# Patient Record
Sex: Male | Born: 1970 | Race: White | Hispanic: No | Smoking: Never smoker
Health system: Southern US, Community
[De-identification: ages and names within clinical notes are randomized; demographics above are authoritative.]

## PROBLEM LIST (undated history)

## (undated) DIAGNOSIS — E119 Type 2 diabetes mellitus without complications: Secondary | ICD-10-CM

## (undated) DIAGNOSIS — I10 Essential (primary) hypertension: Secondary | ICD-10-CM

## (undated) DIAGNOSIS — E785 Hyperlipidemia, unspecified: Secondary | ICD-10-CM

## (undated) DIAGNOSIS — K859 Acute pancreatitis without necrosis or infection, unspecified: Secondary | ICD-10-CM

## (undated) DIAGNOSIS — M459 Ankylosing spondylitis of unspecified sites in spine: Secondary | ICD-10-CM

## (undated) DIAGNOSIS — Z87442 Personal history of urinary calculi: Secondary | ICD-10-CM

## (undated) HISTORY — PX: TONSILLECTOMY: SUR1361

## (undated) HISTORY — PX: VASECTOMY: SHX75

## (undated) HISTORY — PX: VASECTOMY REVERSAL: SHX243

---

## 1994-02-19 DIAGNOSIS — N411 Chronic prostatitis: Secondary | ICD-10-CM | POA: Insufficient documentation

## 2015-11-24 ENCOUNTER — Ambulatory Visit (INDEPENDENT_AMBULATORY_CARE_PROVIDER_SITE_OTHER): Payer: 59 | Admitting: Diagnostic Neuroimaging

## 2015-11-24 ENCOUNTER — Encounter (INDEPENDENT_AMBULATORY_CARE_PROVIDER_SITE_OTHER): Payer: Self-pay | Admitting: Diagnostic Neuroimaging

## 2015-11-24 DIAGNOSIS — Z0289 Encounter for other administrative examinations: Secondary | ICD-10-CM

## 2015-11-24 DIAGNOSIS — M79601 Pain in right arm: Secondary | ICD-10-CM

## 2015-11-24 NOTE — Procedures (Signed)
   [facility]  NCS (NERVE CONDUCTION STUDY) WITH EMG (ELECTROMYOGRAPHY) REPORT   STUDY DATE: 11/24/15 PATIENT NAME: Jonathan Melendez DOB: 07/15/1970 MRN: [id number]  ORDERING CLINICIAN: Andria Frames, MD  TECHNOLOGIST: Laretta Alstrom  ELECTROMYOGRAPHER: Earlean Polka. Penumalli, MD  CLINICAL INFORMATION: 45 year old male with right arm numbness and pain since August 2017. Patient reports numbness and tingling in the right hand digits 1-3. Symptoms are gradually improving. Patient has history of "pinched nerve" in the neck several years ago which improved.   FINDINGS: NERVE CONDUCTION STUDY: Bilateral median and ulnar motor responses and F wave latencies are normal. Bilateral median and ulnar sensory responses are normal.    NEEDLE ELECTROMYOGRAPHY: Needle examination of right upper extremity deltoid, biceps, flexor carpi radialis and first dorsal interosseous muscles is normal. Right triceps examination shows 1+ positive sharp waves and fibrillation potentials with increased insertional activity at rest and normal motor unit recruitment on exertion.  Right C5-6 paraspinal muscles are normal.  Right C7-T1 paraspinal muscles shows increased Associates activity and rare fibrillation potentials and fasciculations.   IMPRESSION:  Mildly abnormal study damaging: 1. Mild evidence of right cervical radiculopathy (C7 root). 2. No evidence of underlying large fiber neuropathy or carpal tunnel syndrome.    INTERPRETING PHYSICIAN:  Penni Bombard, MD Certified in Neurology, Neurophysiology and Neuroimaging  [facility] [address], [place], [place] [address] [phone]

## 2015-12-01 ENCOUNTER — Encounter: Payer: 59 | Admitting: Diagnostic Neuroimaging

## 2016-03-13 DIAGNOSIS — R05 Cough: Secondary | ICD-10-CM | POA: Diagnosis not present

## 2016-05-17 DIAGNOSIS — E119 Type 2 diabetes mellitus without complications: Secondary | ICD-10-CM | POA: Diagnosis not present

## 2016-05-17 DIAGNOSIS — Z7984 Long term (current) use of oral hypoglycemic drugs: Secondary | ICD-10-CM | POA: Diagnosis not present

## 2016-05-17 DIAGNOSIS — E785 Hyperlipidemia, unspecified: Secondary | ICD-10-CM | POA: Diagnosis not present

## 2016-05-17 DIAGNOSIS — I10 Essential (primary) hypertension: Secondary | ICD-10-CM | POA: Diagnosis not present

## 2016-07-30 DIAGNOSIS — E119 Type 2 diabetes mellitus without complications: Secondary | ICD-10-CM | POA: Diagnosis not present

## 2016-07-30 DIAGNOSIS — I10 Essential (primary) hypertension: Secondary | ICD-10-CM | POA: Diagnosis not present

## 2016-07-31 DIAGNOSIS — E119 Type 2 diabetes mellitus without complications: Secondary | ICD-10-CM | POA: Diagnosis not present

## 2016-08-30 DIAGNOSIS — E119 Type 2 diabetes mellitus without complications: Secondary | ICD-10-CM | POA: Diagnosis not present

## 2016-08-30 DIAGNOSIS — I10 Essential (primary) hypertension: Secondary | ICD-10-CM | POA: Diagnosis not present

## 2016-08-30 DIAGNOSIS — R109 Unspecified abdominal pain: Secondary | ICD-10-CM | POA: Diagnosis not present

## 2016-08-30 DIAGNOSIS — E785 Hyperlipidemia, unspecified: Secondary | ICD-10-CM | POA: Diagnosis not present

## 2016-09-04 ENCOUNTER — Other Ambulatory Visit: Payer: Self-pay | Admitting: Family Medicine

## 2016-09-04 ENCOUNTER — Other Ambulatory Visit: Payer: Self-pay

## 2016-09-04 DIAGNOSIS — K859 Acute pancreatitis without necrosis or infection, unspecified: Secondary | ICD-10-CM

## 2016-09-05 ENCOUNTER — Ambulatory Visit
Admission: RE | Admit: 2016-09-05 | Discharge: 2016-09-05 | Disposition: A | Discharge status: Home / Self Care | Payer: 59 | Source: Ambulatory Visit | Attending: Family Medicine | Admitting: Family Medicine

## 2016-09-05 ENCOUNTER — Other Ambulatory Visit: Payer: Self-pay

## 2016-09-05 DIAGNOSIS — K760 Fatty (change of) liver, not elsewhere classified: Secondary | ICD-10-CM | POA: Diagnosis not present

## 2016-09-05 DIAGNOSIS — K449 Diaphragmatic hernia without obstruction or gangrene: Secondary | ICD-10-CM | POA: Diagnosis not present

## 2016-09-05 DIAGNOSIS — K859 Acute pancreatitis without necrosis or infection, unspecified: Secondary | ICD-10-CM

## 2016-09-05 MED ORDER — IOPAMIDOL (ISOVUE-300) INJECTION 61%
100.0000 mL | Freq: Once | INTRAVENOUS | Status: AC | PRN
  Administered 2016-09-05: 100 mL via INTRAVENOUS

## 2016-09-13 DIAGNOSIS — R935 Abnormal findings on diagnostic imaging of other abdominal regions, including retroperitoneum: Secondary | ICD-10-CM | POA: Diagnosis not present

## 2016-09-24 DIAGNOSIS — K5730 Diverticulosis of large intestine without perforation or abscess without bleeding: Secondary | ICD-10-CM | POA: Diagnosis not present

## 2016-09-24 DIAGNOSIS — R933 Abnormal findings on diagnostic imaging of other parts of digestive tract: Secondary | ICD-10-CM | POA: Diagnosis not present

## 2016-11-30 DIAGNOSIS — E785 Hyperlipidemia, unspecified: Secondary | ICD-10-CM | POA: Diagnosis not present

## 2016-11-30 DIAGNOSIS — E119 Type 2 diabetes mellitus without complications: Secondary | ICD-10-CM | POA: Diagnosis not present

## 2016-11-30 DIAGNOSIS — Z125 Encounter for screening for malignant neoplasm of prostate: Secondary | ICD-10-CM | POA: Diagnosis not present

## 2016-11-30 DIAGNOSIS — Z0000 Encounter for general adult medical examination without abnormal findings: Secondary | ICD-10-CM | POA: Diagnosis not present

## 2017-01-01 DIAGNOSIS — I10 Essential (primary) hypertension: Secondary | ICD-10-CM | POA: Diagnosis not present

## 2017-01-01 DIAGNOSIS — E7800 Pure hypercholesterolemia, unspecified: Secondary | ICD-10-CM | POA: Diagnosis not present

## 2017-01-01 DIAGNOSIS — E1165 Type 2 diabetes mellitus with hyperglycemia: Secondary | ICD-10-CM | POA: Diagnosis not present

## 2017-01-21 DIAGNOSIS — D3141 Benign neoplasm of right ciliary body: Secondary | ICD-10-CM | POA: Diagnosis not present

## 2017-01-21 DIAGNOSIS — D3132 Benign neoplasm of left choroid: Secondary | ICD-10-CM | POA: Diagnosis not present

## 2017-01-21 DIAGNOSIS — E119 Type 2 diabetes mellitus without complications: Secondary | ICD-10-CM | POA: Diagnosis not present

## 2017-04-11 DIAGNOSIS — I10 Essential (primary) hypertension: Secondary | ICD-10-CM | POA: Diagnosis not present

## 2017-04-11 DIAGNOSIS — E7800 Pure hypercholesterolemia, unspecified: Secondary | ICD-10-CM | POA: Diagnosis not present

## 2017-04-11 DIAGNOSIS — E1165 Type 2 diabetes mellitus with hyperglycemia: Secondary | ICD-10-CM | POA: Diagnosis not present

## 2017-05-22 DIAGNOSIS — M25511 Pain in right shoulder: Secondary | ICD-10-CM | POA: Diagnosis not present

## 2017-05-23 DIAGNOSIS — M25512 Pain in left shoulder: Secondary | ICD-10-CM | POA: Diagnosis not present

## 2017-05-23 DIAGNOSIS — M25511 Pain in right shoulder: Secondary | ICD-10-CM | POA: Diagnosis not present

## 2017-05-23 DIAGNOSIS — M7542 Impingement syndrome of left shoulder: Secondary | ICD-10-CM | POA: Diagnosis not present

## 2017-06-24 DIAGNOSIS — M7541 Impingement syndrome of right shoulder: Secondary | ICD-10-CM | POA: Diagnosis not present

## 2017-06-24 DIAGNOSIS — M7542 Impingement syndrome of left shoulder: Secondary | ICD-10-CM | POA: Diagnosis not present

## 2017-06-24 DIAGNOSIS — S46912D Strain of unspecified muscle, fascia and tendon at shoulder and upper arm level, left arm, subsequent encounter: Secondary | ICD-10-CM | POA: Diagnosis not present

## 2017-06-24 DIAGNOSIS — S46912A Strain of unspecified muscle, fascia and tendon at shoulder and upper arm level, left arm, initial encounter: Secondary | ICD-10-CM | POA: Insufficient documentation

## 2017-06-28 DIAGNOSIS — M25512 Pain in left shoulder: Secondary | ICD-10-CM | POA: Diagnosis not present

## 2017-07-05 DIAGNOSIS — S46912D Strain of unspecified muscle, fascia and tendon at shoulder and upper arm level, left arm, subsequent encounter: Secondary | ICD-10-CM | POA: Diagnosis not present

## 2017-07-05 DIAGNOSIS — M7502 Adhesive capsulitis of left shoulder: Secondary | ICD-10-CM | POA: Diagnosis not present

## 2017-07-23 DIAGNOSIS — S46912D Strain of unspecified muscle, fascia and tendon at shoulder and upper arm level, left arm, subsequent encounter: Secondary | ICD-10-CM | POA: Diagnosis not present

## 2017-07-26 DIAGNOSIS — S46912D Strain of unspecified muscle, fascia and tendon at shoulder and upper arm level, left arm, subsequent encounter: Secondary | ICD-10-CM | POA: Diagnosis not present

## 2017-07-30 DIAGNOSIS — S46912D Strain of unspecified muscle, fascia and tendon at shoulder and upper arm level, left arm, subsequent encounter: Secondary | ICD-10-CM | POA: Diagnosis not present

## 2017-08-02 DIAGNOSIS — S46912D Strain of unspecified muscle, fascia and tendon at shoulder and upper arm level, left arm, subsequent encounter: Secondary | ICD-10-CM | POA: Diagnosis not present

## 2017-08-07 DIAGNOSIS — S46912D Strain of unspecified muscle, fascia and tendon at shoulder and upper arm level, left arm, subsequent encounter: Secondary | ICD-10-CM | POA: Diagnosis not present

## 2017-08-14 DIAGNOSIS — S46912D Strain of unspecified muscle, fascia and tendon at shoulder and upper arm level, left arm, subsequent encounter: Secondary | ICD-10-CM | POA: Diagnosis not present

## 2017-08-14 DIAGNOSIS — M7502 Adhesive capsulitis of left shoulder: Secondary | ICD-10-CM | POA: Diagnosis not present

## 2017-08-16 DIAGNOSIS — S46912D Strain of unspecified muscle, fascia and tendon at shoulder and upper arm level, left arm, subsequent encounter: Secondary | ICD-10-CM | POA: Diagnosis not present

## 2017-08-21 DIAGNOSIS — S46912D Strain of unspecified muscle, fascia and tendon at shoulder and upper arm level, left arm, subsequent encounter: Secondary | ICD-10-CM | POA: Diagnosis not present

## 2017-11-01 DIAGNOSIS — Z0000 Encounter for general adult medical examination without abnormal findings: Secondary | ICD-10-CM | POA: Diagnosis not present

## 2017-11-01 DIAGNOSIS — Z23 Encounter for immunization: Secondary | ICD-10-CM | POA: Diagnosis not present

## 2017-11-01 DIAGNOSIS — E785 Hyperlipidemia, unspecified: Secondary | ICD-10-CM | POA: Diagnosis not present

## 2017-11-04 DIAGNOSIS — I10 Essential (primary) hypertension: Secondary | ICD-10-CM | POA: Diagnosis not present

## 2017-11-04 DIAGNOSIS — E1165 Type 2 diabetes mellitus with hyperglycemia: Secondary | ICD-10-CM | POA: Diagnosis not present

## 2017-11-04 DIAGNOSIS — E7800 Pure hypercholesterolemia, unspecified: Secondary | ICD-10-CM | POA: Diagnosis not present

## 2017-11-18 DIAGNOSIS — S46912D Strain of unspecified muscle, fascia and tendon at shoulder and upper arm level, left arm, subsequent encounter: Secondary | ICD-10-CM | POA: Diagnosis not present

## 2017-11-18 DIAGNOSIS — M7502 Adhesive capsulitis of left shoulder: Secondary | ICD-10-CM | POA: Diagnosis not present

## 2017-11-18 DIAGNOSIS — M7500 Adhesive capsulitis of unspecified shoulder: Secondary | ICD-10-CM | POA: Insufficient documentation

## 2017-12-24 DIAGNOSIS — G43009 Migraine without aura, not intractable, without status migrainosus: Secondary | ICD-10-CM | POA: Diagnosis not present

## 2017-12-24 DIAGNOSIS — E162 Hypoglycemia, unspecified: Secondary | ICD-10-CM | POA: Diagnosis not present

## 2018-02-25 DIAGNOSIS — R5382 Chronic fatigue, unspecified: Secondary | ICD-10-CM | POA: Diagnosis not present

## 2018-02-25 DIAGNOSIS — M459 Ankylosing spondylitis of unspecified sites in spine: Secondary | ICD-10-CM | POA: Diagnosis not present

## 2018-03-14 DIAGNOSIS — E7800 Pure hypercholesterolemia, unspecified: Secondary | ICD-10-CM | POA: Diagnosis not present

## 2018-03-14 DIAGNOSIS — E1165 Type 2 diabetes mellitus with hyperglycemia: Secondary | ICD-10-CM | POA: Diagnosis not present

## 2018-03-14 DIAGNOSIS — I10 Essential (primary) hypertension: Secondary | ICD-10-CM | POA: Diagnosis not present

## 2018-03-19 DIAGNOSIS — M461 Sacroiliitis, not elsewhere classified: Secondary | ICD-10-CM | POA: Diagnosis not present

## 2018-03-19 DIAGNOSIS — M2550 Pain in unspecified joint: Secondary | ICD-10-CM | POA: Diagnosis not present

## 2018-03-19 DIAGNOSIS — M450 Ankylosing spondylitis of multiple sites in spine: Secondary | ICD-10-CM | POA: Diagnosis not present

## 2018-05-02 ENCOUNTER — Encounter: Payer: Self-pay | Admitting: Cardiovascular Disease

## 2018-05-02 DIAGNOSIS — I10 Essential (primary) hypertension: Secondary | ICD-10-CM | POA: Diagnosis not present

## 2018-05-02 DIAGNOSIS — E785 Hyperlipidemia, unspecified: Secondary | ICD-10-CM | POA: Diagnosis not present

## 2018-05-02 DIAGNOSIS — E119 Type 2 diabetes mellitus without complications: Secondary | ICD-10-CM | POA: Diagnosis not present

## 2018-06-10 DIAGNOSIS — M2550 Pain in unspecified joint: Secondary | ICD-10-CM | POA: Diagnosis not present

## 2018-06-10 DIAGNOSIS — M461 Sacroiliitis, not elsewhere classified: Secondary | ICD-10-CM | POA: Diagnosis not present

## 2018-06-10 DIAGNOSIS — M450 Ankylosing spondylitis of multiple sites in spine: Secondary | ICD-10-CM | POA: Diagnosis not present

## 2018-07-22 ENCOUNTER — Other Ambulatory Visit: Payer: Self-pay | Admitting: Internal Medicine

## 2018-07-22 DIAGNOSIS — M542 Cervicalgia: Secondary | ICD-10-CM

## 2018-08-08 ENCOUNTER — Other Ambulatory Visit: Payer: 59

## 2019-05-09 ENCOUNTER — Ambulatory Visit: Payer: Self-pay | Attending: Internal Medicine

## 2019-05-09 DIAGNOSIS — Z23 Encounter for immunization: Secondary | ICD-10-CM

## 2019-05-09 NOTE — Progress Notes (Signed)
   Covid-19 Vaccination Clinic  Name:  Jonathan Melendez    MRN: [id number] DOB: Jun 30, 1970  05/09/2019  Mr. Jonathan Melendez was observed post Covid-19 immunization for 15 minutes without incident. He was provided with Vaccine Information Sheet and instruction to access the V-Safe system.   Mr. Jonathan Melendez was instructed to call 911 with any severe reactions post vaccine: Marland Kitchen Difficulty breathing  . Swelling of face and throat  . A fast heartbeat  . A bad rash all over body  . Dizziness and weakness   Immunizations Administered    Name Date Dose VIS Date Route   Moderna COVID-19 Vaccine 05/09/2019  9:25 AM 0.5 mL 01/20/2019 Intramuscular   Manufacturer: Moderna   Lot: [id number]   [id number]

## 2019-06-10 ENCOUNTER — Ambulatory Visit: Payer: Self-pay | Attending: Internal Medicine

## 2019-06-10 DIAGNOSIS — Z23 Encounter for immunization: Secondary | ICD-10-CM

## 2019-06-10 NOTE — Progress Notes (Signed)
   Covid-19 Vaccination Clinic  Name:  Jonathan Melendez    MRN: [id number] DOB: 01/30/1971  06/10/2019  Mr. [name] was observed post Covid-19 immunization for 15 minutes without incident. He was provided with Vaccine Information Sheet and instruction to access the V-Safe system.   Mr. [name] was instructed to call 911 with any severe reactions post vaccine: Marland Kitchen Difficulty breathing  . Swelling of face and throat  . A fast heartbeat  . A bad rash all over body  . Dizziness and weakness   Immunizations Administered    Name Date Dose VIS Date Route   Moderna COVID-19 Vaccine 06/10/2019  8:38 AM 0.5 mL 01/2019 Intramuscular   Manufacturer: Moderna   Lot: [id number]   [id number]

## 2019-07-06 ENCOUNTER — Encounter (HOSPITAL_COMMUNITY): Payer: Self-pay

## 2019-07-06 ENCOUNTER — Other Ambulatory Visit: Payer: Self-pay

## 2019-07-06 ENCOUNTER — Emergency Department (HOSPITAL_COMMUNITY): Payer: 59

## 2019-07-06 ENCOUNTER — Emergency Department (HOSPITAL_COMMUNITY)
Admission: EM | Admit: 2019-07-06 | Discharge: 2019-07-06 | Disposition: A | Discharge status: Home / Self Care | Payer: 59 | Attending: Emergency Medicine | Admitting: Emergency Medicine

## 2019-07-06 DIAGNOSIS — I10 Essential (primary) hypertension: Secondary | ICD-10-CM | POA: Insufficient documentation

## 2019-07-06 DIAGNOSIS — R739 Hyperglycemia, unspecified: Secondary | ICD-10-CM

## 2019-07-06 DIAGNOSIS — R519 Headache, unspecified: Secondary | ICD-10-CM

## 2019-07-06 DIAGNOSIS — R0789 Other chest pain: Secondary | ICD-10-CM | POA: Insufficient documentation

## 2019-07-06 DIAGNOSIS — Z7984 Long term (current) use of oral hypoglycemic drugs: Secondary | ICD-10-CM | POA: Diagnosis not present

## 2019-07-06 DIAGNOSIS — E1165 Type 2 diabetes mellitus with hyperglycemia: Secondary | ICD-10-CM | POA: Insufficient documentation

## 2019-07-06 DIAGNOSIS — Z79899 Other long term (current) drug therapy: Secondary | ICD-10-CM | POA: Insufficient documentation

## 2019-07-06 DIAGNOSIS — R079 Chest pain, unspecified: Secondary | ICD-10-CM

## 2019-07-06 HISTORY — DX: Type 2 diabetes mellitus without complications: E11.9

## 2019-07-06 HISTORY — DX: Essential (primary) hypertension: I10

## 2019-07-06 LAB — CBC
HCT: 51.5 % (ref 39.0–52.0)
Hemoglobin: 17.5 g/dL — ABNORMAL HIGH (ref 13.0–17.0)
MCH: 29.7 pg (ref 26.0–34.0)
MCHC: 34 g/dL (ref 30.0–36.0)
MCV: 87.3 fL (ref 80.0–100.0)
Platelets: 214 K/uL (ref 150–400)
RBC: 5.9 MIL/uL — ABNORMAL HIGH (ref 4.22–5.81)
RDW: 12 % (ref 11.5–15.5)
WBC: 7.5 K/uL (ref 4.0–10.5)
nRBC: 0 % (ref 0.0–0.2)

## 2019-07-06 LAB — BASIC METABOLIC PANEL WITH GFR
Anion gap: 16 — ABNORMAL HIGH (ref 5–15)
BUN: 8 mg/dL (ref 6–20)
CO2: 22 mmol/L (ref 22–32)
Calcium: 9.4 mg/dL (ref 8.9–10.3)
Chloride: 101 mmol/L (ref 98–111)
Creatinine, Ser: 1.06 mg/dL (ref 0.61–1.24)
GFR calc Af Amer: >60 mL/min (ref >60)
GFR calc non Af Amer: >60 mL/min (ref >60)
Glucose, Bld: 278 mg/dL — ABNORMAL HIGH (ref 70–99)
Potassium: 4.1 mmol/L (ref 3.5–5.1)
Sodium: 139 mmol/L (ref 135–145)

## 2019-07-06 LAB — TROPONIN I (HIGH SENSITIVITY)
Troponin I (High Sensitivity): 6 ng/L (ref <18)
Troponin I (High Sensitivity): 5 ng/L (ref <18)

## 2019-07-06 MED ORDER — PROCHLORPERAZINE EDISYLATE 10 MG/2ML IJ SOLN
10.0000 mg | Freq: Once | INTRAMUSCULAR | Status: AC
  Administered 2019-07-06: 10 mg via INTRAVENOUS
  Filled 2019-07-06: qty 2

## 2019-07-06 MED ORDER — SODIUM CHLORIDE 0.9 % IV BOLUS
1000.0000 mL | Freq: Once | INTRAVENOUS | Status: AC
  Administered 2019-07-06: 1000 mL via INTRAVENOUS

## 2019-07-06 MED ORDER — DIPHENHYDRAMINE HCL 50 MG/ML IJ SOLN
12.5000 mg | Freq: Once | INTRAMUSCULAR | Status: AC
  Administered 2019-07-06: 12.5 mg via INTRAVENOUS
  Filled 2019-07-06: qty 1

## 2019-07-06 NOTE — Discharge Instructions (Addendum)
You were seen in the emergency department today for a headache & chest pain. Your work-up in the emergency department has been overall reassuring. Your labs have been fairly normal and or similar to previous blood work you have had done.-Your blood sugar was noted to be elevated greater than 200, please be sure to monitor this at home and have it rechecked by your primary care provider.  Your EKG and the enzyme we use to check your heart did not show an acute heart attack at this time. Your chest x-ray was normal.  CT of your head was normal.  Please take Motrin or Tylenol per over-the-counter dosing to help should pain return.  We would like you to follow up closely with your primary care provider and/or the cardiologist provided in your discharge instructions within 1-3 days. Return to the ER immediately should you experience any new or worsening symptoms including but not limited to return of pain, worsened pain, vomiting, shortness of breath, dizziness, lightheadedness, passing out, or any other concerns that you may have.

## 2019-07-06 NOTE — ED Triage Notes (Signed)
Pt c.o chest pressure for the past few months but worsening over the past few days along with headache. Pt a.o, nad noted

## 2019-07-06 NOTE — ED Provider Notes (Signed)
[facility] EMERGENCY DEPARTMENT Provider Note   CSN: [id number] Arrival date & time: 07/06/19  [id number]     History Chief Complaint  Patient presents with  . Chest Pain  . Headache    Jonathan Melendez is a 49 y.o. male with a history of hypertension & DM who presents to the ED with complaints of intermittent chest pain and headache for the past 2 to 3 weeks.  Patient states chest discomfort occurs almost daily, it is described as a pressure to the central chest that occurs intermittently, typically lasting about 1 hour at a time, no specific alleviating or aggravating factors.  No change with food, exertion, or deep breathing.  Headache has been occurring almost daily, states he has gradual onset steady progression of discomfort to the generalized frontal/back of the head.  When the pain gets really bad he does have some mild blurry vision in both of his eyes, is not persistent.  Discomfort is mildly alleviated with aspirin.  No other alleviating or rating factors.  Patient denies fever, nausea, vomiting, diaphoresis, dyspnea, syncope, leg pain/swelling, hemoptysis, recent surgery/trauma, recent long travel, hormone use, personal hx of cancer, or hx of DVT/PE.  Family history of CAD, his father had a heart attack at age 40.     HPI     Past Medical History:  Diagnosis Date  . Diabetes mellitus without complication ([place])   . Hypertension     There are no problems to display for this patient.   History reviewed. No pertinent surgical history.     No family history on file.  Social History   Tobacco Use  . Smoking status: Not on file  Substance Use Topics  . Alcohol use: Not on file  . Drug use: Not on file    Home Medications Prior to Admission medications   Not on File    Allergies    Patient has no allergy information on record.  Review of Systems   Review of Systems  Constitutional: Negative for chills and fever.  Eyes: Positive for visual  disturbance (mild intermittent blurry vision).  Respiratory: Negative for cough and shortness of breath.   Cardiovascular: Positive for chest pain.  Gastrointestinal: Negative for abdominal pain, nausea and vomiting.  Neurological: Positive for headaches. Negative for dizziness, syncope, facial asymmetry, speech difficulty, weakness and numbness.  All other systems reviewed and are negative.   Physical Exam Updated Vital Signs BP (!) 173/103   Pulse 99   Temp 98 F (36.7 C) (Oral)   Resp 16   Ht 5\' 8"  (1.727 m)   Wt 86.2 kg   SpO2 99%   BMI 28.89 kg/m   Physical Exam Vitals and nursing note reviewed.  Constitutional:      General: He is not in acute distress.    Appearance: Normal appearance. He is not toxic-appearing.  HENT:     Head: Normocephalic and atraumatic.     Mouth/Throat:     Pharynx: Oropharynx is clear. Uvula midline.  Eyes:     General: Vision grossly intact. Gaze aligned appropriately.     Extraocular Movements: Extraocular movements intact.     Conjunctiva/sclera: Conjunctivae normal.     Pupils: Pupils are equal, round, and reactive to light.     Comments: No proptosis.   Cardiovascular:     Rate and Rhythm: Normal rate and regular rhythm.     Pulses:          Radial pulses are 2+ on the right  side and 2+ on the left side.  Pulmonary:     Effort: Pulmonary effort is normal.     Breath sounds: Normal breath sounds.  Abdominal:     General: There is no distension.     Palpations: Abdomen is soft.     Tenderness: There is no abdominal tenderness. There is no guarding or rebound.  Musculoskeletal:     Cervical back: Normal range of motion and neck supple. No rigidity.     Right lower leg: No tenderness. No edema.     Left lower leg: No tenderness. No edema.  Skin:    General: Skin is warm and dry.  Neurological:     Mental Status: He is alert.     Comments: Alert. Clear speech. No facial droop. CNIII-XII grossly intact. Bilateral upper and lower  extremities' sensation grossly intact. 5/5 symmetric strength with grip strength and with plantar and dorsi flexion bilaterally . Normal finger to nose bilaterally. Negative pronator drift. Gait intact.    Psychiatric:        Mood and Affect: Mood normal.        Behavior: Behavior normal.     ED Results / Procedures / Treatments   Labs (all labs ordered are listed, but only abnormal results are displayed) Labs Reviewed  BASIC METABOLIC PANEL - Abnormal; Notable for the following components:      Result Value   Glucose, Bld 278 (*)    Anion gap 16 (*)    All other components within normal limits  CBC - Abnormal; Notable for the following components:   RBC 5.90 (*)    Hemoglobin 17.5 (*)    All other components within normal limits  TROPONIN I (HIGH SENSITIVITY)  TROPONIN I (HIGH SENSITIVITY)    EKG EKG Interpretation  Date/Time:  Monday Jul 06 2019 08:36:58 EDT Ventricular Rate:  95 PR Interval:  136 QRS Duration: 92 QT Interval:  368 QTC Calculation: 462 R Axis:   106 Text Interpretation: Normal sinus rhythm Rightward axis Borderline ECG Confirmed by Lennice Sites [phone]) on 07/06/2019 10:05:11 AM   Radiology DG Chest 2 View  Result Date: 07/06/2019 CLINICAL DATA:  Chest pain EXAM: CHEST - 2 VIEW COMPARISON:  None. FINDINGS: The heart size and mediastinal contours are within normal limits. Both lungs are clear. No pleural effusion or pneumothorax. The visualized skeletal structures are unremarkable. IMPRESSION: No acute process in the chest. Electronically Signed   By: Macy Mis M.D.   On: 07/06/2019 09:22   CT Head Wo Contrast  Result Date: 07/06/2019 CLINICAL DATA:  Headaches and head pressure for several weeks, no injuries, diabetes mellitus, hypertension EXAM: CT HEAD WITHOUT CONTRAST TECHNIQUE: Contiguous axial images were obtained from the base of the skull through the vertex without intravenous contrast. Sagittal and coronal MPR images reconstructed from axial  data set. COMPARISON:  None. FINDINGS: Brain: Normal ventricular morphology. No midline shift or mass effect. Normal appearance of brain parenchyma. No intracranial hemorrhage, mass lesion, evidence of acute infarction, or extra-axial fluid collection. Vascular: No hyperdense vessels Skull: Intact Sinuses/Orbits: Clear Other: N/A IMPRESSION: Normal exam. Electronically Signed   By: Lavonia Dana M.D.   On: 07/06/2019 11:47    Procedures Procedures (including critical care time)  Medications Ordered in ED Medications  prochlorperazine (COMPAZINE) injection 10 mg (10 mg Intravenous Given 07/06/19 1047)  diphenhydrAMINE (BENADRYL) injection 12.5 mg (12.5 mg Intravenous Given 07/06/19 1048)  sodium chloride 0.9 % bolus 1,000 mL (0 mLs Intravenous Stopped 07/06/19 1224)  ED Course  I have reviewed the triage vital signs and the nursing notes.  Pertinent labs & imaging results that were available during my care of the patient were reviewed by me and considered in my medical decision making (see chart for details).    MDM Rules/Calculators/A&P                     Patient presents to the ED with complaints of chest discomfort & headache intermittently for the past few weesk. Nontoxic, vitals WNL with the exception of elevated BP which is improved on my initial assessment to 140s/90s. Benign physical exam.    Additional history obtained:  Additional history obtained from review of chart & nursing noted.  EKG: Normal sinus rhythm, right axis deviation, no STEMI. Lab Tests:  I Ordered, reviewed, and interpreted labs, which included:  CBC: No anemia.  Elevated hemoglobin-possibly hemoconcentrated.  No leukocytosis. BMP: Hyperglycemia with mildly elevated anion gap, no acidosis. Troponin:6, 5 Imaging Studies ordered:  I ordered imaging studies which included CXR & head CT, I independently visualized and interpreted imaging which showed: CXR:  No acute process in the chest.  CT head : normal exam.    ED Course:  10:15: Initial evaluation of the patient, he is resting comfortably, benign physical exam, states the headache is bothering him more than the chest at this time, will trial migraine cocktail.  13:10: RE-EVAL: Patient is feeling much better.   HEAR score 4-EKG without significant ischemic changes, troponins are flat, symptoms for a couple of weeks now, low suspicion for ACS at this time.  Patient is low risk Wells, doubt pulmonary embolism.  Symmetric pulses, no widened mediastinum on chest x-ray, doubt dissection.  No critical anemia or electrolyte derangement.  Normal chest x-ray.  Overall reassuring chest pain work-up.  Given his family history and risk factors do feel he would benefit from close cardiology follow-up.  Regarding his headache: Given no history of similar headaches in age of 49 years old a CT of the head was obtained and negative for acute process.  Headaches have had gradual onset with steady progression, he has had no focal neurologic deficits on exam, vision is grossly intact, he is afebrile, no nuchal rigidity-overall low suspicion for [facility], ICH, ischemic CVA, dural venous sinus thrombosis, acute glaucoma, giant cell arteritis, mass, or meningitis.  He is feeling improved status post migraine cocktail.    His blood sugar was also noted to be elevated with a mildly elevated anion gap, no acidosis noted, do not suspect DKA.  He was given fluids for this in the ER. We will have him follow-up closely with his primary care provider.  I discussed results, treatment plan, need for follow-up, and return precautions with the patient. Provided opportunity for questions, patient confirmed understanding and is in agreement with plan.   Portions of this note were generated with Lobbyist. Dictation errors may occur despite best attempts at proofreading.  Final Clinical Impression(s) / ED Diagnoses Final diagnoses:  Hyperglycemia  Chest pain, unspecified type   Nonintractable headache, unspecified chronicity pattern, unspecified headache type    Rx / DC Orders ED Discharge Orders    None       Amaryllis Dyke, PA-C 07/06/19 1321    Lennice Sites, DO 07/06/19 1456

## 2019-07-24 ENCOUNTER — Encounter: Payer: Self-pay | Admitting: Cardiovascular Disease

## 2019-07-24 ENCOUNTER — Ambulatory Visit (INDEPENDENT_AMBULATORY_CARE_PROVIDER_SITE_OTHER): Payer: 59 | Admitting: Cardiovascular Disease

## 2019-07-24 ENCOUNTER — Other Ambulatory Visit: Payer: Self-pay

## 2019-07-24 VITALS — BP 130/84 | HR 51 | Ht 68.0 in | Wt 188.6 lb

## 2019-07-24 DIAGNOSIS — Z8249 Family history of ischemic heart disease and other diseases of the circulatory system: Secondary | ICD-10-CM

## 2019-07-24 DIAGNOSIS — E782 Mixed hyperlipidemia: Secondary | ICD-10-CM

## 2019-07-24 DIAGNOSIS — R072 Precordial pain: Secondary | ICD-10-CM | POA: Diagnosis not present

## 2019-07-24 DIAGNOSIS — R079 Chest pain, unspecified: Secondary | ICD-10-CM | POA: Diagnosis not present

## 2019-07-24 DIAGNOSIS — Z8639 Personal history of other endocrine, nutritional and metabolic disease: Secondary | ICD-10-CM

## 2019-07-24 DIAGNOSIS — I10 Essential (primary) hypertension: Secondary | ICD-10-CM | POA: Diagnosis not present

## 2019-07-24 DIAGNOSIS — E785 Hyperlipidemia, unspecified: Secondary | ICD-10-CM | POA: Insufficient documentation

## 2019-07-24 MED ORDER — NITROGLYCERIN 0.4 MG SL SUBL: 0.4000 mg | SUBLINGUAL_TABLET | SUBLINGUAL | 3 refills | Status: DC | PRN

## 2019-07-24 NOTE — Progress Notes (Signed)
07/24/2019 Jonathan Melendez   Nov 01, 1970  [id number]  Primary Physician London Pepper, MD Primary Cardiologist: Lorretta Harp MD Lupe Carney, [place]  HPI:  Jonathan Melendez is a 49 y.o. thin appearing divorced Caucasian male father of one deceased daughter referred by Dr. Orland Mustard for cardiovascular valuation because of chest pain.  He works at [employer].  His cardiac risk factors are notable for treated hypertension, diabetes and hyperlipidemia.  His father died of a myocardial infarction at age 24.  He is never had a heart attack or stroke.  Does have a history of ankylosing spondylitis.  He was seen in the emergency room 07/06/2019 with chest pain and headache.  His work-up was unrevealing.  He had had symptoms for 2 weeks prior to that which were less severe and several episodes since that time.  The pain occurs randomly, last for minutes at a time and radiates to his left shoulder with some shortness of breath as well.   Current Meds  Medication Sig  . atorvastatin (LIPITOR) 20 MG tablet Take 20 mg by mouth daily.  Marland Kitchen glimepiride (AMARYL) 2 MG tablet Take 2 mg by mouth 2 (two) times daily.  . insulin degludec (TRESIBA FLEXTOUCH) 100 UNIT/ML FlexTouch Pen Inject 8-10 mLs into the skin daily.   . metFORMIN (GLUCOPHAGE-XR) 500 MG 24 hr tablet Take 500 mg by mouth in the morning and at bedtime.   . metoprolol succinate (TOPROL-XL) 50 MG 24 hr tablet Take 50 mg by mouth daily.     No Known Allergies  Social History   Socioeconomic History  . Marital status: Married    Spouse name: Not on file  . Number of children: Not on file  . Years of education: Not on file  . Highest education level: Not on file  Occupational History  . Not on file  Tobacco Use  . Smoking status: Never Smoker  . Smokeless tobacco: Never Used  Substance and Sexual Activity  . Alcohol use: Not on file  . Drug use: Not on file  . Sexual activity: Not on file  Other Topics  Concern  . Not on file  Social History Narrative  . Not on file   Social Determinants of Health   Financial Resource Strain:   . Difficulty of Paying Living Expenses:   Food Insecurity:   . Worried About Charity fundraiser in the Last Year:   . Arboriculturist in the Last Year:   Transportation Needs:   . Film/video editor (Medical):   Marland Kitchen Lack of Transportation (Non-Medical):   Physical Activity:   . Days of Exercise per Week:   . Minutes of Exercise per Session:   Stress:   . Feeling of Stress :   Social Connections:   . Frequency of Communication with Friends and Family:   . Frequency of Social Gatherings with Friends and Family:   . Attends Religious Services:   . Active Member of Clubs or Organizations:   . Attends Archivist Meetings:   Marland Kitchen Marital Status:   Intimate Partner Violence:   . Fear of Current or Ex-Partner:   . Emotionally Abused:   Marland Kitchen Physically Abused:   . Sexually Abused:      Review of Systems: General: negative for chills, fever, night sweats or weight changes.  Cardiovascular: negative for chest pain, dyspnea on exertion, edema, orthopnea, palpitations, paroxysmal nocturnal dyspnea or shortness of breath Dermatological: negative for rash Respiratory: negative  for cough or wheezing Urologic: negative for hematuria Abdominal: negative for nausea, vomiting, diarrhea, bright red blood per rectum, melena, or hematemesis Neurologic: negative for visual changes, syncope, or dizziness All other systems reviewed and are otherwise negative except as noted above.    Blood pressure 130/84, pulse (!) 51, height 5\' 8"  (1.727 m), weight 188 lb 9.6 oz (85.5 kg), SpO2 97 %.  General appearance: alert and no distress Neck: no adenopathy, no carotid bruit, no JVD, supple, symmetrical, trachea midline and thyroid not enlarged, symmetric, no tenderness/mass/nodules Lungs: clear to auscultation bilaterally Heart: regular rate and rhythm, S1, S2 normal,  no murmur, click, rub or gallop Extremities: extremities normal, atraumatic, no cyanosis or edema Pulses: 2+ and symmetric Skin: Skin color, texture, turgor normal. No rashes or lesions Neurologic: Alert and oriented X 3, normal strength and tone. Normal symmetric reflexes. Normal coordination and gait  EKG not performed today  ASSESSMENT AND PLAN:   Essential hypertension History of essential potential blood pressure measured today 130/84.  He is on metoprolol.  Hyperlipidemia History of hyperlipidemia on statin therapy followed by his PCP  Chest pain of uncertain etiology Mr. Tousley was recently seen in the ER 07/06/2019 with chest pain and headaches.  His work-up was unrevealing.  He had had symptoms that began 2 weeks prior to that and has had several episodes since that which were less severe.  The episodes last several minutes at a time and occasionally radiate to the left shoulder.  They are associated with shortness of breath.  He does have a family history for heart disease with a father who had a myocardial infarction at age 105.  He has treated hypertension, diabetes and hyperlipidemia.  I am going to get a 2D echo and a coronary CTA on him.  See him back after that for further evaluation.      Lorretta Harp MD FACP,FACC,FAHA, [facility] 07/24/2019 3:53 PM

## 2019-07-24 NOTE — Assessment & Plan Note (Signed)
History of essential potential blood pressure measured today 130/84.  He is on metoprolol.

## 2019-07-24 NOTE — Assessment & Plan Note (Signed)
History of hyperlipidemia on statin therapy followed by his PCP 

## 2019-07-24 NOTE — Assessment & Plan Note (Signed)
Jonathan Melendez was recently seen in the ER 07/06/2019 with chest pain and headaches.  His work-up was unrevealing.  He had had symptoms that began 2 weeks prior to that and has had several episodes since that which were less severe.  The episodes last several minutes at a time and occasionally radiate to the left shoulder.  They are associated with shortness of breath.  He does have a family history for heart disease with a father who had a myocardial infarction at age 68.  He has treated hypertension, diabetes and hyperlipidemia.  I am going to get a 2D echo and a coronary CTA on him.  See him back after that for further evaluation.

## 2019-07-24 NOTE — Patient Instructions (Addendum)
Medication Instructions:  Your physician recommends that you continue on your current medications as directed. Please refer to the Current Medication list given to you today.  Dr. Gwenlyn Found has prescribed nitroglycerin to use as needed for chest pain   *If you need a refill on your cardiac medications before your next appointment, please call your pharmacy*   Lab Work: BMET - 1 week prior to CT test  If you have labs (blood work) drawn today and your tests are completely normal, you will receive your results only by: Marland Kitchen MyChart Message (if you have MyChart) OR . A paper copy in the mail If you have any lab test that is abnormal or we need to change your treatment, we will call you to review the results.   Testing/Procedures: Echocardiogram @ [facility] - schedule ASAP Coronary CT @ [facility]   Follow-Up: At [facility], you and your health needs are our priority.  As part of our continuing mission to provide you with exceptional heart care, we have created designated Provider Care Teams.  These Care Teams include your primary Cardiologist (physician) and Advanced Practice Providers (APPs -  Physician Assistants and Nurse Practitioners) who all work together to provide you with the care you need, when you need it.  We recommend signing up for the patient portal called "MyChart".  Sign up information is provided on this After Visit Summary.  MyChart is used to connect with patients for Virtual Visits (Telemedicine).  Patients are able to view lab/test results, encounter notes, upcoming appointments, etc.  Non-urgent messages can be sent to your provider as well.   To learn more about what you can do with MyChart, go to [email or web address].    Your next appointment:   ASAP after testing with Dr. Gwenlyn Found    Other Instructions  Your cardiac CT will be scheduled at one of the below locations:   [facility] [address] [place], [place] [address] [phone]  [place] [address] [place], [place] [address] [phone]  If scheduled at [facility], please arrive at the [facility] main entrance of [facility] 30 minutes prior to test start time. Proceed to the [facility] Radiology Department (first floor) to check-in and test prep.  If scheduled at [facility], please arrive 15 mins early for check-in and test prep.  Please follow these instructions carefully (unless otherwise directed):  Hold all erectile dysfunction medications at least 3 days (72 hrs) prior to test.  On the Night Before the Test: . Be sure to Drink plenty of water. . Do not consume any caffeinated/decaffeinated beverages or chocolate 12 hours prior to your test. . Do not take any antihistamines 12 hours prior to your test. . If you take Metformin do not take 24 hours prior to test.   On the Day of the Test: . Drink plenty of water. Do not drink any water within one hour of the test. . Do not eat any food 4 hours prior to the test. . You may take your regular medications prior to the test.       After the Test: . Drink plenty of water. . After receiving IV contrast, you may experience a mild flushed feeling. This is normal. . On occasion, you may experience a mild rash up to 24 hours after the test. This is not dangerous. If this occurs, you can take Benadryl 25 mg and increase  your fluid intake. . If you experience trouble breathing, this can be serious. If it is severe call 911 IMMEDIATELY. If it is mild, please call our office. . If you take any of these medications: Glipizide/Metformin, Avandament, Glucavance, please do not take 48 hours after completing test unless otherwise instructed.   Once we have confirmed authorization from your insurance company, we will call you to set up a date and time for your test.   For non-scheduling related questions,  please contact the cardiac imaging nurse navigator should you have any questions/concerns: Marchia Bond, Cardiac Imaging Nurse Navigator Burley Saver, Interim Cardiac Imaging Nurse [facility] Direct Office Dial: [phone]   For scheduling needs, including cancellations and rescheduling, please call [phone].

## 2019-07-27 ENCOUNTER — Ambulatory Visit (HOSPITAL_COMMUNITY)
Admission: RE | Admit: 2019-07-27 | Discharge: 2019-07-27 | Disposition: A | Discharge status: Home / Self Care | Payer: 59 | Source: Ambulatory Visit | Attending: Cardiovascular Disease | Admitting: Cardiovascular Disease

## 2019-07-27 ENCOUNTER — Other Ambulatory Visit: Payer: Self-pay

## 2019-07-27 DIAGNOSIS — I10 Essential (primary) hypertension: Secondary | ICD-10-CM | POA: Diagnosis present

## 2019-07-27 DIAGNOSIS — Z8249 Family history of ischemic heart disease and other diseases of the circulatory system: Secondary | ICD-10-CM

## 2019-07-27 DIAGNOSIS — Z8639 Personal history of other endocrine, nutritional and metabolic disease: Secondary | ICD-10-CM | POA: Insufficient documentation

## 2019-07-27 DIAGNOSIS — R079 Chest pain, unspecified: Secondary | ICD-10-CM | POA: Diagnosis not present

## 2019-07-27 NOTE — Progress Notes (Signed)
  Echocardiogram 2D Echocardiogram has been performed.  Jonathan Melendez 07/27/2019, 1:51 PM

## 2019-07-31 ENCOUNTER — Ambulatory Visit: Payer: 59 | Admitting: Cardiovascular Disease

## 2019-08-04 ENCOUNTER — Ambulatory Visit: Payer: 59 | Admitting: Cardiovascular Disease

## 2019-08-04 ENCOUNTER — Other Ambulatory Visit: Payer: Self-pay

## 2019-08-04 ENCOUNTER — Encounter: Payer: Self-pay | Admitting: Cardiovascular Disease

## 2019-08-04 VITALS — BP 132/84 | HR 70 | Ht 68.0 in | Wt 188.8 lb

## 2019-08-05 ENCOUNTER — Telehealth: Payer: Self-pay | Admitting: Cardiovascular Disease

## 2019-08-05 ENCOUNTER — Telehealth (HOSPITAL_COMMUNITY): Payer: Self-pay | Admitting: *Deleted

## 2019-08-05 LAB — BASIC METABOLIC PANEL WITH GFR
BUN/Creatinine Ratio: 8 — ABNORMAL LOW (ref 9–20)
BUN: 8 mg/dL (ref 6–24)
CO2: 23 mmol/L (ref 20–29)
Calcium: 10 mg/dL (ref 8.7–10.2)
Chloride: 99 mmol/L (ref 96–106)
Creatinine, Ser: 1.05 mg/dL (ref 0.76–1.27)
GFR calc Af Amer: 95 mL/min/1.73 (ref >59)
GFR calc non Af Amer: 82 mL/min/1.73 (ref >59)
Glucose: 270 mg/dL — ABNORMAL HIGH (ref 65–99)
Potassium: 4.9 mmol/L (ref 3.5–5.2)
Sodium: 139 mmol/L (ref 134–144)

## 2019-08-05 NOTE — Telephone Encounter (Signed)
Attempted to call patient regarding upcoming cardiac CT appointment. Left message on voicemail with name and callback number  Creedence Heiss Tai RN Navigator Cardiac Imaging [facility] [phone] Office [phone] Cell  

## 2019-08-05 NOTE — Telephone Encounter (Signed)
° °  Went to chart to check who called pt. Transferred call to [facility]

## 2019-08-06 ENCOUNTER — Telehealth: Payer: Self-pay | Admitting: Cardiovascular Disease

## 2019-08-06 NOTE — Telephone Encounter (Signed)
Left message for patient to call and schedule follow up with Dr. Gwenlyn Found for Echo and Cardiac CTA results

## 2019-08-07 ENCOUNTER — Other Ambulatory Visit: Payer: Self-pay

## 2019-08-07 ENCOUNTER — Ambulatory Visit (HOSPITAL_COMMUNITY)
Admission: RE | Admit: 2019-08-07 | Discharge: 2019-08-07 | Disposition: A | Discharge status: Home / Self Care | Payer: 59 | Source: Ambulatory Visit | Attending: Cardiovascular Disease | Admitting: Cardiovascular Disease

## 2019-08-07 DIAGNOSIS — Z8249 Family history of ischemic heart disease and other diseases of the circulatory system: Secondary | ICD-10-CM | POA: Diagnosis present

## 2019-08-07 DIAGNOSIS — R072 Precordial pain: Secondary | ICD-10-CM | POA: Insufficient documentation

## 2019-08-07 DIAGNOSIS — Z8639 Personal history of other endocrine, nutritional and metabolic disease: Secondary | ICD-10-CM | POA: Insufficient documentation

## 2019-08-07 MED ORDER — NITROGLYCERIN 0.4 MG SL SUBL
SUBLINGUAL_TABLET | SUBLINGUAL | Status: AC
  Filled 2019-08-07: qty 2

## 2019-08-07 MED ORDER — IOHEXOL 350 MG/ML SOLN
80.0000 mL | Freq: Once | INTRAVENOUS | Status: AC | PRN
  Administered 2019-08-07: 80 mL via INTRAVENOUS

## 2019-08-07 MED ORDER — NITROGLYCERIN 0.4 MG SL SUBL
0.8000 mg | SUBLINGUAL_TABLET | Freq: Once | SUBLINGUAL | Status: AC
  Administered 2019-08-07: 0.8 mg via SUBLINGUAL

## 2019-08-10 NOTE — Telephone Encounter (Signed)
Left message for patient to call and schedule follow up appointment with Dr. Gwenlyn Found for tests results

## 2019-08-17 NOTE — Telephone Encounter (Signed)
Left message for patient to call and schedule follow up with Dr. Gwenlyn Found to Cardiac CT results

## 2019-08-21 NOTE — Telephone Encounter (Signed)
Left message for patient to call and schedule follow up appt with Dr. Gwenlyn Found

## 2020-05-31 ENCOUNTER — Ambulatory Visit (HOSPITAL_COMMUNITY): Admit: 2020-05-31 | Disposition: A | Discharge status: Still In Hospital | Payer: Self-pay

## 2020-05-31 ENCOUNTER — Ambulatory Visit (INDEPENDENT_AMBULATORY_CARE_PROVIDER_SITE_OTHER)
Admission: EM | Admit: 2020-05-31 | Discharge: 2020-05-31 | Disposition: A | Discharge status: Home / Self Care | Payer: BC Managed Care – PPO | Source: Home / Self Care

## 2020-05-31 ENCOUNTER — Other Ambulatory Visit: Payer: Self-pay

## 2020-05-31 ENCOUNTER — Encounter (HOSPITAL_COMMUNITY): Payer: Self-pay | Admitting: *Deleted

## 2020-05-31 ENCOUNTER — Inpatient Hospital Stay (HOSPITAL_COMMUNITY)
Admission: EM | Admit: 2020-05-31 | Discharge: 2020-06-03 | DRG: 440 | Disposition: A | Discharge status: Home / Self Care | Payer: BC Managed Care – PPO | Attending: Family Medicine | Admitting: Family Medicine

## 2020-05-31 ENCOUNTER — Encounter (HOSPITAL_COMMUNITY): Payer: Self-pay

## 2020-05-31 DIAGNOSIS — R911 Solitary pulmonary nodule: Secondary | ICD-10-CM | POA: Diagnosis present

## 2020-05-31 DIAGNOSIS — G8929 Other chronic pain: Secondary | ICD-10-CM

## 2020-05-31 DIAGNOSIS — K859 Acute pancreatitis without necrosis or infection, unspecified: Secondary | ICD-10-CM | POA: Diagnosis not present

## 2020-05-31 DIAGNOSIS — R109 Unspecified abdominal pain: Secondary | ICD-10-CM | POA: Diagnosis present

## 2020-05-31 DIAGNOSIS — Z7984 Long term (current) use of oral hypoglycemic drugs: Secondary | ICD-10-CM

## 2020-05-31 DIAGNOSIS — Z794 Long term (current) use of insulin: Secondary | ICD-10-CM | POA: Diagnosis not present

## 2020-05-31 DIAGNOSIS — Z79899 Other long term (current) drug therapy: Secondary | ICD-10-CM

## 2020-05-31 DIAGNOSIS — R1013 Epigastric pain: Secondary | ICD-10-CM

## 2020-05-31 DIAGNOSIS — M459 Ankylosing spondylitis of unspecified sites in spine: Secondary | ICD-10-CM | POA: Diagnosis present

## 2020-05-31 DIAGNOSIS — Z801 Family history of malignant neoplasm of trachea, bronchus and lung: Secondary | ICD-10-CM

## 2020-05-31 DIAGNOSIS — E860 Dehydration: Secondary | ICD-10-CM | POA: Diagnosis present

## 2020-05-31 DIAGNOSIS — I10 Essential (primary) hypertension: Secondary | ICD-10-CM | POA: Diagnosis present

## 2020-05-31 DIAGNOSIS — E1165 Type 2 diabetes mellitus with hyperglycemia: Secondary | ICD-10-CM | POA: Insufficient documentation

## 2020-05-31 DIAGNOSIS — E119 Type 2 diabetes mellitus without complications: Secondary | ICD-10-CM

## 2020-05-31 DIAGNOSIS — E785 Hyperlipidemia, unspecified: Secondary | ICD-10-CM | POA: Diagnosis present

## 2020-05-31 DIAGNOSIS — Z20822 Contact with and (suspected) exposure to covid-19: Secondary | ICD-10-CM | POA: Diagnosis present

## 2020-05-31 HISTORY — DX: Ankylosing spondylitis of unspecified sites in spine: M45.9

## 2020-05-31 HISTORY — DX: Hyperlipidemia, unspecified: E78.5

## 2020-05-31 LAB — CBC
HCT: 50.8 % (ref 39.0–52.0)
Hemoglobin: 17.3 g/dL — ABNORMAL HIGH (ref 13.0–17.0)
MCH: 30.1 pg (ref 26.0–34.0)
MCHC: 34.1 g/dL (ref 30.0–36.0)
MCV: 88.5 fL (ref 80.0–100.0)
Platelets: 251 K/uL (ref 150–400)
RBC: 5.74 MIL/uL (ref 4.22–5.81)
RDW: 12.4 % (ref 11.5–15.5)
WBC: 12.3 K/uL — ABNORMAL HIGH (ref 4.0–10.5)
nRBC: 0 % (ref 0.0–0.2)

## 2020-05-31 LAB — CBG MONITORING, ED: Glucose-Capillary: 136 mg/dL — ABNORMAL HIGH (ref 70–99)

## 2020-05-31 LAB — LIPASE, BLOOD: Lipase: 301 U/L — ABNORMAL HIGH (ref 11–51)

## 2020-05-31 MED ORDER — HYDROCODONE-ACETAMINOPHEN 5-325 MG PO TABS: 1.0000 | ORAL_TABLET | Freq: Once | ORAL | Status: DC

## 2020-05-31 MED ORDER — HYDROCODONE-ACETAMINOPHEN 5-325 MG PO TABS
ORAL_TABLET | ORAL | Status: AC
  Filled 2020-05-31: qty 1

## 2020-05-31 NOTE — ED Triage Notes (Signed)
Pt reports he has had ABD pain and back Pain since DEC.2021. Pt  Wants pain relife. He also thinks he is dehydrate.

## 2020-05-31 NOTE — ED Provider Notes (Signed)
[place]   MRN: [id number] DOB: 1970-05-03  Subjective:   Jonathan Melendez is a 50 y.o. male presenting for 58-month history of persistent mid abdominal pain that radiates into his mid back.  Patient has both a PCP and gastroenterologist.  Recently had a CT scan March 23 of this year.  He was found to have scattered diverticulosis, hepatic steatosis.  He also had an elevated lipase level from the same day.  However, he was not found to have pancreatitis.  Patient does have uncontrolled diabetes type 2 and is on insulin.  States that he has been trying to practice a diabetic friendly diet and is limiting his sweets.  Has been advised by his regular care doctor and gastroenterologist to use Tylenol for his pain control.  They are discussing the possibility of ulcerative colitis but work-up is pending.  No current facility-administered medications for this encounter.  Current Outpatient Medications:  .  atorvastatin (LIPITOR) 20 MG tablet, Take 20 mg by mouth daily., Disp: , Rfl:  .  glimepiride (AMARYL) 2 MG tablet, Take 2 mg by mouth 2 (two) times daily., Disp: , Rfl:  .  insulin degludec (TRESIBA FLEXTOUCH) 100 UNIT/ML FlexTouch Pen, Inject 8-10 mLs into the skin daily. , Disp: , Rfl:  .  metFORMIN (GLUCOPHAGE-XR) 500 MG 24 hr tablet, Take 500 mg by mouth in the morning and at bedtime. , Disp: , Rfl:  .  metoprolol succinate (TOPROL-XL) 50 MG 24 hr tablet, Take 50 mg by mouth daily., Disp: , Rfl:  .  nitroGLYCERIN (NITROSTAT) 0.4 MG SL tablet, Place 1 tablet (0.4 mg total) under the tongue every 5 (five) minutes as needed for chest pain. Max 3 doses in 15 minutes, Disp: 25 tablet, Rfl: 3   No Known Allergies  Past Medical History:  Diagnosis Date  . Diabetes mellitus without complication ([place])   . Hypertension      No past surgical history on file.  No family history on file.  Social History   Tobacco Use  . Smoking status: Never Smoker  . Smokeless tobacco:  Never Used    ROS   Objective:   Vitals: BP 140/87 (BP Location: Right Arm)   Pulse 61   Temp 98.1 F (36.7 C) (Oral)   Resp 18   SpO2 97%   Physical Exam Constitutional:      General: He is not in acute distress.    Appearance: Normal appearance. He is well-developed and normal weight. He is not ill-appearing, toxic-appearing or diaphoretic.  HENT:     Head: Normocephalic and atraumatic.     Right Ear: External ear normal.     Left Ear: External ear normal.     Nose: Nose normal.     Mouth/Throat:     Mouth: Mucous membranes are moist.     Pharynx: Oropharynx is clear.  Eyes:     General: No scleral icterus.       Right eye: No discharge.        Left eye: No discharge.     Extraocular Movements: Extraocular movements intact.     Pupils: Pupils are equal, round, and reactive to light.  Cardiovascular:     Rate and Rhythm: Normal rate and regular rhythm.     Heart sounds: Normal heart sounds. No murmur heard. No friction rub. No gallop.   Pulmonary:     Effort: Pulmonary effort is normal. No respiratory distress.     Breath sounds: Normal breath sounds.  No stridor. No wheezing, rhonchi or rales.  Abdominal:     General: Bowel sounds are normal. There is no distension.     Palpations: Abdomen is soft. There is no mass.     Tenderness: There is abdominal tenderness in the epigastric area. There is no guarding or rebound.  Musculoskeletal:     Cervical back: Normal range of motion.  Skin:    General: Skin is warm and dry.  Neurological:     Mental Status: He is alert and oriented to person, place, and time.  Psychiatric:        Mood and Affect: Mood normal.        Behavior: Behavior normal.        Thought Content: Thought content normal.        Judgment: Judgment normal.     Results for orders placed or performed during the hospital encounter of 05/31/20 (from the past 24 hour(s))  POC CBG monitoring     Status: Abnormal   Collection Time: 05/31/20  2:13 PM   Result Value Ref Range   Glucose-Capillary 136 (H) 70 - 99 mg/dL    Hydrocodone given to patient in clinic.   Assessment and Plan :   PDMP not reviewed this encounter.  1. Abdominal pain, chronic, epigastric   2. Uncontrolled type 2 diabetes mellitus with hyperglycemia ([place])     Patient has acute on chronic epigastric abdominal pain with possibility of pancreatitis.  We discussed this at length and will pursue a lipase level in clinic, redirect him if it is higher than the last level.  Otherwise recommended he discuss chronic pain management with his regular doctor or gastroenterologist. Counseled patient on potential for adverse effects with medications prescribed/recommended today, ER and return-to-clinic precautions discussed, patient verbalized understanding.    Jaynee Eagles, PA-C 05/31/20 1420

## 2020-05-31 NOTE — ED Triage Notes (Signed)
Pt c/o abdominal pain that started in Dec. Pt went to UC today and was told to come to ED because his lipase was over 300. Pt has had nausea and diarrhea.

## 2020-05-31 NOTE — Discharge Instructions (Signed)
I will call you with your results for your lipase level.  As a reminder if it is severely elevated or dramatically higher than the last level I will redirected to the emergency room for an urgent CT scan or MRI for recheck of possible pancreatitis.  In the meantime, please make sure that you check back with your regular doctor about managing your chronic pain outside of using the medication like Tylenol.  Please continue to stay away from the class of NSAIDs (nonsteroidal anti-inflammatories) which includes over-the-counter medications like ibuprofen, Motrin, Advil, Aleve, naproxen.

## 2020-05-31 NOTE — ED Notes (Signed)
CBG 136 

## 2020-06-01 ENCOUNTER — Inpatient Hospital Stay (HOSPITAL_COMMUNITY): Payer: BC Managed Care – PPO

## 2020-06-01 ENCOUNTER — Encounter (HOSPITAL_COMMUNITY): Payer: Self-pay | Admitting: Internal Medicine

## 2020-06-01 ENCOUNTER — Emergency Department (HOSPITAL_COMMUNITY): Payer: BC Managed Care – PPO

## 2020-06-01 DIAGNOSIS — Z801 Family history of malignant neoplasm of trachea, bronchus and lung: Secondary | ICD-10-CM | POA: Diagnosis not present

## 2020-06-01 DIAGNOSIS — M455 Ankylosing spondylitis of thoracolumbar region: Secondary | ICD-10-CM | POA: Diagnosis not present

## 2020-06-01 DIAGNOSIS — E119 Type 2 diabetes mellitus without complications: Secondary | ICD-10-CM

## 2020-06-01 DIAGNOSIS — E1165 Type 2 diabetes mellitus with hyperglycemia: Secondary | ICD-10-CM | POA: Diagnosis present

## 2020-06-01 DIAGNOSIS — M459 Ankylosing spondylitis of unspecified sites in spine: Secondary | ICD-10-CM | POA: Diagnosis present

## 2020-06-01 DIAGNOSIS — Z7984 Long term (current) use of oral hypoglycemic drugs: Secondary | ICD-10-CM | POA: Diagnosis not present

## 2020-06-01 DIAGNOSIS — R1013 Epigastric pain: Secondary | ICD-10-CM | POA: Diagnosis present

## 2020-06-01 DIAGNOSIS — R1033 Periumbilical pain: Secondary | ICD-10-CM | POA: Diagnosis not present

## 2020-06-01 DIAGNOSIS — Z79899 Other long term (current) drug therapy: Secondary | ICD-10-CM | POA: Diagnosis not present

## 2020-06-01 DIAGNOSIS — R911 Solitary pulmonary nodule: Secondary | ICD-10-CM | POA: Diagnosis present

## 2020-06-01 DIAGNOSIS — Z20822 Contact with and (suspected) exposure to covid-19: Secondary | ICD-10-CM | POA: Diagnosis present

## 2020-06-01 DIAGNOSIS — E785 Hyperlipidemia, unspecified: Secondary | ICD-10-CM | POA: Diagnosis present

## 2020-06-01 DIAGNOSIS — R1011 Right upper quadrant pain: Secondary | ICD-10-CM | POA: Diagnosis not present

## 2020-06-01 DIAGNOSIS — E860 Dehydration: Secondary | ICD-10-CM | POA: Diagnosis present

## 2020-06-01 DIAGNOSIS — R109 Unspecified abdominal pain: Secondary | ICD-10-CM | POA: Diagnosis present

## 2020-06-01 DIAGNOSIS — I10 Essential (primary) hypertension: Secondary | ICD-10-CM | POA: Diagnosis present

## 2020-06-01 DIAGNOSIS — K8500 Idiopathic acute pancreatitis without necrosis or infection: Secondary | ICD-10-CM | POA: Diagnosis not present

## 2020-06-01 DIAGNOSIS — K859 Acute pancreatitis without necrosis or infection, unspecified: Secondary | ICD-10-CM | POA: Diagnosis present

## 2020-06-01 LAB — URINALYSIS, ROUTINE W REFLEX MICROSCOPIC
Bilirubin Urine: NEGATIVE
Glucose, UA: NEGATIVE mg/dL
Hgb urine dipstick: NEGATIVE
Ketones, ur: 20 mg/dL — AB
Leukocytes,Ua: NEGATIVE
Nitrite: NEGATIVE
Protein, ur: NEGATIVE mg/dL
Specific Gravity, Urine: 1.019 (ref 1.005–1.030)
pH: 5 (ref 5.0–8.0)

## 2020-06-01 LAB — HIV ANTIBODY (ROUTINE TESTING W REFLEX): HIV Screen 4th Generation wRfx: NONREACTIVE

## 2020-06-01 LAB — COMPREHENSIVE METABOLIC PANEL WITH GFR
ALT: 43 U/L (ref 0–44)
AST: 20 U/L (ref 15–41)
Albumin: 4.2 g/dL (ref 3.5–5.0)
Alkaline Phosphatase: 65 U/L (ref 38–126)
Anion gap: 5 (ref 5–15)
BUN: 7 mg/dL (ref 6–20)
CO2: 30 mmol/L (ref 22–32)
Calcium: 9.9 mg/dL (ref 8.9–10.3)
Chloride: 100 mmol/L (ref 98–111)
Creatinine, Ser: 0.95 mg/dL (ref 0.61–1.24)
GFR, Estimated: >60 mL/min (ref >60)
Glucose, Bld: 162 mg/dL — ABNORMAL HIGH (ref 70–99)
Potassium: 4 mmol/L (ref 3.5–5.1)
Sodium: 135 mmol/L (ref 135–145)
Total Bilirubin: 1.2 mg/dL (ref 0.3–1.2)
Total Protein: 6.9 g/dL (ref 6.5–8.1)

## 2020-06-01 LAB — CBG MONITORING, ED: Glucose-Capillary: 142 mg/dL — ABNORMAL HIGH (ref 70–99)

## 2020-06-01 LAB — GLUCOSE, CAPILLARY
Glucose-Capillary: 151 mg/dL — ABNORMAL HIGH (ref 70–99)
Glucose-Capillary: 212 mg/dL — ABNORMAL HIGH (ref 70–99)

## 2020-06-01 LAB — LIPASE, BLOOD: Lipase: 82 U/L — ABNORMAL HIGH (ref 11–51)

## 2020-06-01 LAB — SARS CORONAVIRUS 2 (TAT 6-24 HRS): SARS Coronavirus 2: NEGATIVE

## 2020-06-01 LAB — HEMOGLOBIN A1C
Hgb A1c MFr Bld: 9.1 % — ABNORMAL HIGH (ref 4.8–5.6)
Mean Plasma Glucose: 214.47 mg/dL

## 2020-06-01 MED ORDER — INSULIN GLARGINE 100 UNIT/ML ~~LOC~~ SOLN: 8.0000 [IU] | Freq: Every day | SUBCUTANEOUS | Status: DC

## 2020-06-01 MED ORDER — MORPHINE SULFATE (PF) 2 MG/ML IV SOLN: 2.0000 mg | INTRAVENOUS | Status: DC | PRN

## 2020-06-01 MED ORDER — MORPHINE SULFATE (PF) 4 MG/ML IV SOLN
4.0000 mg | Freq: Once | INTRAVENOUS | Status: AC
  Administered 2020-06-01: 4 mg via INTRAVENOUS
  Filled 2020-06-01: qty 1

## 2020-06-01 MED ORDER — ACETAMINOPHEN 325 MG PO TABS
650.0000 mg | ORAL_TABLET | Freq: Four times a day (QID) | ORAL | Status: DC | PRN
  Administered 2020-06-01 – 2020-06-02 (×3): 650 mg via ORAL
  Filled 2020-06-01 (×3): qty 2

## 2020-06-01 MED ORDER — HYDRALAZINE HCL 20 MG/ML IJ SOLN: 5.0000 mg | INTRAMUSCULAR | Status: DC | PRN

## 2020-06-01 MED ORDER — LACTATED RINGERS IV SOLN: INTRAVENOUS | Status: DC

## 2020-06-01 MED ORDER — HYDROCODONE-ACETAMINOPHEN 5-325 MG PO TABS
1.0000 | ORAL_TABLET | Freq: Three times a day (TID) | ORAL | Status: DC | PRN
  Administered 2020-06-01 – 2020-06-02 (×2): 1 via ORAL
  Filled 2020-06-01 (×2): qty 1

## 2020-06-01 MED ORDER — ATORVASTATIN CALCIUM 10 MG PO TABS
20.0000 mg | ORAL_TABLET | Freq: Every day | ORAL | Status: DC
  Administered 2020-06-01 – 2020-06-03 (×3): 20 mg via ORAL
  Filled 2020-06-01 (×3): qty 2

## 2020-06-01 MED ORDER — ACETAMINOPHEN 650 MG RE SUPP: 650.0000 mg | Freq: Four times a day (QID) | RECTAL | Status: DC | PRN

## 2020-06-01 MED ORDER — SODIUM CHLORIDE 0.9 % IV BOLUS
1000.0000 mL | Freq: Once | INTRAVENOUS | Status: AC
  Administered 2020-06-01: 1000 mL via INTRAVENOUS

## 2020-06-01 MED ORDER — METOPROLOL SUCCINATE ER 50 MG PO TB24
50.0000 mg | ORAL_TABLET | Freq: Every day | ORAL | Status: DC
  Administered 2020-06-01 – 2020-06-03 (×3): 50 mg via ORAL
  Filled 2020-06-01 (×2): qty 1
  Filled 2020-06-01: qty 2

## 2020-06-01 MED ORDER — ONDANSETRON HCL 4 MG/2ML IJ SOLN: 4.0000 mg | Freq: Four times a day (QID) | INTRAMUSCULAR | Status: DC | PRN

## 2020-06-01 MED ORDER — INSULIN ASPART 100 UNIT/ML ~~LOC~~ SOLN
0.0000 [IU] | Freq: Every day | SUBCUTANEOUS | Status: DC
  Administered 2020-06-01: 2 [IU] via SUBCUTANEOUS

## 2020-06-01 MED ORDER — IOHEXOL 300 MG/ML  SOLN
100.0000 mL | Freq: Once | INTRAMUSCULAR | Status: AC | PRN
  Administered 2020-06-01: 100 mL via INTRAVENOUS

## 2020-06-01 MED ORDER — ONDANSETRON HCL 4 MG PO TABS
4.0000 mg | ORAL_TABLET | Freq: Four times a day (QID) | ORAL | Status: DC | PRN
  Administered 2020-06-01: 4 mg via ORAL
  Filled 2020-06-01: qty 1

## 2020-06-01 MED ORDER — INSULIN ASPART 100 UNIT/ML ~~LOC~~ SOLN
0.0000 [IU] | Freq: Three times a day (TID) | SUBCUTANEOUS | Status: DC
  Administered 2020-06-01: 2 [IU] via SUBCUTANEOUS
  Administered 2020-06-02: 3 [IU] via SUBCUTANEOUS
  Administered 2020-06-02: 5 [IU] via SUBCUTANEOUS
  Administered 2020-06-03: 3 [IU] via SUBCUTANEOUS

## 2020-06-01 NOTE — H&P (Signed)
History and Physical    Jonathan Melendez XBL:[id number] DOB: 14-Aug-1970 DOA: 05/31/2020  PCP: Irene Pap Consultants:  Benson Norway - GI; Gwenlyn Found - cardiology Patient coming from:  Home - lives with girlfriend and her daughter and brother and his mother; NOK: Mother, Jonathan Melendez, [phone]  Chief Complaint: Abdominal pain  HPI: Jonathan Melendez is a 50 y.o. male with medical history significant of HTN; ankylosing spondylitis; and DM presenting with abdominal pain.  He has been having RUQ pain with radiation to the back. Some diarrhea.  Symptoms since before Christmas but worse in the last 24 hours.  He has seen Dr. Benson Norway - initially ?pancreatitis but more recently thinking it could be UC.  Diarrhea has been bloody x 3 total - once yesterday.  Diarrhea happens almost daily, usually about 1.5 hours after eating and all 3 meals.  No emesis, but he does have periodic nausea.  No fevers.  He is scheduled for above and below scopes on 4/28.  +weight loss, but he also changed his diet.    ED Course:  Prolonged abdominal pain, sees Dr. Benson Norway.  ?liver lesions, lipase slightly increased.  Dr. Benson Norway to see.  Review of Systems: As per HPI; otherwise review of systems reviewed and negative.   Ambulatory Status:  Ambulates without assistance  COVID Vaccine Status:  Complete plus booster  Past Medical History:  Diagnosis Date  . Ankylosing spondylitis ([place])   . Diabetes mellitus without complication ([place])   . Dyslipidemia   . Hypertension     Past Surgical History:  Procedure Laterality Date  . TONSILLECTOMY    . VASECTOMY    . VASECTOMY REVERSAL      Social History   Socioeconomic History  . Marital status: Married    Spouse name: Not on file  . Number of children: Not on file  . Years of education: Not on file  . Highest education level: Not on file  Occupational History  . Occupation: Financial trader  Tobacco Use  . Smoking status: Never Smoker  . Smokeless  tobacco: Never Used  Substance and Sexual Activity  . Alcohol use: Yes    Comment: occasional  . Drug use: Never  . Sexual activity: Not on file  Other Topics Concern  . Not on file  Social History Narrative  . Not on file   Social Determinants of Health   Financial Resource Strain: Not on file  Food Insecurity: Not on file  Transportation Needs: Not on file  Physical Activity: Not on file  Stress: Not on file  Social Connections: Not on file  Intimate Partner Violence: Not on file    No Known Allergies  Family History  Problem Relation Age of Onset  . Lung cancer Father   . Dementia Father   . Autoimmune disease Brother        x2  . Lung cancer Paternal Grandfather   . Dementia Paternal Grandfather     Prior to Admission medications   Medication Sig Start Date End Date Taking? Authorizing Provider  atorvastatin (LIPITOR) 20 MG tablet Take 20 mg by mouth daily.    [provider]  glimepiride (AMARYL) 2 MG tablet Take 2 mg by mouth 2 (two) times daily. 05/30/19   [provider]  insulin degludec (TRESIBA FLEXTOUCH) 100 UNIT/ML FlexTouch Pen Inject 8-10 mLs into the skin daily.     [provider]  metFORMIN (GLUCOPHAGE-XR) 500 MG 24 hr tablet Take 500 mg by mouth in the morning and at  bedtime.     [provider]  metoprolol succinate (TOPROL-XL) 50 MG 24 hr tablet Take 50 mg by mouth daily.    [provider]  nitroGLYCERIN (NITROSTAT) 0.4 MG SL tablet Place 1 tablet (0.4 mg total) under the tongue every 5 (five) minutes as needed for chest pain. Max 3 doses in 15 minutes 07/24/19 10/22/19  Lorretta Harp, MD    Physical Exam: Vitals:   06/01/20 0745 06/01/20 0830 06/01/20 0930 06/01/20 1000  BP: (!) 159/94 (!) 153/95 (!) 148/92 137/87  Pulse: 69 (!) 58 (!) 52 (!) 57  Resp: 20 16 19 17   Temp:      TempSrc:      SpO2: 99% 96% 98% 94%     . General:  Appears calm and comfortable and is in NAD . Eyes:  PERRL, EOMI,  normal lids, iris . ENT:  grossly normal hearing, lips & tongue, mmm; appropriate dentition . Neck:  no LAD, masses or thyromegaly . Cardiovascular:  RRR, no m/r/g. No LE edema.  Marland Kitchen Respiratory:   CTA bilaterally with no wheezes/rales/rhonchi.  Normal respiratory effort. . Abdomen:  soft, NT, ND . Back:   normal alignment, no CVAT, mild TTP along R lumbar paraspinous muscles . Skin:  no rash or induration seen on limited exam . Musculoskeletal:  grossly normal tone BUE/BLE, good ROM, no bony abnormality . Psychiatric:  grossly normal mood and affect, speech fluent and appropriate, AOx3 . Neurologic:  CN 2-12 grossly intact, moves all extremities in coordinated fashion    Radiological Exams on Admission: Independently reviewed - see discussion in A/P where applicable  CT Abdomen Pelvis W Contrast  Result Date: 06/01/2020 CLINICAL DATA:  Liver lesion on ultrasound. Epigastric and mid abdominal pain for 2 months. Diarrhea. EXAM: CT ABDOMEN AND PELVIS WITH CONTRAST TECHNIQUE: Multidetector CT imaging of the abdomen and pelvis was performed using the standard protocol following bolus administration of intravenous contrast. CONTRAST:  163mL OMNIPAQUE IOHEXOL 300 MG/ML  SOLN COMPARISON:  Current abdominal ultrasound.  Prior CT, 09/05/2016. FINDINGS: Lower chest: No acute findings. 4 mm nodule, right lower lobe, image 14, series 5. Hepatobiliary: No liver mass or focal lesion. The hypoechoic areas noted on the current ultrasound are most likely due to focal fatty sparing, not resolved on CT. Liver is normal in size. There is decreased overall liver attenuation consistent with hepatic steatosis. Normal gallbladder. No bile duct dilation. Pancreas: Unremarkable. No pancreatic ductal dilatation or surrounding inflammatory changes. Spleen: Normal in size without focal abnormality. Adrenals/Urinary Tract: Adrenal glands are unremarkable. Kidneys are normal, without renal calculi, focal lesion, or  hydronephrosis. Bladder is unremarkable. Stomach/Bowel: Stomach is within normal limits. Appendix appears normal. No evidence of bowel wall thickening, distention, or inflammatory changes. Vascular/Lymphatic: No significant vascular findings are present. No enlarged abdominal or pelvic lymph nodes. Reproductive: Unremarkable. Other: No abdominal wall hernia or abnormality. No abdominopelvic ascites. Musculoskeletal: No fracture or acute finding. No bone lesion. Disc degenerative changes at L4-L5 and L5-S1. IMPRESSION: 1. No acute findings.  No evidence of abscess/infection. 2. No liver mass or focal lesion. The hypoechoic lesions noted on the current right upper quadrant ultrasound are most likely due to areas of focal fat that are not resolved on CT. 3. Hepatic steatosis.  Lower lumbar spine disc degenerative changes. 4. 4 mm nodule, right lower lobe. Given the apparent development of this nodule since the prior CT, follow-up chest CT in 6-12 months is recommended. Electronically Signed   By: Dedra Skeens.D.  On: 06/01/2020 10:56   US Abdomen Limited  Result Date: 06/01/2020 CLINICAL DATA:  50 year old male with 6 months of right upper quadrant pain. EXAM: ULTRASOUND ABDOMEN LIMITED RIGHT UPPER QUADRANT COMPARISON:  CT Abdomen and Pelvis 09/05/2016. FINDINGS: Gallbladder: No gallstones or wall thickening visualized. No sonographic Murphy sign noted by sonographer. Common bile duct: Diameter: 4 mm, normal. Liver: Echogenic liver (images 20, 34). No hepatomegaly is evident. No intrahepatic biliary ductal enlargement. Portal vein is patent on color Doppler imaging with normal direction of blood flow towards the liver. But is a hypoechoic liver lesion measuring about 9 mm in the right lobe (image 38). And a more indistinct area of hypoechogenicity adjacent to the gallbladder fossa (series 7, image 109). And possibly a 2nd 2 cm lesion at the caudate (image 17, 44) which are not correlated on the 2018 CT. Other:  Negative visible right kidney. IMPRESSION: 1. Evidence of hepatic steatosis with several small indeterminate liver lesions (2 cm or smaller) which are not correlated on a 2018 CT. Given no other clear explanation for right upper quadrant pain recommend a follow-up Abdomen MRI (preferred) or CT (liver protocol, without and with contrast) to further characterize. 2. Negative gallbladder.  No evidence of bile duct obstruction. Electronically Signed   By: Genevie Ann M.D.   On: 06/01/2020 08:43    EKG: Independently reviewed.  NSR with rate 58; no evidence of acute ischemia   Labs on Admission: I have personally reviewed the available labs and imaging studies at the time of the admission.  Pertinent labs:   Glucose 162 Lipase 82 WBC 12.3 UA: 20 ketones   Assessment/Plan Principal Problem:   Abdominal pain Active Problems:   Essential hypertension   Dyslipidemia   Diabetes mellitus without complication (HCC)   Ankylosing spondylitis (H[facility])   Abdominal pain, diarrhea -Patient with several month history of R-sided periumbilical abdominal pain with daily diarrhea and occasional hematochezia -He was scheduled for endoscopy and colonoscopy of 4/28 -Symptoms worsened in the last few days -Initial labs are reassuring other than mild dehydration as evidenced by ketonuria -RUQ [place] with some questionable liver lesions, but CT is unremarkable -Suspect that he needs scopes done tomorrow in order to further investigate -He does have h/o ankylosing spondylitis and so autoimmune (?UC) is a higher consideration -Consult by Dr. Benson Norway is pending -Will admit to med Surg for IVF and further evaluation  Lung nodule -4 mm incidental nodule appreciated on CT -Will need f/u in 6-12 months -He is a nonsmoker  HTN -Continue Toprol XL  HLD -Continue Lipitor (given no LFT derangement)  DM -Will check A1c -hold Glucophage, Amaryl -He was previously on [place] but is not currently on insulin -Cover with  moderate-scale SSI  Ankylosing spondylitis -It is possible that his abdominal pain is actually referred pain from his back -As noted above, additional rheumatologic considerations are important -I have reviewed this patient in the  Controlled Substances Reporting System.  He is not receiving controlled substance medications and so it is unusual that this is listed on his Med Rec. -will continue/start Norco and morphine prn pain for now.    Note: This patient has been tested and is pending for the novel coronavirus COVID-19. The patient has been fully vaccinated against COVID-19.   Level of care: Med-Surg DVT prophylaxis: SCDs (given reported hematochezia) Code Status:  Full - confirmed with patient Family Communication: None present Disposition Plan:  The patient is from: home  Anticipated d/c is to: home without [facility] services  Anticipated d/c date will depend on clinical response to treatment, likely 2-3 days  Patient is currently: acutely ill Consults called: GI  Admission status:  Admit - It is my clinical opinion that admission to INPATIENT is reasonable and necessary because of the expectation that this patient will require hospital care that crosses at least 2 midnights to treat this condition based on the medical complexity of the problems presented.  Given the aforementioned information, the predictability of an adverse outcome is felt to be significant.    Karmen Bongo MD [facility]   How to contact the [facility] Attending or Consulting provider [place] or covering provider during after hours [place], for this patient?  1. Check the care team in [facility] and look for a) attending/consulting [facility] provider listed and b) the [facility] team listed 2. Log into [email or web address] and use [facility]'s universal password to access. If you do not have the password, please contact the hospital operator. 3. Locate the [facility] provider you are looking for under [facility] and page to a number  that you can be directly reached. 4. If you still have difficulty reaching the provider, please page the [facility] (Director on Call) for the Hospitalists listed on amion for assistance.   06/01/2020, 11:25 AM

## 2020-06-01 NOTE — Consult Note (Signed)
Reason for Consult: Acute pancreatitis Referring Physician: [facility]  Claudie Leach HPI: This is a 50 year old male with PMH of pancreatitis, DM, hyperlipidemia, and HTN admitted for acute pancreatitis.  The patient was initially evaluated in the office this past Monday for complaints of abdominal pain.  The pain was described as his prior pancreatitis pain, but it was much more intense.  The pain was also lower in the right side of the abdomen.  In the office he also reported having bouts orf diarrhea on a daily basis.  The diarrhea was associated with one melenic stool, but that was at the outset of his diarrhea.  He has a history of Ankylosing Spondylitis, but no known family history of UC.  Two years ago he had a colonoscopy for screening purposes and it was a normal examination.  Since the onset of his symptoms he reports having weight loss, but this was as a result of changing his diet.  The change was to a more plant-based diet and he felt that he was well until recently.  Even with his weight loss he is not able to have better control of his blood sugar.  In fact, his blood sugars typically average in the 200 range.  It is not uncommon to see blood sugars in the 300 range.  In 2018 he was diagnosed with pancreatitis, but his scan at that time did not show pancreatitis.  It did show a sigmoid colitis.  His current admission lipase was at 300 and he is feeling better with pain control.  Past Medical History:  Diagnosis Date  . Ankylosing spondylitis ([place])   . Diabetes mellitus without complication ([place])   . Dyslipidemia   . Hypertension     Past Surgical History:  Procedure Laterality Date  . TONSILLECTOMY    . VASECTOMY    . VASECTOMY REVERSAL      Family History  Problem Relation Age of Onset  . Lung cancer Father   . Dementia Father   . Autoimmune disease Brother        x2  . Lung cancer Paternal Grandfather   . Dementia Paternal Grandfather     Social History:   reports that he has never smoked. He has never used smokeless tobacco. He reports current alcohol use. He reports that he does not use drugs.  Allergies: No Known Allergies  Medications:  Scheduled: . atorvastatin  20 mg Oral Daily  . insulin aspart  0-15 Units Subcutaneous TID WC  . insulin aspart  0-5 Units Subcutaneous QHS  . metoprolol succinate  50 mg Oral Daily   Continuous: . lactated ringers 100 mL/hr at 06/01/20 1138    Results for orders placed or performed during the hospital encounter of 05/31/20 (from the past 24 hour(s))  Urinalysis, Routine w reflex microscopic     Status: Abnormal   Collection Time: 05/31/20 11:07 PM  Result Value Ref Range   Color, Urine YELLOW YELLOW   APPearance HAZY (A) CLEAR   Specific Gravity, Urine 1.019 1.005 - 1.030   pH 5.0 5.0 - 8.0   Glucose, UA NEGATIVE NEGATIVE mg/dL   Hgb urine dipstick NEGATIVE NEGATIVE   Bilirubin Urine NEGATIVE NEGATIVE   Ketones, ur 20 (A) NEGATIVE mg/dL   Protein, ur NEGATIVE NEGATIVE mg/dL   Nitrite NEGATIVE NEGATIVE   Leukocytes,Ua NEGATIVE NEGATIVE  Lipase, blood     Status: Abnormal   Collection Time: 05/31/20 11:15 PM  Result Value Ref Range   Lipase 82 (H)  11 - 51 U/L  Comprehensive metabolic panel     Status: Abnormal   Collection Time: 05/31/20 11:15 PM  Result Value Ref Range   Sodium 135 135 - 145 mmol/L   Potassium 4.0 3.5 - 5.1 mmol/L   Chloride 100 98 - 111 mmol/L   CO2 30 22 - 32 mmol/L   Glucose, Bld 162 (H) 70 - 99 mg/dL   BUN 7 6 - 20 mg/dL   Creatinine, Ser 0.95 0.61 - 1.24 mg/dL   Calcium 9.9 8.9 - 10.3 mg/dL   Total Protein 6.9 6.5 - 8.1 g/dL   Albumin 4.2 3.5 - 5.0 g/dL   AST 20 15 - 41 U/L   ALT 43 0 - 44 U/L   Alkaline Phosphatase 65 38 - 126 U/L   Total Bilirubin 1.2 0.3 - 1.2 mg/dL   GFR, Estimated >60 >60 mL/min   Anion gap 5 5 - 15  CBC     Status: Abnormal   Collection Time: 05/31/20 11:15 PM  Result Value Ref Range   WBC 12.3 (H) 4.0 - 10.5 K/uL   RBC 5.74 4.22 -  5.81 MIL/uL   Hemoglobin 17.3 (H) 13.0 - 17.0 g/dL   HCT 50.8 39.0 - 52.0 %   MCV 88.5 80.0 - 100.0 fL   MCH 30.1 26.0 - 34.0 pg   MCHC 34.1 30.0 - 36.0 g/dL   RDW 12.4 11.5 - 15.5 %   Platelets 251 150 - 400 K/uL   nRBC 0.0 0.0 - 0.2 %  HIV Antibody (routine testing w rflx)     Status: None   Collection Time: 06/01/20 11:12 AM  Result Value Ref Range   HIV Screen 4th Generation wRfx Non Reactive Non Reactive  SARS CORONAVIRUS 2 (TAT 6-24 HRS) Nasopharyngeal Nasopharyngeal Swab     Status: None   Collection Time: 06/01/20 11:35 AM   Specimen: Nasopharyngeal Swab  Result Value Ref Range   SARS Coronavirus 2 NEGATIVE NEGATIVE  CBG monitoring, ED     Status: Abnormal   Collection Time: 06/01/20 12:48 PM  Result Value Ref Range   Glucose-Capillary 142 (H) 70 - 99 mg/dL  Hemoglobin A1c     Status: Abnormal   Collection Time: 06/01/20  4:56 PM  Result Value Ref Range   Hgb A1c MFr Bld 9.1 (H) 4.8 - 5.6 %   Mean Plasma Glucose 214.47 mg/dL  Glucose, capillary     Status: Abnormal   Collection Time: 06/01/20  5:13 PM  Result Value Ref Range   Glucose-Capillary 151 (H) 70 - 99 mg/dL  Glucose, capillary     Status: Abnormal   Collection Time: 06/01/20  9:28 PM  Result Value Ref Range   Glucose-Capillary 212 (H) 70 - 99 mg/dL     CT Abdomen Pelvis W Contrast  Result Date: 06/01/2020 CLINICAL DATA:  Liver lesion on ultrasound. Epigastric and mid abdominal pain for 2 months. Diarrhea. EXAM: CT ABDOMEN AND PELVIS WITH CONTRAST TECHNIQUE: Multidetector CT imaging of the abdomen and pelvis was performed using the standard protocol following bolus administration of intravenous contrast. CONTRAST:  193mL OMNIPAQUE IOHEXOL 300 MG/ML  SOLN COMPARISON:  Current abdominal ultrasound.  Prior CT, 09/05/2016. FINDINGS: Lower chest: No acute findings. 4 mm nodule, right lower lobe, image 14, series 5. Hepatobiliary: No liver mass or focal lesion. The hypoechoic areas noted on the current ultrasound are  most likely due to focal fatty sparing, not resolved on CT. Liver is normal in size. There is decreased overall  liver attenuation consistent with hepatic steatosis. Normal gallbladder. No bile duct dilation. Pancreas: Unremarkable. No pancreatic ductal dilatation or surrounding inflammatory changes. Spleen: Normal in size without focal abnormality. Adrenals/Urinary Tract: Adrenal glands are unremarkable. Kidneys are normal, without renal calculi, focal lesion, or hydronephrosis. Bladder is unremarkable. Stomach/Bowel: Stomach is within normal limits. Appendix appears normal. No evidence of bowel wall thickening, distention, or inflammatory changes. Vascular/Lymphatic: No significant vascular findings are present. No enlarged abdominal or pelvic lymph nodes. Reproductive: Unremarkable. Other: No abdominal wall hernia or abnormality. No abdominopelvic ascites. Musculoskeletal: No fracture or acute finding. No bone lesion. Disc degenerative changes at L4-L5 and L5-S1. IMPRESSION: 1. No acute findings.  No evidence of abscess/infection. 2. No liver mass or focal lesion. The hypoechoic lesions noted on the current right upper quadrant ultrasound are most likely due to areas of focal fat that are not resolved on CT. 3. Hepatic steatosis.  Lower lumbar spine disc degenerative changes. 4. 4 mm nodule, right lower lobe. Given the apparent development of this nodule since the prior CT, follow-up chest CT in 6-12 months is recommended. Electronically Signed   By: Lajean Manes M.D.   On: 06/01/2020 10:56   US Abdomen Limited  Result Date: 06/01/2020 CLINICAL DATA:  50 year old male with 6 months of right upper quadrant pain. EXAM: ULTRASOUND ABDOMEN LIMITED RIGHT UPPER QUADRANT COMPARISON:  CT Abdomen and Pelvis 09/05/2016. FINDINGS: Gallbladder: No gallstones or wall thickening visualized. No sonographic Murphy sign noted by sonographer. Common bile duct: Diameter: 4 mm, normal. Liver: Echogenic liver (images 20, 34). No  hepatomegaly is evident. No intrahepatic biliary ductal enlargement. Portal vein is patent on color Doppler imaging with normal direction of blood flow towards the liver. But is a hypoechoic liver lesion measuring about 9 mm in the right lobe (image 38). And a more indistinct area of hypoechogenicity adjacent to the gallbladder fossa (series 7, image 109). And possibly a 2nd 2 cm lesion at the caudate (image 17, 44) which are not correlated on the 2018 CT. Other: Negative visible right kidney. IMPRESSION: 1. Evidence of hepatic steatosis with several small indeterminate liver lesions (2 cm or smaller) which are not correlated on a 2018 CT. Given no other clear explanation for right upper quadrant pain recommend a follow-up Abdomen MRI (preferred) or CT (liver protocol, without and with contrast) to further characterize. 2. Negative gallbladder.  No evidence of bile duct obstruction. Electronically Signed   By: Genevie Ann M.D.   On: 06/01/2020 08:43    ROS:  As stated above in the HPI otherwise negative.  Blood pressure 120/79, pulse (!) 48, temperature 97.9 F (36.6 C), resp. rate 17, SpO2 96 %.    PE: Gen: NAD, Alert and Oriented HEENT:  [place]/AT, EOMI Neck: Supple, no LAD Lungs: CTA Bilaterally CV: RRR without M/G/R ABD: Soft, NTND, +BS Ext: No C/C/E  Assessment/Plan: 1) Acute pancreatitis. 2) Diarrhea. 3) Uncontrolled DM.   The patient's clinical presentation and his elevated lipase is consistent with a diagnosis of acute pancreatitis.  He is feeling better with pain control, but his PO intake is limited.  He is not tachycardic and his BUN is not elevated.  His HCT is baseline and he does not appear dehydrated.  It is not clear why his DM is harder to control with his weight loss, but the scan does not show any evidence of pancreatic masses.  New onset pancreatitis can be a harbinger to pancreatic cancer and one of his aunts presented in this manner.  Review of his blood work in 2018 does suggest  that his hyperglycemia is not new.  Plan: 1) Continue with IV hydration. 2) Pain control. 3) As of right now he will continue with the outpatient EGD/Colonoscopy for his diarrheal symptoms. 4) He may require an EUS in 2 months to check for microlithiasis as a source of his pancreatitis and to check the pancreatic parenchyma.  Keauna Brasel D 06/01/2020, 10:21 PM

## 2020-06-01 NOTE — ED Notes (Signed)
Dr Benson Norway [phone] would like an update when possible, says pt has pancreatitis

## 2020-06-01 NOTE — ED Provider Notes (Signed)
[facility] EMERGENCY DEPARTMENT Provider Note   CSN: [id number] Arrival date & time: 05/31/20  2154     History Chief Complaint  Patient presents with  . Abdominal Pain    Jonathan Melendez is a 50 y.o. male.  HPI Patient presents after acute worsening of pain that has been present for about 4 months.  He notes that about 4 months ago without clear precipitant he developed pain in the right lateral abdomen.  There is associated anorexia, and the pain is worse with eating.  He has been seen and evaluated multiple times, by multiple physicians including a gastroenterologist.  He has had a CT scan, without reported notable results. Is scheduled for colonoscopy in 2 weeks.  Over the past days he has developed worsening pain in the same distribution, right upper abdomen, circumferential around the hemiperitoneum.  As above, there is pain that is worse with p.o. intake, improved with fasting.  No vomiting, no fever, no chest pain, no dyspnea.  He was seen and evaluated urgent care yesterday, and with abnormal labs was referred here for evaluation.    Past Medical History:  Diagnosis Date  . Diabetes mellitus without complication ([place])   . Hypertension     Patient Active Problem List   Diagnosis Date Noted  . Essential hypertension 07/24/2019  . H/O non-insulin dependent diabetes mellitus 07/24/2019  . Hyperlipidemia 07/24/2019  . Chest pain of uncertain etiology 08/15/9483    History reviewed. No pertinent surgical history.     History reviewed. No pertinent family history.  Social History   Tobacco Use  . Smoking status: Never Smoker  . Smokeless tobacco: Never Used    Home Medications Prior to Admission medications   Medication Sig Start Date End Date Taking? Authorizing Provider  atorvastatin (LIPITOR) 20 MG tablet Take 20 mg by mouth daily.    [provider]  glimepiride (AMARYL) 2 MG tablet Take 2 mg by mouth 2 (two) times daily. 05/30/19    [provider]  insulin degludec (TRESIBA FLEXTOUCH) 100 UNIT/ML FlexTouch Pen Inject 8-10 mLs into the skin daily.     [provider]  metFORMIN (GLUCOPHAGE-XR) 500 MG 24 hr tablet Take 500 mg by mouth in the morning and at bedtime.     [provider]  metoprolol succinate (TOPROL-XL) 50 MG 24 hr tablet Take 50 mg by mouth daily.    [provider]  nitroGLYCERIN (NITROSTAT) 0.4 MG SL tablet Place 1 tablet (0.4 mg total) under the tongue every 5 (five) minutes as needed for chest pain. Max 3 doses in 15 minutes 07/24/19 10/22/19  Lorretta Harp, MD    Allergies    Patient has no known allergies.  Review of Systems   Review of Systems  Constitutional:       Per HPI, otherwise negative  HENT:       Per HPI, otherwise negative  Respiratory:       Per HPI, otherwise negative  Cardiovascular:       Per HPI, otherwise negative  Gastrointestinal: Positive for abdominal pain and nausea. Negative for vomiting.  Endocrine:       Negative aside from HPI  Genitourinary:       Neg aside from HPI   Musculoskeletal:       Per HPI, otherwise negative  Skin: Negative.   Neurological: Negative for syncope.    Physical Exam Updated Vital Signs BP (!) 148/92   Pulse (!) 52   Temp 98.7 F (  37.1 C) (Oral)   Resp 19   SpO2 98%   Physical Exam Vitals and nursing note reviewed.  Constitutional:      General: He is not in acute distress.    Appearance: He is well-developed.  HENT:     Head: Normocephalic and atraumatic.  Eyes:     Conjunctiva/sclera: Conjunctivae normal.  Cardiovascular:     Rate and Rhythm: Normal rate and regular rhythm.  Pulmonary:     Effort: Pulmonary effort is normal. No respiratory distress.     Breath sounds: No stridor.  Abdominal:     General: There is no distension.     Tenderness: There is abdominal tenderness.     Comments: Abdominal tenderness just superior and lateral to the right of the umbilicus, there is  guarding, no rebound  Skin:    General: Skin is warm and dry.  Neurological:     Mental Status: He is alert and oriented to person, place, and time.      ED Results / Procedures / Treatments   Labs (all labs ordered are listed, but only abnormal results are displayed) Labs Reviewed  LIPASE, BLOOD - Abnormal; Notable for the following components:      Result Value   Lipase 82 (*)    All other components within normal limits  COMPREHENSIVE METABOLIC PANEL - Abnormal; Notable for the following components:   Glucose, Bld 162 (*)    All other components within normal limits  CBC - Abnormal; Notable for the following components:   WBC 12.3 (*)    Hemoglobin 17.3 (*)    All other components within normal limits  URINALYSIS, ROUTINE W REFLEX MICROSCOPIC - Abnormal; Notable for the following components:   APPearance HAZY (*)    Ketones, ur 20 (*)    All other components within normal limits    EKG EKG Interpretation  Date/Time:  Wednesday June 01 2020 08:36:56 EDT Ventricular Rate:  58 PR Interval:  140 QRS Duration: 101 QT Interval:  439 QTC Calculation: 432 R Axis:   61 Text Interpretation: Sinus rhythm Normal ECG Confirmed by Carmin Muskrat [phone]) on 06/01/2020 8:38:30 AM   Radiology US Abdomen Limited  Result Date: 06/01/2020 CLINICAL DATA:  50 year old male with 6 months of right upper quadrant pain. EXAM: ULTRASOUND ABDOMEN LIMITED RIGHT UPPER QUADRANT COMPARISON:  CT Abdomen and Pelvis 09/05/2016. FINDINGS: Gallbladder: No gallstones or wall thickening visualized. No sonographic Murphy sign noted by sonographer. Common bile duct: Diameter: 4 mm, normal. Liver: Echogenic liver (images 20, 34). No hepatomegaly is evident. No intrahepatic biliary ductal enlargement. Portal vein is patent on color Doppler imaging with normal direction of blood flow towards the liver. But is a hypoechoic liver lesion measuring about 9 mm in the right lobe (image 38). And a more indistinct  area of hypoechogenicity adjacent to the gallbladder fossa (series 7, image 109). And possibly a 2nd 2 cm lesion at the caudate (image 17, 44) which are not correlated on the 2018 CT. Other: Negative visible right kidney. IMPRESSION: 1. Evidence of hepatic steatosis with several small indeterminate liver lesions (2 cm or smaller) which are not correlated on a 2018 CT. Given no other clear explanation for right upper quadrant pain recommend a follow-up Abdomen MRI (preferred) or CT (liver protocol, without and with contrast) to further characterize. 2. Negative gallbladder.  No evidence of bile duct obstruction. Electronically Signed   By: Genevie Ann M.D.   On: 06/01/2020 08:43    Procedures Procedures  Medications Ordered in ED Medications  morphine 4 MG/ML injection 4 mg (4 mg Intravenous Given 06/01/20 0825)  sodium chloride 0.9 % bolus 1,000 mL (0 mLs Intravenous Stopped 06/01/20 0939)  morphine 4 MG/ML injection 4 mg (4 mg Intravenous Given 06/01/20 6294)    ED Course  I have reviewed the triage vital signs and the nursing notes.  Pertinent labs & imaging results that were available during my care of the patient were reviewed by me and considered in my medical decision making (see chart for details).   Update: On repeat exam the patient continues to have pain, though diminished after receiving morphine initially. I reviewed the patient's ultrasound, and discussed it with his gastroenterologist will follow as a consulting physician.  With abnormal ultrasound, CT has been ordered.  Given persistent pain, some suspicion for pancreatitis, though this may be secondary to other processes, the patient will require admission for ongoing pain management, fluid resuscitation, evaluation.  Patient aware of, amenable to plan.  MDM Rules/Calculators/A&P MDM Number of Diagnoses or Management Options Epigastric pain: established, worsening   Amount and/or Complexity of Data Reviewed Clinical lab tests:  ordered and reviewed Tests in the radiology section of CPT: ordered and reviewed Tests in the medicine section of CPT: reviewed and ordered Decide to obtain previous medical records or to obtain history from someone other than the patient: yes Obtain history from someone other than the patient: yes Review and summarize past medical records: yes Discuss the patient with other providers: yes Independent visualization of images, tracings, or specimens: yes  Risk of Complications, Morbidity, and/or Mortality Presenting problems: high Diagnostic procedures: high Management options: high  Critical Care Total time providing critical care: < 30 minutes  Patient Progress Patient progress: stable  Final Clinical Impression(s) / ED Diagnoses Final diagnoses:  Epigastric pain     Carmin Muskrat, MD 06/01/20 [phone]

## 2020-06-01 NOTE — ED Notes (Signed)
Pt returned from CT °

## 2020-06-02 DIAGNOSIS — R1011 Right upper quadrant pain: Secondary | ICD-10-CM

## 2020-06-02 DIAGNOSIS — K8500 Idiopathic acute pancreatitis without necrosis or infection: Secondary | ICD-10-CM

## 2020-06-02 LAB — COMPREHENSIVE METABOLIC PANEL WITH GFR
ALT: 40 U/L (ref 0–44)
AST: 21 U/L (ref 15–41)
Albumin: 3.5 g/dL (ref 3.5–5.0)
Alkaline Phosphatase: 53 U/L (ref 38–126)
Anion gap: 10 (ref 5–15)
BUN: <5 mg/dL — ABNORMAL LOW (ref 6–20)
CO2: 24 mmol/L (ref 22–32)
Calcium: 8.6 mg/dL — ABNORMAL LOW (ref 8.9–10.3)
Chloride: 103 mmol/L (ref 98–111)
Creatinine, Ser: 0.79 mg/dL (ref 0.61–1.24)
GFR, Estimated: >60 mL/min (ref >60)
Glucose, Bld: 160 mg/dL — ABNORMAL HIGH (ref 70–99)
Potassium: 3.8 mmol/L (ref 3.5–5.1)
Sodium: 137 mmol/L (ref 135–145)
Total Bilirubin: 1.3 mg/dL — ABNORMAL HIGH (ref 0.3–1.2)
Total Protein: 5.7 g/dL — ABNORMAL LOW (ref 6.5–8.1)

## 2020-06-02 LAB — CBC
HCT: 43.5 % (ref 39.0–52.0)
Hemoglobin: 15.3 g/dL (ref 13.0–17.0)
MCH: 30.5 pg (ref 26.0–34.0)
MCHC: 35.2 g/dL (ref 30.0–36.0)
MCV: 86.7 fL (ref 80.0–100.0)
Platelets: 194 K/uL (ref 150–400)
RBC: 5.02 MIL/uL (ref 4.22–5.81)
RDW: 12.2 % (ref 11.5–15.5)
WBC: 8.6 K/uL (ref 4.0–10.5)
nRBC: 0 % (ref 0.0–0.2)

## 2020-06-02 LAB — GLUCOSE, CAPILLARY
Glucose-Capillary: 165 mg/dL — ABNORMAL HIGH (ref 70–99)
Glucose-Capillary: 157 mg/dL — ABNORMAL HIGH (ref 70–99)
Glucose-Capillary: 162 mg/dL — ABNORMAL HIGH (ref 70–99)
Glucose-Capillary: 248 mg/dL — ABNORMAL HIGH (ref 70–99)
Glucose-Capillary: 96 mg/dL (ref 70–99)
Glucose-Capillary: 188 mg/dL — ABNORMAL HIGH (ref 70–99)

## 2020-06-02 NOTE — Plan of Care (Signed)
  Problem: Nutrition: Goal: Adequate nutrition will be maintained Outcome: Progressing   

## 2020-06-02 NOTE — Plan of Care (Signed)
  Problem: Activity: Goal: Risk for activity intolerance will decrease Outcome: Progressing   

## 2020-06-02 NOTE — Progress Notes (Addendum)
PROGRESS NOTE    Jonathan Melendez  WTU:[id number] DOB: 04-05-1970 DOA: 05/31/2020 PCP: London Pepper, MD   Brief Narrative: Jonathan Melendez is a 50 y.o. male with a history of hypertension, ankylosing spondylitis, diabetes mellitus. Patient presented secondary to abdominal pain in setting of acute pancreatitis. CT scan reassuring. GI consulted.   Assessment & Plan:   Principal Problem:   Abdominal pain Active Problems:   Essential hypertension   Dyslipidemia   Diabetes mellitus without complication (HCC)   Ankylosing spondylitis (HCC)   Acute pancreatitis Likely etiology of abdominal pain. Recent lipase of 301 from 4/12. CT scan without acute pathology noted of the pancreas. Pain is somewhat improved. GI consulted on admission. -GI recommendations: Advance diet; no endoscopies this admission  Primary hypertension -Continue Toprol XL  Hyperlipidemia -Continue Lipitor  Lung nodule Incidental finding. 4 mm. Non-smoker. Recommendation for follow-up CT in 6-12 months.  Diabetes mellitus, type 2 Patient is on metformin and Amaryl as an outpatient. Hemoglobin A1C of 9.1%. Will likely need change in therapy as an outpatient. -Continue SSI inpatient  Ankylosing spondylitis Noted. Does not seem to be related to current presentation.   DVT prophylaxis: SCDs Code Status:   Code Status: Full Code Family Communication: None at bedside Disposition Plan: Discharge home in 24 hours if tolerating PO intake   Consultants:   Gastroenterology (Drs Mann/Hung)  Procedures:   None  Antimicrobials:  None    Subjective: Abdominal pain was better until eating breakfast with worsening RUQ pain. No nausea/vomiting.  Objective: Vitals:   06/01/20 1509 06/01/20 2111 06/02/20 0448 06/02/20 1020  BP: 130/87 120/79 130/87 140/89  Pulse: (!) 54 (!) 48 (!) 59 63  Resp: 16 17 18 18   Temp: 98.4 F (36.9 C) 97.9 F (36.6 C) 98.2 F (36.8 C) 98.2 F (36.8 C)  TempSrc:       SpO2: 96% 96% 94% 97%    Intake/Output Summary (Last 24 hours) at 06/02/2020 1323 Last data filed at 06/02/2020 0700 Gross per 24 hour  Intake 1954.67 ml  Output 0 ml  Net 1954.67 ml   There were no vitals filed for this visit.  Examination:  General exam: Appears calm and comfortable Respiratory system: Clear to auscultation. Respiratory effort normal. Cardiovascular system: S1 & S2 heard, RRR. No murmurs, rubs, gallops or clicks. Gastrointestinal system: Abdomen is nondistended, soft and mildly tender in RUQ. No organomegaly or masses felt. Normal bowel sounds heard. Central nervous system: Alert and oriented. No focal neurological deficits. Musculoskeletal: No edema. No calf tenderness Skin: No cyanosis. No rashes Psychiatry: Judgement and insight appear normal. Mood & affect appropriate.     Data Reviewed: I have personally reviewed following labs and imaging studies  CBC Lab Results  Component Value Date   WBC 8.6 06/02/2020   RBC 5.02 06/02/2020   HGB 15.3 06/02/2020   HCT 43.5 06/02/2020   MCV 86.7 06/02/2020   MCH 30.5 06/02/2020   PLT 194 06/02/2020   MCHC 35.2 06/02/2020   RDW 12.2 17/91/5056     Last metabolic panel Lab Results  Component Value Date   NA 137 06/02/2020   K 3.8 06/02/2020   CL 103 06/02/2020   CO2 24 06/02/2020   BUN <5 (L) 06/02/2020   CREATININE 0.79 06/02/2020   GLUCOSE 160 (H) 06/02/2020   GFRNONAA >60 06/02/2020   GFRAA 95 08/05/2019   CALCIUM 8.6 (L) 06/02/2020   PROT 5.7 (L) 06/02/2020   ALBUMIN 3.5 06/02/2020   BILITOT 1.3 (H) 06/02/2020  ALKPHOS 53 06/02/2020   AST 21 06/02/2020   ALT 40 06/02/2020   ANIONGAP 10 06/02/2020    CBG (last 3)  Recent Labs    06/02/20 0830 06/02/20 0916 06/02/20 1218  GLUCAP 157* 162* 248*     GFR: CrCl cannot be calculated (Unknown ideal weight.).  Coagulation Profile: No results for input(s): INR, PROTIME in the last 168 hours.  Recent Results (from the past 240 hour(s))   SARS CORONAVIRUS 2 (TAT 6-24 HRS) Nasopharyngeal Nasopharyngeal Swab     Status: None   Collection Time: 06/01/20 11:35 AM   Specimen: Nasopharyngeal Swab  Result Value Ref Range Status   SARS Coronavirus 2 NEGATIVE NEGATIVE Final    Comment: (NOTE) SARS-CoV-2 target nucleic acids are NOT DETECTED.  The SARS-CoV-2 RNA is generally detectable in upper and lower respiratory specimens during the acute phase of infection. Negative results do not preclude SARS-CoV-2 infection, do not rule out co-infections with other pathogens, and should not be used as the sole basis for treatment or other patient management decisions. Negative results must be combined with clinical observations, patient history, and epidemiological information. The expected result is Negative.  Fact Sheet for Patients: SugarRoll.be  Fact Sheet for Healthcare Providers: https://www.woods-mathews.com/  This test is not yet approved or cleared by the [place] FDA and  has been authorized for detection and/or diagnosis of SARS-CoV-2 by FDA under an Emergency Use Authorization (EUA). This EUA will remain  in effect (meaning this test can be used) for the duration of the COVID-19 declaration under Se ction 564(b)(1) of the Act, 21 U.S.C. section 360bbb-3(b)(1), unless the authorization is terminated or revoked sooner.  Performed at [facility], [place] [address] [place], [place] [address]         Radiology Studies: CT Abdomen Pelvis W Contrast  Result Date: 06/01/2020 CLINICAL DATA:  Liver lesion on ultrasound. Epigastric and mid abdominal pain for 2 months. Diarrhea. EXAM: CT ABDOMEN AND PELVIS WITH CONTRAST TECHNIQUE: Multidetector CT imaging of the abdomen and pelvis was performed using the standard protocol following bolus administration of intravenous contrast. CONTRAST:  168mL OMNIPAQUE IOHEXOL 300 MG/ML  SOLN COMPARISON:  Current abdominal ultrasound.  Prior  CT, 09/05/2016. FINDINGS: Lower chest: No acute findings. 4 mm nodule, right lower lobe, image 14, series 5. Hepatobiliary: No liver mass or focal lesion. The hypoechoic areas noted on the current ultrasound are most likely due to focal fatty sparing, not resolved on CT. Liver is normal in size. There is decreased overall liver attenuation consistent with hepatic steatosis. Normal gallbladder. No bile duct dilation. Pancreas: Unremarkable. No pancreatic ductal dilatation or surrounding inflammatory changes. Spleen: Normal in size without focal abnormality. Adrenals/Urinary Tract: Adrenal glands are unremarkable. Kidneys are normal, without renal calculi, focal lesion, or hydronephrosis. Bladder is unremarkable. Stomach/Bowel: Stomach is within normal limits. Appendix appears normal. No evidence of bowel wall thickening, distention, or inflammatory changes. Vascular/Lymphatic: No significant vascular findings are present. No enlarged abdominal or pelvic lymph nodes. Reproductive: Unremarkable. Other: No abdominal wall hernia or abnormality. No abdominopelvic ascites. Musculoskeletal: No fracture or acute finding. No bone lesion. Disc degenerative changes at L4-L5 and L5-S1. IMPRESSION: 1. No acute findings.  No evidence of abscess/infection. 2. No liver mass or focal lesion. The hypoechoic lesions noted on the current right upper quadrant ultrasound are most likely due to areas of focal fat that are not resolved on CT. 3. Hepatic steatosis.  Lower lumbar spine disc degenerative changes. 4. 4 mm nodule, right lower lobe.  Given the apparent development of this nodule since the prior CT, follow-up chest CT in 6-12 months is recommended. Electronically Signed   By: Lajean Manes M.D.   On: 06/01/2020 10:56   US Abdomen Limited  Result Date: 06/01/2020 CLINICAL DATA:  50 year old male with 6 months of right upper quadrant pain. EXAM: ULTRASOUND ABDOMEN LIMITED RIGHT UPPER QUADRANT COMPARISON:  CT Abdomen and Pelvis  09/05/2016. FINDINGS: Gallbladder: No gallstones or wall thickening visualized. No sonographic Murphy sign noted by sonographer. Common bile duct: Diameter: 4 mm, normal. Liver: Echogenic liver (images 20, 34). No hepatomegaly is evident. No intrahepatic biliary ductal enlargement. Portal vein is patent on color Doppler imaging with normal direction of blood flow towards the liver. But is a hypoechoic liver lesion measuring about 9 mm in the right lobe (image 38). And a more indistinct area of hypoechogenicity adjacent to the gallbladder fossa (series 7, image 109). And possibly a 2nd 2 cm lesion at the caudate (image 17, 44) which are not correlated on the 2018 CT. Other: Negative visible right kidney. IMPRESSION: 1. Evidence of hepatic steatosis with several small indeterminate liver lesions (2 cm or smaller) which are not correlated on a 2018 CT. Given no other clear explanation for right upper quadrant pain recommend a follow-up Abdomen MRI (preferred) or CT (liver protocol, without and with contrast) to further characterize. 2. Negative gallbladder.  No evidence of bile duct obstruction. Electronically Signed   By: Genevie Ann M.D.   On: 06/01/2020 08:43        Scheduled Meds: . atorvastatin  20 mg Oral Daily  . insulin aspart  0-15 Units Subcutaneous TID WC  . insulin aspart  0-5 Units Subcutaneous QHS  . metoprolol succinate  50 mg Oral Daily   Continuous Infusions: . lactated ringers 100 mL/hr at 06/02/20 1106     LOS: 1 day     Cordelia Poche, MD [facility] 06/02/2020, 1:23 PM  If 7PM-7AM, please contact night-coverage [email or web address]

## 2020-06-02 NOTE — Progress Notes (Addendum)
Subjective: Feeling better.  He is having some back pain.  Objective: Vital signs in last 24 hours: Temp:  [97.9 F (36.6 C)-98.4 F (36.9 C)] 98.2 F (36.8 C) (04/14 0448) Pulse Rate:  [48-69] 59 (04/14 0448) Resp:  [15-20] 18 (04/14 0448) BP: (120-159)/(79-95) 130/87 (04/14 0448) SpO2:  [94 %-99 %] 94 % (04/14 0448)    Intake/Output from previous day: 04/13 0701 - 04/14 0700 In: 1554.7 [P.O.:120; I.V.:1434.7] Out: 0  Intake/Output this shift: Total I/O In: 1554.7 [P.O.:120; I.V.:1434.7] Out: 0   General appearance: alert and no distress GI: Some tenderness in the RLQ  Lab Results: Recent Labs    05/31/20 2315 06/02/20 0315  WBC 12.3* 8.6  HGB 17.3* 15.3  HCT 50.8 43.5  PLT 251 194   BMET Recent Labs    05/31/20 2315 06/02/20 0315  NA 135 137  K 4.0 3.8  CL 100 103  CO2 30 24  GLUCOSE 162* 160*  BUN 7 <5*  CREATININE 0.95 0.79  CALCIUM 9.9 8.6*   LFT Recent Labs    06/02/20 0315  PROT 5.7*  ALBUMIN 3.5  AST 21  ALT 40  ALKPHOS 53  BILITOT 1.3*   PT/INR No results for input(s): LABPROT, INR in the last 72 hours. Hepatitis Panel No results for input(s): HEPBSAG, HCVAB, HEPAIGM, HEPBIGM in the last 72 hours. C-Diff No results for input(s): CDIFFTOX in the last 72 hours. Fecal Lactopherrin No results for input(s): FECLLACTOFRN in the last 72 hours.  Studies/Results: CT Abdomen Pelvis W Contrast  Result Date: 06/01/2020 CLINICAL DATA:  Liver lesion on ultrasound. Epigastric and mid abdominal pain for 2 months. Diarrhea. EXAM: CT ABDOMEN AND PELVIS WITH CONTRAST TECHNIQUE: Multidetector CT imaging of the abdomen and pelvis was performed using the standard protocol following bolus administration of intravenous contrast. CONTRAST:  183mL OMNIPAQUE IOHEXOL 300 MG/ML  SOLN COMPARISON:  Current abdominal ultrasound.  Prior CT, 09/05/2016. FINDINGS: Lower chest: No acute findings. 4 mm nodule, right lower lobe, image 14, series 5. Hepatobiliary: No liver  mass or focal lesion. The hypoechoic areas noted on the current ultrasound are most likely due to focal fatty sparing, not resolved on CT. Liver is normal in size. There is decreased overall liver attenuation consistent with hepatic steatosis. Normal gallbladder. No bile duct dilation. Pancreas: Unremarkable. No pancreatic ductal dilatation or surrounding inflammatory changes. Spleen: Normal in size without focal abnormality. Adrenals/Urinary Tract: Adrenal glands are unremarkable. Kidneys are normal, without renal calculi, focal lesion, or hydronephrosis. Bladder is unremarkable. Stomach/Bowel: Stomach is within normal limits. Appendix appears normal. No evidence of bowel wall thickening, distention, or inflammatory changes. Vascular/Lymphatic: No significant vascular findings are present. No enlarged abdominal or pelvic lymph nodes. Reproductive: Unremarkable. Other: No abdominal wall hernia or abnormality. No abdominopelvic ascites. Musculoskeletal: No fracture or acute finding. No bone lesion. Disc degenerative changes at L4-L5 and L5-S1. IMPRESSION: 1. No acute findings.  No evidence of abscess/infection. 2. No liver mass or focal lesion. The hypoechoic lesions noted on the current right upper quadrant ultrasound are most likely due to areas of focal fat that are not resolved on CT. 3. Hepatic steatosis.  Lower lumbar spine disc degenerative changes. 4. 4 mm nodule, right lower lobe. Given the apparent development of this nodule since the prior CT, follow-up chest CT in 6-12 months is recommended. Electronically Signed   By: Lajean Manes M.D.   On: 06/01/2020 10:56   US Abdomen Limited  Result Date: 06/01/2020 CLINICAL DATA:  50 year old male with 6  months of right upper quadrant pain. EXAM: ULTRASOUND ABDOMEN LIMITED RIGHT UPPER QUADRANT COMPARISON:  CT Abdomen and Pelvis 09/05/2016. FINDINGS: Gallbladder: No gallstones or wall thickening visualized. No sonographic Murphy sign noted by sonographer. Common  bile duct: Diameter: 4 mm, normal. Liver: Echogenic liver (images 20, 34). No hepatomegaly is evident. No intrahepatic biliary ductal enlargement. Portal vein is patent on color Doppler imaging with normal direction of blood flow towards the liver. But is a hypoechoic liver lesion measuring about 9 mm in the right lobe (image 38). And a more indistinct area of hypoechogenicity adjacent to the gallbladder fossa (series 7, image 109). And possibly a 2nd 2 cm lesion at the caudate (image 17, 44) which are not correlated on the 2018 CT. Other: Negative visible right kidney. IMPRESSION: 1. Evidence of hepatic steatosis with several small indeterminate liver lesions (2 cm or smaller) which are not correlated on a 2018 CT. Given no other clear explanation for right upper quadrant pain recommend a follow-up Abdomen MRI (preferred) or CT (liver protocol, without and with contrast) to further characterize. 2. Negative gallbladder.  No evidence of bile duct obstruction. Electronically Signed   By: Genevie Ann M.D.   On: 06/01/2020 08:43    Medications:  Scheduled: . atorvastatin  20 mg Oral Daily  . insulin aspart  0-15 Units Subcutaneous TID WC  . insulin aspart  0-5 Units Subcutaneous QHS  . metoprolol succinate  50 mg Oral Daily   Continuous: . lactated ringers 100 mL/hr at 06/02/20 0125    Assessment/Plan: 1) Acute pancreatitis. 2) DM. 3) Ankylosing spondylitis.   The patient is clinically better.  He did not consume very much of his clear liquids for dinner, but he is willing to try a eating more today.  He feels that his pain is adequately controlled with Tylenol.  No complaints of diarrhea.  Plan: 1) Advance to a regular diet. 2) ? D/C home later today versus tomorrow AM pending his ability to intake PO. 3) Continue with Tylenol. 4) [facility], Dr. Hilarie Fredrickson, is covering starting Friday and the weekend.  LOS: 1 day   Holly Pring D 06/02/2020, 6:52 AM

## 2020-06-03 DIAGNOSIS — K859 Acute pancreatitis without necrosis or infection, unspecified: Principal | ICD-10-CM

## 2020-06-03 LAB — GLUCOSE, CAPILLARY: Glucose-Capillary: 163 mg/dL — ABNORMAL HIGH (ref 70–99)

## 2020-06-03 MED ORDER — ACETAMINOPHEN 500 MG PO TABS: 500.0000 mg | ORAL_TABLET | Freq: Three times a day (TID) | ORAL | Status: DC | PRN

## 2020-06-03 NOTE — Progress Notes (Signed)
DISCHARGE NOTE HOME Jonathan Melendez to be discharged Home per MD order. Discussed prescriptions and follow up appointments with the patient. Prescriptions given to patient; medication list explained in detail. Patient verbalized understanding.  Skin clean, dry and intact without evidence of skin break down, no evidence of skin tears noted. IV catheter discontinued intact. Site without signs and symptoms of complications. Dressing and pressure applied. Pt denies pain at the site currently. No complaints noted.  Patient free of lines, drains, and wounds.   An After Visit Summary (AVS) was printed and given to the patient. Patient escorted via wheelchair, and discharged home via private auto.  Orville Govern, RN

## 2020-06-03 NOTE — Discharge Instructions (Signed)
Acute Pancreatitis    The pancreas is a gland that is located behind the stomach on the left side of the abdomen. It produces enzymes that help to digest food. The pancreas also releases the hormones glucagon and insulin, which help to regulate blood sugar. Acute pancreatitis happens when inflammation of the pancreas suddenly occurs and the pancreas becomes irritated and swollen.  Most acute attacks last a few days and cause serious problems. Some people become dehydrated and develop low blood pressure. In severe cases, bleeding in the abdomen can lead to shock and can be life-threatening. The lungs, heart, and kidneys may fail.  What are the causes?  This condition may be caused by:  · Alcohol abuse.  · Drug abuse.  · Gallstones or other conditions that can block the tube that drains the pancreas (pancreatic duct).  · A tumor in the pancreas.  Other causes include:  · Certain medicines.  · Exposure to certain chemicals.  · Diabetes.  · An infection in the pancreas.  · Damage caused by an accident (trauma).  · The poison (venom) from a scorpion bite.  · Abdominal surgery.  · Autoimmune pancreatitis. This is when the body's disease-fighting (immune) system attacks the pancreas.  · Genes that are passed from parent to child (inherited).  In some cases, the cause of this condition is not known.  What are the signs or symptoms?  Symptoms of this condition include:  · Pain in the upper abdomen that may radiate to the back. Pain may be severe.  · Tenderness and swelling of the abdomen.  · Nausea and vomiting.  · Fever.  How is this diagnosed?  This condition may be diagnosed based on:  · A physical exam.  · Blood tests.  · Imaging tests, such as X-rays, CT or MRI scans, or an ultrasound of the abdomen.  How is this treated?  Treatment for this condition usually requires a stay in the hospital. Treatment for this condition may include:  · Pain medicine.  · Fluid replacement through an IV.  · Placing a tube in the stomach  to remove stomach contents and to control vomiting (NG tube, or nasogastric tube).  · Not eating for 3-4 days. This gives the pancreas a rest, because enzymes are not being produced that can cause further damage.  · Antibiotic medicines, if your condition is caused by an infection.  · Treating any underlying conditions that may be the cause.  · Steroid medicines, if your condition is caused by your immune system attacking your body's own tissues (autoimmune disease).  · Surgery on the pancreas or gallbladder.  Follow these instructions at home:  Eating and drinking    · Follow instructions from your health care provider about diet. This may involve avoiding alcohol and decreasing the amount of fat in your diet.  · Eat smaller, more frequent meals. This reduces the amount of digestive fluids that the pancreas produces.  · Drink enough fluid to keep your urine pale yellow.  · Do not drink alcohol if it caused your condition.  General instructions  · Take over-the-counter and prescription medicines only as told by your health care provider.  · Do not drive or use heavy machinery while taking prescription pain medicine.  · Ask your health care provider if the medicine prescribed to you can cause constipation. You may need to take steps to prevent or treat constipation, such as:  ? Take an over-the-counter or prescription medicine for constipation.  ? Eat   foods that are high in fiber such as whole grains and beans.  ? Limit foods that are high in fat and processed sugars, such as fried or sweet foods.  · Do not use any products that contain nicotine or tobacco, such as cigarettes, e-cigarettes, and chewing tobacco. If you need help quitting, ask your health care provider.  · Get plenty of rest.  · If directed, check your blood sugar at home as told by your health care provider.  · Keep all follow-up visits as told by your health care provider. This is important.  Contact a health care provider if you:  · Do not recover  as quickly as expected.  · Develop new or worsening symptoms.  · Have persistent pain, weakness, or nausea.  · Recover and then have another episode of pain.  · Have a fever.  Get help right away if:  · You cannot eat or keep fluids down.  · Your pain becomes severe.  · Your skin or the white part of your eyes turns yellow (jaundice).  · You have sudden swelling in your abdomen.  · You vomit.  · You feel dizzy or you faint.  · Your blood sugar is high (over 300 mg/dL).  Summary  · Acute pancreatitis happens when inflammation of the pancreas suddenly occurs and the pancreas becomes irritated and swollen.  · This condition is typically caused by alcohol abuse, drug abuse, or gallstones.  · Treatment for this condition usually requires a stay in the hospital.  This information is not intended to replace advice given to you by your health care provider. Make sure you discuss any questions you have with your health care provider.  Document Revised: 11/25/2017 Document Reviewed: 08/12/2017  Elsevier Patient Education © 2021 [employer]

## 2020-06-03 NOTE — Discharge Summary (Signed)
Physician Discharge Summary  Jonathan Melendez DPO:[id number] DOB: 10-30-1970 DOA: 05/31/2020  PCP: [facility]  Admit date: 05/31/2020 Discharge date: 06/03/2020  Admitted From: Home Disposition: Home  Recommendations for Outpatient Follow-up:  1. Follow up with PCP in 1 week 2. Follow up with GI 3. Please follow up on the following pending results: None  Home Health: None Equipment/Devices: None  Discharge Condition: Stable CODE STATUS: Full code Diet recommendation: Low fat diet   Brief/Interim Summary:  Admission HPI written by Jonathan Bongo, Melendez   HPI: Jonathan Melendez is a 50 y.o. male with medical history significant of HTN; ankylosing spondylitis; and DM presenting with abdominal pain.  He has been having RUQ pain with radiation to the back. Some diarrhea.  Symptoms since before Christmas but worse in the last 24 hours.  He has seen Dr. Benson Norway - initially ?pancreatitis but more recently thinking it could be UC.  Diarrhea has been bloody x 3 total - once yesterday.  Diarrhea happens almost daily, usually about 1.5 hours after eating and all 3 meals.  No emesis, but he does have periodic nausea.  No fevers.  He is scheduled for above and below scopes on 4/28.  +weight loss, but he also changed his diet.   Hospital course:  Acute pancreatitis Likely etiology of abdominal pain. Recent lipase of 301 from 4/12. CT scan without acute pathology noted of the pancreas. Pain is somewhat improved. GI consulted on admission. Diet advanced successfully. Pain improved prior to discharge.  Primary hypertension Continue Toprol XL  Hyperlipidemia Continue Lipitor  Lung nodule Incidental finding. 4 mm. Non-smoker. Recommendation for follow-up CT in 6-12 months.  Diabetes mellitus, type 2 Patient is on metformin and Amaryl as an outpatient. Hemoglobin A1C of 9.1%. Will likely need change in therapy as an outpatient.  Ankylosing spondylitis Noted. Does not seem to be  related to current presentation.   Discharge Diagnoses:  Principal Problem:   Abdominal pain Active Problems:   Essential hypertension   Dyslipidemia   Diabetes mellitus without complication ([facility])   Ankylosing spondylitis [facility])    Discharge Instructions  Discharge Instructions    Call Melendez for:  severe uncontrolled pain   Complete by: As directed      Allergies as of 06/03/2020   No Known Allergies     Medication List    TAKE these medications   acetaminophen 500 MG tablet Commonly known as: TYLENOL Take 1-2 tablets (500-1,000 mg total) by mouth every 8 (eight) hours as needed for mild pain. What changed:   how much to take  when to take this   atorvastatin 20 MG tablet Commonly known as: LIPITOR Take 20 mg by mouth daily.   glimepiride 2 MG tablet Commonly known as: AMARYL Take 4 mg by mouth daily.   HYDROcodone-acetaminophen 5-325 MG tablet Commonly known as: NORCO/VICODIN Take 1 tablet by mouth every 8 (eight) hours as needed for pain.   metFORMIN 500 MG 24 hr tablet Commonly known as: GLUCOPHAGE-XR Take 1,000 mg by mouth in the morning and at bedtime.   metoprolol succinate 50 MG 24 hr tablet Commonly known as: TOPROL-XL Take 50 mg by mouth daily.   MULTIVITAMIN ADULT PO Take 1 tablet by mouth daily.   nitroGLYCERIN 0.4 MG SL tablet Commonly known as: NITROSTAT Place 1 tablet (0.4 mg total) under the tongue every 5 (five) minutes as needed for chest pain. Max 3 doses in 15 minutes       Follow-up Information  Jonathan [facility]. Schedule an appointment as soon as possible for a visit in 1 week(s).   Specialty: Family Medicine Why: Hospital follow-up Contact information: [place] [address] [phone]        Carol Ada, Melendez. Schedule an appointment as soon as possible for a visit in 1 week(s).   Specialty: Gastroenterology Why: Hospital follow-up Contact information: [address] [place] [address] [phone]              No Known Allergies  Consultations:  Gastroenterology   Procedures/Studies: CT Abdomen Pelvis W Contrast  Result Date: 06/01/2020 CLINICAL DATA:  Liver lesion on ultrasound. Epigastric and mid abdominal pain for 2 months. Diarrhea. EXAM: CT ABDOMEN AND PELVIS WITH CONTRAST TECHNIQUE: Multidetector CT imaging of the abdomen and pelvis was performed using the standard protocol following bolus administration of intravenous contrast. CONTRAST:  126mL OMNIPAQUE IOHEXOL 300 MG/ML  SOLN COMPARISON:  Current abdominal ultrasound.  Prior CT, 09/05/2016. FINDINGS: Lower chest: No acute findings. 4 mm nodule, right lower lobe, image 14, series 5. Hepatobiliary: No liver mass or focal lesion. The hypoechoic areas noted on the current ultrasound are most likely due to focal fatty sparing, not resolved on CT. Liver is normal in size. There is decreased overall liver attenuation consistent with hepatic steatosis. Normal gallbladder. No bile duct dilation. Pancreas: Unremarkable. No pancreatic ductal dilatation or surrounding inflammatory changes. Spleen: Normal in size without focal abnormality. Adrenals/Urinary Tract: Adrenal glands are unremarkable. Kidneys are normal, without renal calculi, focal lesion, or hydronephrosis. Bladder is unremarkable. Stomach/Bowel: Stomach is within normal limits. Appendix appears normal. No evidence of bowel wall thickening, distention, or inflammatory changes. Vascular/Lymphatic: No significant vascular findings are present. No enlarged abdominal or pelvic lymph nodes. Reproductive: Unremarkable. Other: No abdominal wall hernia or abnormality. No abdominopelvic ascites. Musculoskeletal: No fracture or acute finding. No bone lesion. Disc degenerative changes at L4-L5 and L5-S1. IMPRESSION: 1. No acute findings.  No evidence of abscess/infection. 2. No liver mass or focal lesion. The hypoechoic lesions noted on the current  right upper quadrant ultrasound are most likely due to areas of focal fat that are not resolved on CT. 3. Hepatic steatosis.  Lower lumbar spine disc degenerative changes. 4. 4 mm nodule, right lower lobe. Given the apparent development of this nodule since the prior CT, follow-up chest CT in 6-12 months is recommended. Electronically Signed   By: Lajean Manes M.D.   On: 06/01/2020 10:56   US Abdomen Limited  Result Date: 06/01/2020 CLINICAL DATA:  50 year old male with 6 months of right upper quadrant pain. EXAM: ULTRASOUND ABDOMEN LIMITED RIGHT UPPER QUADRANT COMPARISON:  CT Abdomen and Pelvis 09/05/2016. FINDINGS: Gallbladder: No gallstones or wall thickening visualized. No sonographic Murphy sign noted by sonographer. Common bile duct: Diameter: 4 mm, normal. Liver: Echogenic liver (images 20, 34). No hepatomegaly is evident. No intrahepatic biliary ductal enlargement. Portal vein is patent on color Doppler imaging with normal direction of blood flow towards the liver. But is a hypoechoic liver lesion measuring about 9 mm in the right lobe (image 38). And a more indistinct area of hypoechogenicity adjacent to the gallbladder fossa (series 7, image 109). And possibly a 2nd 2 cm lesion at the caudate (image 17, 44) which are not correlated on the 2018 CT. Other: Negative visible right kidney. IMPRESSION: 1. Evidence of hepatic steatosis with several small indeterminate liver lesions (2 cm or smaller) which are not correlated on a 2018 CT. Given no  other clear explanation for right upper quadrant pain recommend a follow-up Abdomen MRI (preferred) or CT (liver protocol, without and with contrast) to further characterize. 2. Negative gallbladder.  No evidence of bile duct obstruction. Electronically Signed   By: Genevie Ann M.D.   On: 06/01/2020 08:43       Subjective: Tolerated dinner and breakfast. No pain.  Discharge Exam: Vitals:   06/02/20 2014 06/03/20 0411  BP: 125/83 136/85  Pulse: (!) 50 (!) 52   Resp: 18 16  Temp: 98.8 F (37.1 C) 98.4 F (36.9 C)  SpO2: 95% 97%   Vitals:   06/02/20 1020 06/02/20 1703 06/02/20 2014 06/03/20 0411  BP: 140/89 131/89 125/83 136/85  Pulse: 63 (!) 50 (!) 50 (!) 52  Resp: 18 16 18 16   Temp: 98.2 F (36.8 C) 97.7 F (36.5 C) 98.8 F (37.1 C) 98.4 F (36.9 C)  TempSrc:   Oral   SpO2: 97% 96% 95% 97%  Weight:        General: Pt is alert, awake, not in acute distress Cardiovascular: RRR, S1/S2 +, no rubs, no gallops Respiratory: CTA bilaterally, no wheezing, no rhonchi Abdominal: Soft, NT, ND, bowel sounds + Extremities: no edema, no cyanosis    The results of significant diagnostics from this hospitalization (including imaging, microbiology, ancillary and laboratory) are listed below for reference.     Microbiology: Recent Results (from the past 240 hour(s))  SARS CORONAVIRUS 2 (TAT 6-24 HRS) Nasopharyngeal Nasopharyngeal Swab     Status: None   Collection Time: 06/01/20 11:35 AM   Specimen: Nasopharyngeal Swab  Result Value Ref Range Status   SARS Coronavirus 2 NEGATIVE NEGATIVE Final    Comment: (NOTE) SARS-CoV-2 target nucleic acids are NOT DETECTED.  The SARS-CoV-2 RNA is generally detectable in upper and lower respiratory specimens during the acute phase of infection. Negative results do not preclude SARS-CoV-2 infection, do not rule out co-infections with other pathogens, and should not be used as the sole basis for treatment or other patient management decisions. Negative results must be combined with clinical observations, patient history, and epidemiological information. The expected result is Negative.  Fact Sheet for Patients: SugarRoll.be  Fact Sheet for Healthcare Providers: https://www.woods-mathews.com/  This test is not yet approved or cleared by the [place] FDA and  has been authorized for detection and/or diagnosis of SARS-CoV-2 by FDA under an Emergency Use  Authorization (EUA). This EUA will remain  in effect (meaning this test can be used) for the duration of the COVID-19 declaration under Se ction 564(b)(1) of the Act, 21 U.S.C. section 360bbb-3(b)(1), unless the authorization is terminated or revoked sooner.  Performed at [facility], [place] [address] [place],  [address]      Labs: BNP (last 3 results) No results for input(s): BNP in the last 8760 hours. Basic Metabolic Panel: Recent Labs  Lab 05/31/20 2315 06/02/20 0315  NA 135 137  K 4.0 3.8  CL 100 103  CO2 30 24  GLUCOSE 162* 160*  BUN 7 <5*  CREATININE 0.95 0.79  CALCIUM 9.9 8.6*   Liver Function Tests: Recent Labs  Lab 05/31/20 2315 06/02/20 0315  AST 20 21  ALT 43 40  ALKPHOS 65 53  BILITOT 1.2 1.3*  PROT 6.9 5.7*  ALBUMIN 4.2 3.5   Recent Labs  Lab 05/31/20 1357 05/31/20 2315  LIPASE 301* 82*   No results for input(s): AMMONIA in the last 168 hours. CBC: Recent Labs  Lab 05/31/20 2315 06/02/20  0315  WBC 12.3* 8.6  HGB 17.3* 15.3  HCT 50.8 43.5  MCV 88.5 86.7  PLT 251 194   Cardiac Enzymes: No results for input(s): CKTOTAL, CKMB, CKMBINDEX, TROPONINI in the last 168 hours. BNP: Invalid input(s): POCBNP CBG: Recent Labs  Lab 06/02/20 0916 06/02/20 1218 06/02/20 1705 06/02/20 2053 06/03/20 0633  GLUCAP 162* 248* 96 188* 163*   D-Dimer No results for input(s): DDIMER in the last 72 hours. Hgb A1c Recent Labs    06/01/20 1656  HGBA1C 9.1*   Lipid Profile No results for input(s): CHOL, HDL, LDLCALC, TRIG, CHOLHDL, LDLDIRECT in the last 72 hours. Thyroid function studies No results for input(s): TSH, T4TOTAL, T3FREE, THYROIDAB in the last 72 hours.  Invalid input(s): FREET3 Anemia work up No results for input(s): VITAMINB12, FOLATE, FERRITIN, TIBC, IRON, RETICCTPCT in the last 72 hours. Urinalysis    Component Value Date/Time   COLORURINE YELLOW 05/31/2020 2307   APPEARANCEUR HAZY (A) 05/31/2020 2307   LABSPEC  1.019 05/31/2020 2307   PHURINE 5.0 05/31/2020 2307   GLUCOSEU NEGATIVE 05/31/2020 2307   HGBUR NEGATIVE 05/31/2020 2307   BILIRUBINUR NEGATIVE 05/31/2020 2307   KETONESUR 20 (A) 05/31/2020 2307   PROTEINUR NEGATIVE 05/31/2020 2307   NITRITE NEGATIVE 05/31/2020 2307   LEUKOCYTESUR NEGATIVE 05/31/2020 2307   Sepsis Labs Invalid input(s): PROCALCITONIN,  WBC,  LACTICIDVEN Microbiology Recent Results (from the past 240 hour(s))  SARS CORONAVIRUS 2 (TAT 6-24 HRS) Nasopharyngeal Nasopharyngeal Swab     Status: None   Collection Time: 06/01/20 11:35 AM   Specimen: Nasopharyngeal Swab  Result Value Ref Range Status   SARS Coronavirus 2 NEGATIVE NEGATIVE Final    Comment: (NOTE) SARS-CoV-2 target nucleic acids are NOT DETECTED.  The SARS-CoV-2 RNA is generally detectable in upper and lower respiratory specimens during the acute phase of infection. Negative results do not preclude SARS-CoV-2 infection, do not rule out co-infections with other pathogens, and should not be used as the sole basis for treatment or other patient management decisions. Negative results must be combined with clinical observations, patient history, and epidemiological information. The expected result is Negative.  Fact Sheet for Patients: SugarRoll.be  Fact Sheet for Healthcare Providers: https://www.woods-mathews.com/  This test is not yet approved or cleared by the [place] FDA and  has been authorized for detection and/or diagnosis of SARS-CoV-2 by FDA under an Emergency Use Authorization (EUA). This EUA will remain  in effect (meaning this test can be used) for the duration of the COVID-19 declaration under Se ction 564(b)(1) of the Act, 21 U.S.C. section 360bbb-3(b)(1), unless the authorization is terminated or revoked sooner.  Performed at [facility], [place] [address], [place], [place] [address]      Time coordinating discharge: 35  minutes  SIGNED:   Cordelia Poche, Melendez [facility] 06/03/2020, 4:41 PM

## 2021-05-24 ENCOUNTER — Encounter (HOSPITAL_BASED_OUTPATIENT_CLINIC_OR_DEPARTMENT_OTHER): Payer: Self-pay | Admitting: Obstetrics and Gynecology

## 2021-05-24 ENCOUNTER — Other Ambulatory Visit: Payer: Self-pay

## 2021-05-24 ENCOUNTER — Inpatient Hospital Stay (HOSPITAL_BASED_OUTPATIENT_CLINIC_OR_DEPARTMENT_OTHER)
Admission: EM | Admit: 2021-05-24 | Discharge: 2021-05-26 | DRG: 661 | Disposition: A | Discharge status: Home / Self Care | Payer: BC Managed Care – PPO | Attending: Internal Medicine | Admitting: Internal Medicine

## 2021-05-24 ENCOUNTER — Emergency Department (HOSPITAL_BASED_OUTPATIENT_CLINIC_OR_DEPARTMENT_OTHER): Payer: BC Managed Care – PPO

## 2021-05-24 DIAGNOSIS — E669 Obesity, unspecified: Secondary | ICD-10-CM | POA: Diagnosis present

## 2021-05-24 DIAGNOSIS — Z79899 Other long term (current) drug therapy: Secondary | ICD-10-CM

## 2021-05-24 DIAGNOSIS — R823 Hemoglobinuria: Secondary | ICD-10-CM | POA: Diagnosis not present

## 2021-05-24 DIAGNOSIS — I10 Essential (primary) hypertension: Secondary | ICD-10-CM | POA: Diagnosis present

## 2021-05-24 DIAGNOSIS — E785 Hyperlipidemia, unspecified: Secondary | ICD-10-CM | POA: Diagnosis not present

## 2021-05-24 DIAGNOSIS — Z87442 Personal history of urinary calculi: Secondary | ICD-10-CM | POA: Diagnosis not present

## 2021-05-24 DIAGNOSIS — R81 Glycosuria: Secondary | ICD-10-CM | POA: Diagnosis present

## 2021-05-24 DIAGNOSIS — R001 Bradycardia, unspecified: Secondary | ICD-10-CM | POA: Diagnosis not present

## 2021-05-24 DIAGNOSIS — R7989 Other specified abnormal findings of blood chemistry: Secondary | ICD-10-CM | POA: Diagnosis not present

## 2021-05-24 DIAGNOSIS — Z6828 Body mass index (BMI) 28.0-28.9, adult: Secondary | ICD-10-CM | POA: Diagnosis not present

## 2021-05-24 DIAGNOSIS — R109 Unspecified abdominal pain: Secondary | ICD-10-CM | POA: Diagnosis present

## 2021-05-24 DIAGNOSIS — Z7984 Long term (current) use of oral hypoglycemic drugs: Secondary | ICD-10-CM

## 2021-05-24 DIAGNOSIS — R911 Solitary pulmonary nodule: Secondary | ICD-10-CM | POA: Diagnosis not present

## 2021-05-24 DIAGNOSIS — N209 Urinary calculus, unspecified: Secondary | ICD-10-CM | POA: Diagnosis present

## 2021-05-24 DIAGNOSIS — M459 Ankylosing spondylitis of unspecified sites in spine: Secondary | ICD-10-CM | POA: Diagnosis not present

## 2021-05-24 DIAGNOSIS — E0865 Diabetes mellitus due to underlying condition with hyperglycemia: Secondary | ICD-10-CM

## 2021-05-24 DIAGNOSIS — R824 Acetonuria: Secondary | ICD-10-CM | POA: Diagnosis not present

## 2021-05-24 DIAGNOSIS — Z801 Family history of malignant neoplasm of trachea, bronchus and lung: Secondary | ICD-10-CM | POA: Diagnosis not present

## 2021-05-24 DIAGNOSIS — N1330 Unspecified hydronephrosis: Secondary | ICD-10-CM | POA: Diagnosis not present

## 2021-05-24 DIAGNOSIS — E663 Overweight: Secondary | ICD-10-CM | POA: Diagnosis present

## 2021-05-24 DIAGNOSIS — R1110 Vomiting, unspecified: Secondary | ICD-10-CM

## 2021-05-24 DIAGNOSIS — N132 Hydronephrosis with renal and ureteral calculous obstruction: Principal | ICD-10-CM | POA: Diagnosis present

## 2021-05-24 DIAGNOSIS — E1165 Type 2 diabetes mellitus with hyperglycemia: Secondary | ICD-10-CM | POA: Diagnosis not present

## 2021-05-24 DIAGNOSIS — N200 Calculus of kidney: Secondary | ICD-10-CM

## 2021-05-24 LAB — COMPREHENSIVE METABOLIC PANEL WITH GFR
ALT: 57 U/L — ABNORMAL HIGH (ref 0–44)
AST: 28 U/L (ref 15–41)
Albumin: 4.3 g/dL (ref 3.5–5.0)
Alkaline Phosphatase: 71 U/L (ref 38–126)
Anion gap: 12 (ref 5–15)
BUN: 12 mg/dL (ref 6–20)
CO2: 22 mmol/L (ref 22–32)
Calcium: 9.4 mg/dL (ref 8.9–10.3)
Chloride: 103 mmol/L (ref 98–111)
Creatinine, Ser: 1.19 mg/dL (ref 0.61–1.24)
GFR, Estimated: >60 mL/min (ref >60)
Glucose, Bld: 258 mg/dL — ABNORMAL HIGH (ref 70–99)
Potassium: 4.2 mmol/L (ref 3.5–5.1)
Sodium: 137 mmol/L (ref 135–145)
Total Bilirubin: 1 mg/dL (ref 0.3–1.2)
Total Protein: 6.9 g/dL (ref 6.5–8.1)

## 2021-05-24 LAB — GLUCOSE, CAPILLARY
Glucose-Capillary: 160 mg/dL — ABNORMAL HIGH (ref 70–99)
Glucose-Capillary: 152 mg/dL — ABNORMAL HIGH (ref 70–99)

## 2021-05-24 LAB — URINALYSIS, ROUTINE W REFLEX MICROSCOPIC
Bilirubin Urine: NEGATIVE
Glucose, UA: >1000 mg/dL — AB
Ketones, ur: 40 mg/dL — AB
Leukocytes,Ua: NEGATIVE
Nitrite: NEGATIVE
Specific Gravity, Urine: 1.023 (ref 1.005–1.030)
pH: 5.5 (ref 5.0–8.0)

## 2021-05-24 LAB — CBC
HCT: 44.2 % (ref 39.0–52.0)
Hemoglobin: 15.4 g/dL (ref 13.0–17.0)
MCH: 29.8 pg (ref 26.0–34.0)
MCHC: 34.8 g/dL (ref 30.0–36.0)
MCV: 85.5 fL (ref 80.0–100.0)
Platelets: 204 K/uL (ref 150–400)
RBC: 5.17 MIL/uL (ref 4.22–5.81)
RDW: 12.4 % (ref 11.5–15.5)
WBC: 9.9 K/uL (ref 4.0–10.5)
nRBC: 0 % (ref 0.0–0.2)

## 2021-05-24 LAB — LIPASE, BLOOD: Lipase: 71 U/L — ABNORMAL HIGH (ref 11–51)

## 2021-05-24 MED ORDER — ONDANSETRON HCL 4 MG/2ML IJ SOLN
4.0000 mg | Freq: Four times a day (QID) | INTRAMUSCULAR | Status: DC | PRN
  Administered 2021-05-25: 4 mg via INTRAVENOUS
  Filled 2021-05-24: qty 2

## 2021-05-24 MED ORDER — FENTANYL CITRATE PF 50 MCG/ML IJ SOSY
50.0000 ug | PREFILLED_SYRINGE | INTRAMUSCULAR | Status: DC | PRN
  Administered 2021-05-24: 50 ug via INTRAVENOUS
  Filled 2021-05-24: qty 1

## 2021-05-24 MED ORDER — METFORMIN HCL ER 500 MG PO TB24
1000.0000 mg | ORAL_TABLET | Freq: Two times a day (BID) | ORAL | Status: DC
  Administered 2021-05-25: 1000 mg via ORAL
  Filled 2021-05-24: qty 2

## 2021-05-24 MED ORDER — MORPHINE SULFATE (PF) 4 MG/ML IV SOLN
4.0000 mg | Freq: Once | INTRAVENOUS | Status: AC
  Administered 2021-05-24: 4 mg via INTRAVENOUS
  Filled 2021-05-24: qty 1

## 2021-05-24 MED ORDER — GLIMEPIRIDE 1 MG PO TABS: 4.0000 mg | ORAL_TABLET | Freq: Every day | ORAL | Status: DC

## 2021-05-24 MED ORDER — HYDROMORPHONE HCL 1 MG/ML IJ SOLN: 1.0000 mg | INTRAMUSCULAR | Status: DC | PRN

## 2021-05-24 MED ORDER — KETOROLAC TROMETHAMINE 15 MG/ML IJ SOLN
15.0000 mg | Freq: Once | INTRAMUSCULAR | Status: AC
  Administered 2021-05-24: 15 mg via INTRAVENOUS
  Filled 2021-05-24: qty 1

## 2021-05-24 MED ORDER — OXYCODONE HCL 5 MG PO TABS
5.0000 mg | ORAL_TABLET | ORAL | Status: DC | PRN
  Administered 2021-05-24 – 2021-05-25 (×2): 5 mg via ORAL
  Filled 2021-05-24 (×2): qty 1

## 2021-05-24 MED ORDER — SODIUM CHLORIDE 0.9 % IV BOLUS
1000.0000 mL | Freq: Once | INTRAVENOUS | Status: AC
  Administered 2021-05-24: 1000 mL via INTRAVENOUS

## 2021-05-24 MED ORDER — ONDANSETRON HCL 4 MG PO TABS
4.0000 mg | ORAL_TABLET | Freq: Four times a day (QID) | ORAL | Status: DC | PRN
  Administered 2021-05-24: 4 mg via ORAL
  Filled 2021-05-24: qty 1

## 2021-05-24 MED ORDER — INSULIN ASPART 100 UNIT/ML IJ SOLN
0.0000 [IU] | Freq: Three times a day (TID) | INTRAMUSCULAR | Status: DC
  Administered 2021-05-24 – 2021-05-25 (×2): 3 [IU] via SUBCUTANEOUS
  Administered 2021-05-25: 2 [IU] via SUBCUTANEOUS
  Administered 2021-05-26: 5 [IU] via SUBCUTANEOUS

## 2021-05-24 MED ORDER — ACETAMINOPHEN 650 MG RE SUPP: 650.0000 mg | Freq: Four times a day (QID) | RECTAL | Status: DC | PRN

## 2021-05-24 MED ORDER — SODIUM CHLORIDE 0.45 % IV SOLN: INTRAVENOUS | Status: DC

## 2021-05-24 MED ORDER — TAMSULOSIN HCL 0.4 MG PO CAPS
0.4000 mg | ORAL_CAPSULE | Freq: Every day | ORAL | Status: DC
  Administered 2021-05-24 – 2021-05-25 (×2): 0.4 mg via ORAL
  Filled 2021-05-24 (×2): qty 1

## 2021-05-24 MED ORDER — MORPHINE SULFATE (PF) 4 MG/ML IV SOLN
6.0000 mg | Freq: Once | INTRAVENOUS | Status: AC
  Administered 2021-05-24: 6 mg via INTRAVENOUS
  Filled 2021-05-24: qty 2

## 2021-05-24 MED ORDER — NITROGLYCERIN 0.4 MG SL SUBL: 0.4000 mg | SUBLINGUAL_TABLET | SUBLINGUAL | Status: DC | PRN

## 2021-05-24 MED ORDER — HYDROMORPHONE HCL 1 MG/ML IJ SOLN
1.0000 mg | INTRAMUSCULAR | Status: DC | PRN
  Administered 2021-05-24 – 2021-05-25 (×3): 1 mg via INTRAVENOUS
  Filled 2021-05-24 (×3): qty 1

## 2021-05-24 MED ORDER — IOHEXOL 300 MG/ML  SOLN
100.0000 mL | Freq: Once | INTRAMUSCULAR | Status: AC | PRN
  Administered 2021-05-24: 85 mL via INTRAVENOUS

## 2021-05-24 MED ORDER — ACETAMINOPHEN 325 MG PO TABS
650.0000 mg | ORAL_TABLET | Freq: Four times a day (QID) | ORAL | Status: DC | PRN
  Administered 2021-05-26: 650 mg via ORAL
  Filled 2021-05-24: qty 2

## 2021-05-24 MED ORDER — ATORVASTATIN CALCIUM 20 MG PO TABS
20.0000 mg | ORAL_TABLET | Freq: Every day | ORAL | Status: DC
  Administered 2021-05-24 – 2021-05-25 (×2): 20 mg via ORAL
  Filled 2021-05-24 (×3): qty 1

## 2021-05-24 MED ORDER — ONDANSETRON HCL 4 MG/2ML IJ SOLN
4.0000 mg | Freq: Once | INTRAMUSCULAR | Status: AC
  Administered 2021-05-24: 4 mg via INTRAVENOUS
  Filled 2021-05-24: qty 2

## 2021-05-24 NOTE — ED Notes (Signed)
Called [facility] and advised patient has a bed ready at [facility].  Was advised that it would be a bit of a wait, that there was another patient ahead of him but they would send transport as soon as available. ?

## 2021-05-24 NOTE — ED Notes (Signed)
Patient requesting Pain Medication at this Time. MD informed. ?

## 2021-05-24 NOTE — Consult Note (Signed)
 Urology Consult Note   Requesting Attending Physician:  Celinda Alm Lot, MD Service Providing Consult: Urology  Consulting Attending: Donnice Brooks, MD   Reason for Consult:  Distal ureteral stone(s)  HPI: Jonathan Melendez is seen in consultation for reasons noted above at the request of Celinda Alm Lot, MD for evaluation of right UVJ stones.  This is a 51 y.o. male with Hx of ankylosing spondylitis, T2DM, HTN, and nephrolithiasis who presented to the ED today with right flank pain and N/V of 1 day's duration. CT scan demonstrated a small cluster of stones in the distal right ureter at the UVJ with associated hydronephrosis. He was admitted for persistent pain and nausea.   He is HDS and has had no fevers. UA non-infectious. No leukocytosis. He denies gross hematuria, dysuria.  He has a history of stones, but always passes them spontaneously. Never needed intervention. He has not seen a Insurance Underwriter in many years.    Past Medical History: Past Medical History:  Diagnosis Date   Ankylosing spondylitis (HCC)    Diabetes mellitus without complication (HCC)    Dyslipidemia    Hypertension     Past Surgical History:  Past Surgical History:  Procedure Laterality Date   TONSILLECTOMY     VASECTOMY     VASECTOMY REVERSAL      Medication: Current Facility-Administered Medications  Medication Dose Route Frequency Provider Last Rate Last Admin   0.45 % sodium chloride  infusion   Intravenous Continuous Celinda Alm Lot, MD       acetaminophen  (TYLENOL ) tablet 650 mg  650 mg Oral Q6H PRN Celinda Alm Lot, MD       Or   acetaminophen  (TYLENOL ) suppository 650 mg  650 mg Rectal Q6H PRN Celinda Alm Lot, MD       atorvastatin  (LIPITOR) tablet 20 mg  20 mg Oral Daily Celinda Alm Lot, MD       [START ON 05/25/2021] glimepiride  (AMARYL ) tablet 4 mg  4 mg Oral Daily Celinda Alm Lot, MD       HYDROmorphone  (DILAUDID ) injection 1 mg  1 mg Intravenous Q4H PRN Celinda Alm Lot, MD       insulin  aspart (novoLOG ) injection 0-15 Units  0-15 Units Subcutaneous TID WC Celinda Alm Lot, MD       ketorolac  (TORADOL ) 15 MG/ML injection 15 mg  15 mg Intravenous Once Ortiz, David Manuel, MD       [START ON 05/25/2021] metFORMIN  (GLUCOPHAGE -XR) 24 hr tablet 1,000 mg  1,000 mg Oral BID WC Celinda Alm Lot, MD       nitroGLYCERIN  (NITROSTAT ) SL tablet 0.4 mg  0.4 mg Sublingual Q5 min PRN Celinda Alm Lot, MD       ondansetron  (ZOFRAN ) tablet 4 mg  4 mg Oral Q6H PRN Celinda Alm Lot, MD       Or   ondansetron  (ZOFRAN ) injection 4 mg  4 mg Intravenous Q6H PRN Celinda Alm Lot, MD       oxyCODONE  (Oxy IR/ROXICODONE ) immediate release tablet 5 mg  5 mg Oral Q4H PRN Celinda Alm Lot, MD       tamsulosin  (FLOMAX ) capsule 0.4 mg  0.4 mg Oral QPC supper Brooks Donnice, MD        Allergies: No Known Allergies  Social History: Social History   Tobacco Use   Smoking status: Never    Passive exposure: Never   Smokeless tobacco: Never  Vaping Use   Vaping Use: Never used  Substance Use Topics  Alcohol use: Yes    Comment: occasional   Drug use: Never    Family History Family History  Problem Relation Age of Onset   Lung cancer Father    Dementia Father    Autoimmune disease Brother        x2   Lung cancer Paternal Grandfather    Dementia Paternal Grandfather     Review of Systems 10 systems were reviewed and are negative except as noted specifically in the HPI.  Objective   Vital signs in last 24 hours: BP (!) 146/92 (BP Location: Right Arm)   Pulse 71   Temp 98.3 F (36.8 C) (Oral)   Resp 18   Ht 5' 8 (1.727 m)   Wt 84 kg   SpO2 93%   BMI 28.16 kg/m   Physical Exam General: NAD, A&O, resting, appropriate HEENT: [place]/AT, EOMI, MMM Pulmonary: Normal work of breathing Cardiovascular: HDS, adequate peripheral perfusion Abdomen: Soft, NTTP, nondistended. GU: right CVA tenderness Extremities: warm and well perfused Neuro:  Appropriate, no focal neurological deficits  Most Recent Labs: Lab Results  Component Value Date   WBC 9.9 05/24/2021   HGB 15.4 05/24/2021   HCT 44.2 05/24/2021   PLT 204 05/24/2021    Lab Results  Component Value Date   NA 137 05/24/2021   K 4.2 05/24/2021   CL 103 05/24/2021   CO2 22 05/24/2021   BUN 12 05/24/2021   CREATININE 1.19 05/24/2021   CALCIUM  9.4 05/24/2021    No results found for: INR, APTT   IMAGING: CT ABDOMEN PELVIS W CONTRAST  Result Date: 05/24/2021 CLINICAL DATA:  Abdominal pain, acute, nonlocalized abd pain EXAM: CT ABDOMEN AND PELVIS WITH CONTRAST TECHNIQUE: Multidetector CT imaging of the abdomen and pelvis was performed using the standard protocol following bolus administration of intravenous contrast. RADIATION DOSE REDUCTION: This exam was performed according to the departmental dose-optimization program which includes automated exposure control, adjustment of the mA and/or kV according to patient size and/or use of iterative reconstruction technique. CONTRAST:  85mL OMNIPAQUE  IOHEXOL  300 MG/ML  SOLN COMPARISON:  CT abdomen pelvis 06/01/2020, CT 08/04/2019 FINDINGS: Lower chest: There is a 5 mm right lower lobe pulmonary nodule (series 2, image 14), which is stable dating back to June 2021. Hepatobiliary: Slight decreased liver attenuation favored to be due to contrast timing. No focal liver lesion. The gallbladder is unremarkable. Pancreas: Unremarkable. No pancreatic ductal dilatation or surrounding inflammatory changes. Spleen: Normal in size without focal abnormality. Adrenals/Urinary Tract: Adrenal glands are unremarkable. There is right-sided hydroureteronephrosis with perinephric and periureteral stranding due to a cluster of obstructing small stones at the right ureterovesicular junction (series 2, image 77). There is adjacent fluid tracking in the retroperitoneum along the retroperitoneal portions of the duodenum, and inferiorly with a significant portion  of this fluid tracking along the distal right ureter, this fluid is all likely reactive. There is an additional punctate nonobstructive stone in the right mid kidney. No left-sided hydronephrosis or nephrolithiasis. Stomach/Bowel: The stomach is within normal limits. There is no evidence of bowel obstruction.There is no bowel wall thickening. The appendix is normal. Vascular/Lymphatic: No significant vascular findings are present. No enlarged abdominal or pelvic lymph nodes. Reproductive: Unremarkable. Other: No hernia. No free air. Retroperitoneal fluid as described above. Musculoskeletal: No acute osseous abnormality. No suspicious lytic or blastic lesions. Multilevel degenerative changes spine worst at L4-L5 and L5-S1. Mild bilateral hip arthritis. IMPRESSION: Right-sided hydroureteronephrosis with perinephric and periureteral stranding due to a cluster of small obstructing  stones at the right ureterovesicular junction. There is fluid in the right retroperitoneum tracking from the upper mid abdomen into the pelvis, likely reactive and related to the right nephroureteral process. Correlate with urinalysis for signs of infection. Additional punctate nonobstructive stone in the right mid kidney. 5 mm right lower lobe pulmonary nodule as seen on recent chest CT. Recommend continued chest CT follow-up as recommended on prior chest CT in September 2022. Electronically Signed   By: Jacob  Kahn M.D.   On: 05/24/2021 09:48    ------  Assessment:  51 y.o. male with Hx of nephrolithiasis who presents with right UVJ stones and pain/N/V. He is not infected therefore needs no emergent intervention. His stones appear to be close to passing, so hopefully with good pain and nausea control he can be discharged on MET. If unable to control his symptoms then we will likely proceed with intervention.   Recommendations: - No acute urologic intervention needed - Please make NPO at MN - Continue Flomax  0.4 mg daily - Strain  all urine - If pain and nausea can be controlled then cleared for discharge and will follow up with Urology as outpatient  - If pain cannot be controlled, then we will potentially post for ureteroscopic stone extraction as OR availability allows   Thank you for this consult. Please contact the urology consult pager with any further questions/concerns.  Elsie Chang, MD [facility] Urology Resident, [facility]

## 2021-05-24 NOTE — H&P (Signed)
?History and Physical  ? ? ?Patient: Jonathan Melendez IBB:[id number] DOB: 01-22-1971 ?DOA: 05/24/2021 ?DOS: the patient was seen and examined on 05/24/2021 ?PCP: Lazoff, Shawn P, DO  ?Patient coming from: Home ? ?Chief Complaint:  ?Chief Complaint  ?Patient presents with  ? Abdominal Pain  ? ?HPI: Jonathan Melendez is a 51 y.o. male with medical history significant of ankylosing spondylitis, type II DM, hyperlipidemia, hypertension, overweight, urolithiasis who presented to the emergency department with complaints of right flank pain that woke him up around 0400 associated with diaphoresis, multiple episodes of emesis and dry heaving.  His stools were mildly loose.  Denying melena, hematochezia or constipation.  He denied fever, chills, rhinorrhea, sore throat, productive cough, wheezing or hemoptysis.  He felt dyspneic when having pain.  No chest pain, dizziness, palpitations, PND, orthopnea or pitting edema of the lower extremities.  No polyuria, polydipsia, polyphagia or blurred vision. ? ?ED course: Initial vital signs were temperature 98.4 ?F, pulse 41, respirations 16, BP 157/86 mmHg O2 sat 100% on room air.  The patient received morphine 6 mg IVP, morphine 4 mg IVP, ketorolac 15 mg IVP, ondansetron 4 mg IVP and 1000 mL of sodium chloride bolus. ? ?Lab work: UDS showed glucosuria more than 1000 mg/dL, moderate hemoglobinuria, ketonuria 40 mg/dL and trace proteinuria.  Microscopic examination was unremarkable.  His CBC was normal.  Lipase is elevated at 71 units/L.  CMP showed a glucose of 258 mg/dL and ALT of 57 units/L.  The rest of the CMP measurements were unremarkable. ? ?Imaging: CT abdomen/pelvis with contrast showed right-sided hydroureteronephrosis with perinephric and periureteral stranding due to a cluster of small obstructing stones at the right UVJ.  There was a 5 mm right lower lobe pulmonary nodule that has been seen on recent chest CT.  Follow-up recommended initially for in 6 to 12 months. ?   ?Review of Systems: As mentioned in the history of present illness. All other systems reviewed and are negative. ?Past Medical History:  ?Diagnosis Date  ? Ankylosing spondylitis ([place])   ? Diabetes mellitus without complication ([place])   ? Dyslipidemia   ? Hypertension   ? ?Past Surgical History:  ?Procedure Laterality Date  ? TONSILLECTOMY    ? VASECTOMY    ? VASECTOMY REVERSAL    ? ?Social History:  reports that he has never smoked. He has never been exposed to tobacco smoke. He has never used smokeless tobacco. He reports current alcohol use. He reports that he does not use drugs. ? ?No Known Allergies ? ?Family History  ?Problem Relation Age of Onset  ? Lung cancer Father   ? Dementia Father   ? Autoimmune disease Brother   ?     x2  ? Lung cancer Paternal Grandfather   ? Dementia Paternal Grandfather   ? ? ?Prior to Admission medications   ?Medication Sig Start Date End Date Taking? Authorizing Provider  ?acetaminophen (TYLENOL) 500 MG tablet Take 1-2 tablets (500-1,000 mg total) by mouth every 8 (eight) hours as needed for mild pain. 06/03/20  Yes Mariel Aloe, MD  ?atorvastatin (LIPITOR) 20 MG tablet Take 20 mg by mouth daily. 05/08/21  Yes [provider]  ?glimepiride (AMARYL) 2 MG tablet Take 4 mg by mouth daily. 05/30/19  Yes [provider]  ?metFORMIN (GLUCOPHAGE-XR) 500 MG 24 hr tablet Take 1,000 mg by mouth in the morning and at bedtime.   Yes [provider]  ?metoprolol succinate (TOPROL-XL) 50 MG 24 hr tablet Take 50 mg by  mouth daily.   Yes [provider]  ?Multiple Vitamin (MULTIVITAMIN ADULT PO) Take 1 tablet by mouth daily.   Yes [provider]  ?nitroGLYCERIN (NITROSTAT) 0.4 MG SL tablet Place 1 tablet (0.4 mg total) under the tongue every 5 (five) minutes as needed for chest pain. Max 3 doses in 15 minutes 07/24/19 05/24/21 Yes Lorretta Harp, MD  ?traMADol (ULTRAM) 50 MG tablet Take 50 mg by mouth every 8 (eight) hours as needed. 05/22/21  Yes  [provider]  ?valsartan (DIOVAN) 80 MG tablet Take 80 mg by mouth daily. ?Patient not taking: Reported on 05/24/2021 04/04/21   [provider]  ? ? ?Physical Exam: ?Vitals:  ? 05/24/21 1020 05/24/21 1100 05/24/21 1130 05/24/21 1300  ?BP: (!) 154/93 (!) 161/97 (!) 164/97 (!) 153/95  ?Pulse: (!) 51 (!) 46 (!) 50 60  ?Resp: '17 17 19 15  '$ ?Temp:    97.9 ?F (36.6 ?C)  ?TempSrc:    Oral  ?SpO2: 95% 95% 95% 95%  ?Weight:      ?Height:      ? ?Physical Exam ?Vitals and nursing note reviewed.  ?Constitutional:   ?   Appearance: He is obese.  ?HENT:  ?   Head: Normocephalic and atraumatic.  ?   Mouth/Throat:  ?   Mouth: Mucous membranes are moist.  ?Eyes:  ?   Pupils: Pupils are equal, round, and reactive to light.  ?Neck:  ?   Vascular: No JVD.  ?Cardiovascular:  ?   Rate and Rhythm: Normal rate and regular rhythm.  ?   Heart sounds: S1 normal and S2 normal.  ?Pulmonary:  ?   Effort: Pulmonary effort is normal.  ?   Breath sounds: Normal breath sounds.  ?Abdominal:  ?   General: Bowel sounds are normal. There is no distension.  ?   Palpations: Abdomen is soft.  ?   Tenderness: There is abdominal tenderness in the right lower quadrant. There is right CVA tenderness. There is no guarding or rebound.  ?Musculoskeletal:  ?   Cervical back: Neck supple.  ?   Right lower leg: No edema.  ?   Left lower leg: No edema.  ?Skin: ?   General: Skin is warm and dry.  ?   Coloration: Skin is not jaundiced.  ?Neurological:  ?   General: No focal deficit present.  ?   Mental Status: He is alert and oriented to person, place, and time.  ?Psychiatric:     ?   Mood and Affect: Mood normal.     ?   Behavior: Behavior normal.  ? ?Data Reviewed: ? ?There are no new results to review at this time. ? ?Assessment and Plan: ?Principal Problem: ?  Hydronephrosis of right kidney ?In the setting of ?  Urolithiasis ?Observation/telemetry. ?Continue IV fluids. ?Analgesics as needed. ?Antiemetics as needed. ?Strain all urine. ?N.p.o.  after midnight. ?Good chance of spontaneously passing per urology. ? ?Active Problems: ?  Type 2 diabetes mellitus with hyperglycemia ([place]) ?Continue IV fluids. ?Carbohydrate modified diet. ?CBG monitoring with RI SS. ?Check hemoglobin A1c. ? ?  Pulmonary nodule ?Interval imaging surveillance. ?Follow-up with PCP. ? ?  Sinus bradycardia ?Hold metoprolol. ?Continue cardio monitoring. ? ?  Essential hypertension ?Hold metoprolol due to bradycardia. ?Monitor BP and heart rate. ? ?  Hyperlipidemia ?Continue atorvastatin 20 mg p.o. daily. ? ?  Ankylosing spondylitis ([place]) ?No signs of active disease. ?Analgesics as needed. ? ?  Overweight (BMI 25.0-29.9) ?Lifestyle modifications. ?Follow-up with  primary. ? ? ? ? Advance Care Planning:   Code Status: Full code.  ? ?Consults: Paged urology Festus Aloe, MD). ? ?Family Communication:  ? ?Severity of Illness: ?The appropriate patient status for this patient is OBSERVATION. Observation status is judged to be reasonable and necessary in order to provide the required intensity of service to ensure the patient's safety. The patient's presenting symptoms, physical exam findings, and initial radiographic and laboratory data in the context of their medical condition is felt to place them at decreased risk for further clinical deterioration. Furthermore, it is anticipated that the patient will be medically stable for discharge from the hospital within 2 midnights of admission.  ? ?Author: ?Reubin Milan, MD ?05/24/2021 1:43 PM ? ?For on call review [email or web address].  ? ?This document was prepared using Dragon voice recognition software and may contain some unintended transcription errors. ?

## 2021-05-24 NOTE — ED Notes (Signed)
[facility] at the Bedside. 

## 2021-05-24 NOTE — ED Notes (Signed)
MD at the Bedside. 

## 2021-05-24 NOTE — ED Notes (Signed)
Patient transported to CT 

## 2021-05-24 NOTE — ED Notes (Signed)
Care Handoff/Report given to [employer], Therapist, sports at [employer]. All Questions answered at this Time. ?

## 2021-05-24 NOTE — ED Notes (Signed)
Care Handoff/Patient Report given to [facility] at this Time. All Questions answered. ?

## 2021-05-24 NOTE — Progress Notes (Signed)
Plan of Care Note for accepted transfer ? ? ?Patient: Jonathan Melendez MRN: [id number]   DOA: 05/24/2021 ? ?Facility requesting transfer: [facility]. ?Requesting Provider: Elnora Morrison, MD ?Reason for transfer: Right kidney hydronephrosis. ?Facility course:  ?51 year old male with a past medical history hyperlipidemia, type II DM, hypertension who presented to [facility] emergency department due to abdominal pain from his right-sided back radiated to his umbilicus associated with nausea, emesis and an episode of diarrhea in the morning.   The patient was given 1000 mL of NS bolus, morphine 6 mg IVP, morphine 4 mg IVP, ondansetron 4 mg IVP and 50 mg of Toradol IVP.  He has been bradycardic in the 40s-50s, but uses metoprolol for hypertension. ? ?Labwork: ?Lipase, blood [[id number]] (Abnormal)   ?Collected: 05/24/21 0740   ?Updated: 05/24/21 7591   ?Specimen Type: Blood   ?Specimen Source: Vein   ? Lipase 71 High  U/L  ?Comprehensive metabolic panel [[id number]] (Abnormal)   ?Collected: 05/24/21 0740   ?Updated: 05/24/21 3570   ?Specimen Type: Blood   ?Specimen Source: Vein   ? Sodium 137 mmol/L  ? Potassium 4.2 mmol/L  ? Chloride 103 mmol/L  ? CO2 22 mmol/L  ? Glucose, Bld 258 High  mg/dL  ? BUN 12 mg/dL  ? Creatinine, Ser 1.19 mg/dL  ? Calcium 9.4 mg/dL  ? Total Protein 6.9 g/dL  ? Albumin 4.3 g/dL  ? AST 28 U/L  ? ALT 57 High  U/L  ? Alkaline Phosphatase 71 U/L  ? Total Bilirubin 1.0 mg/dL  ? GFR, Estimated >60 mL/min  ? Anion gap 12  ?Urinalysis, Routine w reflex microscopic Urine, Clean Catch [[id number]] (Abnormal)   ?Collected: 05/24/21 0740   ?Updated: 05/24/21 0923   ?Specimen Source: Urine, Clean Catch   ? Color, Urine YELLOW  ? APPearance CLEAR  ? Specific Gravity, Urine 1.023  ? pH 5.5  ? Glucose, UA >1,000 Abnormal  mg/dL  ? Hgb urine dipstick MODERATE Abnormal   ? Bilirubin Urine NEGATIVE  ? Ketones, ur 40 Abnormal  mg/dL  ? Protein, ur TRACE Abnormal  mg/dL  ? Nitrite NEGATIVE  ? Leukocytes,Ua NEGATIVE  ? RBC / HPF 0-5  RBC/hpf  ? WBC, UA 0-5 WBC/hpf  ? Squamous Epithelial / LPF 0-5  ? Mucus PRESENT  ?CBC [[id number]]   ?Collected: 05/24/21 0740   ?Updated: 05/24/21 3354   ?Specimen Type: Blood   ?Specimen Source: Vein   ? WBC 9.9 K/uL  ? RBC 5.17 MIL/uL  ? Hemoglobin 15.4 g/dL  ? HCT 44.2 %  ? MCV 85.5 fL  ? MCH 29.8 pg  ? MCHC 34.8 g/dL  ? RDW 12.4 %  ? Platelets 204 K/uL  ? nRBC 0.0 %  ? ?Imaging:  ?CT abdomen/pelvis with contrast ?IMPRESSION: ?Right-sided hydroureteronephrosis with perinephric and periureteral stranding due to cluster of small obstructing stones at the right ureterovesicular junction. There is fluid in the right retroperitoneum tracking from the upper mid abdomen into the pelvis, likely reactive and related to the right nephroureteral process. Correlate with urinalysis for signs of infection. Additional punctate nonobstructive stone in the right mid kidney. 5 mm right lower lobe pulmonary nodule as seen on recent chest CT. Recommend continued chest CT follow-up as recommended on prior chest CT in September 2022. ? ?Plan of care: ?The patient is accepted for admission to Telemetry unit, at [facility]. ? ?Author: ?Reubin Milan, MD ?05/24/2021 ? ?Check [email or web address] for on-call coverage. ? ?Nursing staff, Please call [place]  number on Amion as soon as patient's arrival, so appropriate admitting provider can evaluate the pt. ?

## 2021-05-24 NOTE — ED Provider Notes (Addendum)
[facility] EMERGENCY DEPT Provider Note   CSN: [id number] Arrival date & time: 05/24/21  7829     History  Chief Complaint  Patient presents with   Abdominal Pain    Jonathan Melendez is a 51 y.o. male.  Patient presents with severe abdominal pain that radiates to the back.  Patient unable to get comfortable since early this morning.  Patient had a few episodes of nonbilious vomiting and nonbloody diarrhea.  Patient had pancreatitis in the past this feels more severe but does have similar location.  No recent alcohol use.  No cigarette smoking.  No chest pain or shortness of breath.  No fevers or chills.  Pain fairly constant.  No injuries recently or abd surgery hx.      Home Medications Prior to Admission medications   Medication Sig Start Date End Date Taking? Authorizing Provider  atorvastatin (LIPITOR) 20 MG tablet Take 20 mg by mouth daily. 05/08/21  Yes [provider]  acetaminophen (TYLENOL) 500 MG tablet Take 1-2 tablets (500-1,000 mg total) by mouth every 8 (eight) hours as needed for mild pain. 06/03/20   Narda Bonds, MD  glimepiride (AMARYL) 2 MG tablet Take 4 mg by mouth daily. 05/30/19   [provider]  HYDROcodone-acetaminophen (NORCO/VICODIN) 5-325 MG tablet Take 1 tablet by mouth every 8 (eight) hours as needed for pain. 05/31/20   [provider]  metFORMIN (GLUCOPHAGE-XR) 500 MG 24 hr tablet Take 1,000 mg by mouth in the morning and at bedtime.    [provider]  metoprolol succinate (TOPROL-XL) 50 MG 24 hr tablet Take 50 mg by mouth daily.    [provider]  Multiple Vitamin (MULTIVITAMIN ADULT PO) Take 1 tablet by mouth daily.    [provider]  nitroGLYCERIN (NITROSTAT) 0.4 MG SL tablet Place 1 tablet (0.4 mg total) under the tongue every 5 (five) minutes as needed for chest pain. Max 3 doses in 15 minutes 07/24/19 10/22/19  Runell Gess, MD  traMADol (ULTRAM) 50 MG tablet Take 50 mg by  mouth every 8 (eight) hours as needed. 05/22/21   [provider]  valsartan (DIOVAN) 80 MG tablet Take 80 mg by mouth daily. 04/04/21   [provider]      Allergies    Patient has no known allergies.    Review of Systems   Review of Systems  Constitutional:  Negative for chills and fever.  HENT:  Negative for congestion.   Eyes:  Negative for visual disturbance.  Respiratory:  Negative for shortness of breath.   Cardiovascular:  Negative for chest pain.  Gastrointestinal:  Positive for abdominal pain and vomiting.  Genitourinary:  Negative for dysuria and flank pain.  Musculoskeletal:  Positive for back pain. Negative for neck pain and neck stiffness.  Skin:  Negative for rash.  Neurological:  Negative for light-headedness and headaches.   Physical Exam Updated Vital Signs BP (!) 164/97 (BP Location: Right Arm)   Pulse (!) 50   Temp 98.4 F (36.9 C) (Oral)   Resp 19   Ht 5\' 8"  (1.727 m)   Wt 84 kg   SpO2 95%   BMI 28.16 kg/m  Physical Exam Vitals and nursing note reviewed.  Constitutional:      General: He is not in acute distress.    Appearance: He is well-developed.  HENT:     Head: Normocephalic and atraumatic.     Mouth/Throat:     Mouth: Mucous membranes are moist.  Eyes:  General:        Right eye: No discharge.        Left eye: No discharge.     Conjunctiva/sclera: Conjunctivae normal.  Neck:     Trachea: No tracheal deviation.  Cardiovascular:     Rate and Rhythm: Bradycardia present.  Pulmonary:     Effort: Pulmonary effort is normal.  Abdominal:     General: There is no distension.     Palpations: Abdomen is soft.     Tenderness: There is abdominal tenderness in the epigastric area and periumbilical area. There is no guarding.  Musculoskeletal:     Cervical back: Normal range of motion and neck supple. No rigidity.     Comments: Mild back tenderness left upper lumbar paraspinal, no midline pain significant  Skin:    General:  Skin is warm.     Capillary Refill: Capillary refill takes less than 2 seconds.     Findings: No rash.  Neurological:     General: No focal deficit present.     Mental Status: He is alert.     Cranial Nerves: No cranial nerve deficit.  Psychiatric:        Mood and Affect: Mood normal.    ED Results / Procedures / Treatments   Labs (all labs ordered are listed, but only abnormal results are displayed) Labs Reviewed  LIPASE, BLOOD - Abnormal; Notable for the following components:      Result Value   Lipase 71 (*)    All other components within normal limits  COMPREHENSIVE METABOLIC PANEL - Abnormal; Notable for the following components:   Glucose, Bld 258 (*)    ALT 57 (*)    All other components within normal limits  URINALYSIS, ROUTINE W REFLEX MICROSCOPIC - Abnormal; Notable for the following components:   Glucose, UA >1,000 (*)    Hgb urine dipstick MODERATE (*)    Ketones, ur 40 (*)    Protein, ur TRACE (*)    All other components within normal limits  CBC    EKG EKG Interpretation  Date/Time:  Wednesday May 24 2021 07:35:12 EDT Ventricular Rate:  42 PR Interval:  123 QRS Duration: 106 QT Interval:  483 QTC Calculation: 404 R Axis:   66 Text Interpretation: Sinus bradycardia Confirmed by Blane Ohara [phone]) on 05/24/2021 7:46:14 AM  Radiology CT ABDOMEN PELVIS W CONTRAST  Result Date: 05/24/2021 CLINICAL DATA:  Abdominal pain, acute, nonlocalized abd pain EXAM: CT ABDOMEN AND PELVIS WITH CONTRAST TECHNIQUE: Multidetector CT imaging of the abdomen and pelvis was performed using the standard protocol following bolus administration of intravenous contrast. RADIATION DOSE REDUCTION: This exam was performed according to the departmental dose-optimization program which includes automated exposure control, adjustment of the mA and/or kV according to patient size and/or use of iterative reconstruction technique. CONTRAST:  85mL OMNIPAQUE IOHEXOL 300 MG/ML  SOLN COMPARISON:   CT abdomen pelvis 06/01/2020, CT 08/04/2019 FINDINGS: Lower chest: There is a 5 mm right lower lobe pulmonary nodule (series 2, image 14), which is stable dating back to June 2021. Hepatobiliary: Slight decreased liver attenuation favored to be due to contrast timing. No focal liver lesion. The gallbladder is unremarkable. Pancreas: Unremarkable. No pancreatic ductal dilatation or surrounding inflammatory changes. Spleen: Normal in size without focal abnormality. Adrenals/Urinary Tract: Adrenal glands are unremarkable. There is right-sided hydroureteronephrosis with perinephric and periureteral stranding due to a cluster of obstructing small stones at the right ureterovesicular junction (series 2, image 77). There is adjacent fluid tracking in the  retroperitoneum along the retroperitoneal portions of the duodenum, and inferiorly with a significant portion of this fluid tracking along the distal right ureter, this fluid is all likely reactive. There is an additional punctate nonobstructive stone in the right mid kidney. No left-sided hydronephrosis or nephrolithiasis. Stomach/Bowel: The stomach is within normal limits. There is no evidence of bowel obstruction.There is no bowel wall thickening. The appendix is normal. Vascular/Lymphatic: No significant vascular findings are present. No enlarged abdominal or pelvic lymph nodes. Reproductive: Unremarkable. Other: No hernia. No free air. Retroperitoneal fluid as described above. Musculoskeletal: No acute osseous abnormality. No suspicious lytic or blastic lesions. Multilevel degenerative changes spine worst at L4-L5 and L5-S1. Mild bilateral hip arthritis. IMPRESSION: Right-sided hydroureteronephrosis with perinephric and periureteral stranding due to a cluster of small obstructing stones at the right ureterovesicular junction. There is fluid in the right retroperitoneum tracking from the upper mid abdomen into the pelvis, likely reactive and related to the right  nephroureteral process. Correlate with urinalysis for signs of infection. Additional punctate nonobstructive stone in the right mid kidney. 5 mm right lower lobe pulmonary nodule as seen on recent chest CT. Recommend continued chest CT follow-up as recommended on prior chest CT in September 2022. Electronically Signed   By: Caprice Renshaw M.D.   On: 05/24/2021 09:48    Procedures Procedures    Medications Ordered in ED Medications  fentaNYL (SUBLIMAZE) injection 50 mcg (50 mcg Intravenous Given 05/24/21 0913)  ketorolac (TORADOL) 15 MG/ML injection 15 mg (has no administration in time range)  ondansetron (ZOFRAN) injection 4 mg (has no administration in time range)  sodium chloride 0.9 % bolus 1,000 mL (0 mLs Intravenous Stopped 05/24/21 1123)  morphine (PF) 4 MG/ML injection 6 mg (6 mg Intravenous Given 05/24/21 0855)  iohexol (OMNIPAQUE) 300 MG/ML solution 100 mL (85 mLs Intravenous Contrast Given 05/24/21 0917)  morphine (PF) 4 MG/ML injection 4 mg (4 mg Intravenous Given 05/24/21 1142)    ED Course/ Medical Decision Making/ A&P                           Medical Decision Making Amount and/or Complexity of Data Reviewed Labs: ordered. Radiology: ordered.  Risk Prescription drug management. Decision regarding hospitalization.   Patient presents with severe abdominal pain radiating to the back differential including pancreatitis, renal colic, ulcer, bowel related, musculoskeletal, vascular, other.  With history of pancreatitis plan for lipase and with pain more severe than normal plan for CT scan for further delineation.  General blood work sent as well.  Pain meds ordered and IV fluids due to vomiting and clinical concern for dehydration.  Urinalysis reviewed showing significant glucose and ketones.  Patient has diabetes history.  Patient's blood work reviewed showing mild hyperglycemia 258, lipase minimally elevated 71 similar to previous not 2 or 3 times normal, white blood cell count and  hemoglobin normal.  CT scan results independently reviewed showing multiple obstructing kidney stones at the UVJ on the right, pulmonary nodule noted from previous will need continued outpatient follow-up with CTs.  Patient required multiple pain meds and to improve his pain.  IV fluid bolus given.  Patient had EKG performed due to bradycardia, asymptomatic from that standpoint, sinus, patient heart rate 50 on recheck. Patient's pain and nausea still not controlled despite multiple medications.  Patient received IV fluid bolus.  Paged urology.  Discussed with hospitalist Dr. Robb Matar accepted for further care.        Final Clinical  Impression(s) / ED Diagnoses Final diagnoses:  Acute abdominal pain  Diabetes mellitus due to underlying condition with hyperglycemia, unspecified whether long term insulin use (HCC)  Right kidney stone  Right lower lobe pulmonary nodule  Vomiting in adult    Rx / DC Orders ED Discharge Orders     None         Blane Ohara, MD 05/24/21 1202    Blane Ohara, MD 05/24/21 [phone]

## 2021-05-24 NOTE — Discharge Instructions (Addendum)
1) Remove the stent by pulling the string on Monday morning, May 29, 2021 as instructed ? ?2) Have your primary doctor order CT scan of your chest likely in 6 months time.  ? ?[facility] ?[phone] ?Post Ureteroscopy With or Without Stent Instructions ? ?Definitions: ? ?Ureter: The duct that transports urine from the kidney to the bladder. ?Stent:   A plastic hollow tube that is placed into the ureter, from the kidney to the bladder to prevent the ureter from swelling shut. ? ?GENERAL INSTRUCTIONS: ? ?Despite the fact that no skin incisions were used, the area around the ureter and bladder is raw and irritated. The stent is a foreign body which will further irritate the bladder wall. This irritation is manifested by increased frequency of urination, both day and night, and by an increase in the urge to urinate. In some, the urge to urinate is present almost always. Sometimes the urge is strong enough that you may not be able to stop yourself from urinating. The only real cure is to remove the stent and then give time for the bladder wall to heal which can't be done until the danger of the ureter swelling shut has passed, which varies. ? ?You may see some blood in your urine while the stent is in place and a few days afterwards. Do not be alarmed, even if the urine was clear for a while. Get off your feet and drink lots of fluids until clearing occurs. If you start to pass clots or don't improve, call us. ? ?DIET: ?You may return to your normal diet immediately. Because of the raw surface of your bladder, alcohol, spicy foods, acid type foods and drinks with caffeine may cause irritation or frequency and should be used in moderation. To keep your urine flowing freely and to avoid constipation, drink plenty of fluids during the day ( 8-10 glasses ). ?Tip: Avoid cranberry juice because it is very acidic. ? ?ACTIVITY: ?Your physical activity doesn't need to be restricted. However, if you are very  active, you may see some blood in your urine. We suggest that you reduce your activity under these circumstances until the bleeding has stopped. ? ?BOWELS: ?It is important to keep your bowels regular during the postoperative period. Straining with bowel movements can cause bleeding. A bowel movement every other day is reasonable. Use a mild laxative if needed, such as Milk of Magnesia 2-3 tablespoons, or 2 Dulcolax tablets. Call if you continue to have problems. If you have been taking narcotics for pain, before, during or after your surgery, you may be constipated. Take a laxative if necessary. ? ? ?MEDICATION: ?You should resume your pre-surgery medications unless told not to. In addition you will often be given an antibiotic to prevent infection and likely several as needed medications for stent related discomfort. These should be taken as prescribed until the bottles are finished unless you are having an unusual reaction to one of the drugs. ? ?PROBLEMS YOU SHOULD REPORT TO [place]: ?Fevers over 100.5 Fahrenheit. ?Heavy bleeding, or clots ( See above notes about blood in urine ). ?Inability to urinate. ?Drug reactions ( hives, rash, nausea, vomiting, diarrhea ). ?Severe burning or pain with urination that is not improving. ? ? ? ? ? ?

## 2021-05-24 NOTE — ED Triage Notes (Signed)
Patient reports to the ER for abdominal pain that goes from back to belly button. Patient reports he cannot get comfortable and he has had emesis. Patient reports he had a BM this morning that was diarrhea in nature.  ?

## 2021-05-25 ENCOUNTER — Inpatient Hospital Stay (HOSPITAL_COMMUNITY): Payer: BC Managed Care – PPO | Admitting: Anesthesiology

## 2021-05-25 ENCOUNTER — Other Ambulatory Visit: Payer: Self-pay | Admitting: Urology

## 2021-05-25 ENCOUNTER — Encounter (HOSPITAL_COMMUNITY): Payer: Self-pay | Admitting: Internal Medicine

## 2021-05-25 ENCOUNTER — Encounter (HOSPITAL_COMMUNITY)
Admission: EM | Disposition: A | Discharge status: Home / Self Care | Payer: Self-pay | Source: Home / Self Care | Attending: Internal Medicine

## 2021-05-25 ENCOUNTER — Inpatient Hospital Stay (HOSPITAL_COMMUNITY): Payer: BC Managed Care – PPO

## 2021-05-25 DIAGNOSIS — R001 Bradycardia, unspecified: Secondary | ICD-10-CM | POA: Diagnosis present

## 2021-05-25 DIAGNOSIS — Z79899 Other long term (current) drug therapy: Secondary | ICD-10-CM | POA: Diagnosis not present

## 2021-05-25 DIAGNOSIS — Z87442 Personal history of urinary calculi: Secondary | ICD-10-CM | POA: Diagnosis not present

## 2021-05-25 DIAGNOSIS — E785 Hyperlipidemia, unspecified: Secondary | ICD-10-CM | POA: Diagnosis present

## 2021-05-25 DIAGNOSIS — E1165 Type 2 diabetes mellitus with hyperglycemia: Secondary | ICD-10-CM | POA: Diagnosis present

## 2021-05-25 DIAGNOSIS — Z6828 Body mass index (BMI) 28.0-28.9, adult: Secondary | ICD-10-CM | POA: Diagnosis not present

## 2021-05-25 DIAGNOSIS — M459 Ankylosing spondylitis of unspecified sites in spine: Secondary | ICD-10-CM | POA: Diagnosis present

## 2021-05-25 DIAGNOSIS — E669 Obesity, unspecified: Secondary | ICD-10-CM | POA: Diagnosis present

## 2021-05-25 DIAGNOSIS — R911 Solitary pulmonary nodule: Secondary | ICD-10-CM | POA: Diagnosis present

## 2021-05-25 DIAGNOSIS — N132 Hydronephrosis with renal and ureteral calculous obstruction: Secondary | ICD-10-CM | POA: Diagnosis present

## 2021-05-25 DIAGNOSIS — Z801 Family history of malignant neoplasm of trachea, bronchus and lung: Secondary | ICD-10-CM | POA: Diagnosis not present

## 2021-05-25 DIAGNOSIS — Z7984 Long term (current) use of oral hypoglycemic drugs: Secondary | ICD-10-CM | POA: Diagnosis not present

## 2021-05-25 DIAGNOSIS — N200 Calculus of kidney: Secondary | ICD-10-CM

## 2021-05-25 DIAGNOSIS — R81 Glycosuria: Secondary | ICD-10-CM | POA: Diagnosis present

## 2021-05-25 DIAGNOSIS — R824 Acetonuria: Secondary | ICD-10-CM | POA: Diagnosis present

## 2021-05-25 DIAGNOSIS — N1330 Unspecified hydronephrosis: Secondary | ICD-10-CM | POA: Diagnosis not present

## 2021-05-25 DIAGNOSIS — R109 Unspecified abdominal pain: Secondary | ICD-10-CM | POA: Diagnosis present

## 2021-05-25 DIAGNOSIS — R823 Hemoglobinuria: Secondary | ICD-10-CM | POA: Diagnosis present

## 2021-05-25 DIAGNOSIS — R7989 Other specified abnormal findings of blood chemistry: Secondary | ICD-10-CM | POA: Diagnosis present

## 2021-05-25 DIAGNOSIS — I10 Essential (primary) hypertension: Secondary | ICD-10-CM | POA: Diagnosis present

## 2021-05-25 HISTORY — PX: CYSTOSCOPY/URETEROSCOPY/HOLMIUM LASER/STENT PLACEMENT: SHX6546

## 2021-05-25 LAB — CBC
HCT: 40.7 % (ref 39.0–52.0)
Hemoglobin: 14.3 g/dL (ref 13.0–17.0)
MCH: 30.8 pg (ref 26.0–34.0)
MCHC: 35.1 g/dL (ref 30.0–36.0)
MCV: 87.7 fL (ref 80.0–100.0)
Platelets: 175 K/uL (ref 150–400)
RBC: 4.64 MIL/uL (ref 4.22–5.81)
RDW: 12.4 % (ref 11.5–15.5)
WBC: 8.7 K/uL (ref 4.0–10.5)
nRBC: 0 % (ref 0.0–0.2)

## 2021-05-25 LAB — COMPREHENSIVE METABOLIC PANEL WITH GFR
ALT: 42 U/L (ref 0–44)
AST: 18 U/L (ref 15–41)
Albumin: 3.6 g/dL (ref 3.5–5.0)
Alkaline Phosphatase: 60 U/L (ref 38–126)
Anion gap: 6 (ref 5–15)
BUN: 13 mg/dL (ref 6–20)
CO2: 26 mmol/L (ref 22–32)
Calcium: 8.1 mg/dL — ABNORMAL LOW (ref 8.9–10.3)
Chloride: 104 mmol/L (ref 98–111)
Creatinine, Ser: 1.3 mg/dL — ABNORMAL HIGH (ref 0.61–1.24)
GFR, Estimated: >60 mL/min (ref >60)
Glucose, Bld: 170 mg/dL — ABNORMAL HIGH (ref 70–99)
Potassium: 3.6 mmol/L (ref 3.5–5.1)
Sodium: 136 mmol/L (ref 135–145)
Total Bilirubin: 0.9 mg/dL (ref 0.3–1.2)
Total Protein: 6.1 g/dL — ABNORMAL LOW (ref 6.5–8.1)

## 2021-05-25 LAB — GLUCOSE, CAPILLARY
Glucose-Capillary: 195 mg/dL — ABNORMAL HIGH (ref 70–99)
Glucose-Capillary: 148 mg/dL — ABNORMAL HIGH (ref 70–99)
Glucose-Capillary: 139 mg/dL — ABNORMAL HIGH (ref 70–99)
Glucose-Capillary: 143 mg/dL — ABNORMAL HIGH (ref 70–99)
Glucose-Capillary: 259 mg/dL — ABNORMAL HIGH (ref 70–99)

## 2021-05-25 LAB — HEMOGLOBIN A1C
Hgb A1c MFr Bld: 8.2 % — ABNORMAL HIGH (ref 4.8–5.6)
Mean Plasma Glucose: 188.64 mg/dL

## 2021-05-25 SURGERY — CYSTOSCOPY/URETEROSCOPY/HOLMIUM LASER/STENT PLACEMENT
Anesthesia: General | Site: Penis | Laterality: Right

## 2021-05-25 MED ORDER — FENTANYL CITRATE PF 50 MCG/ML IJ SOSY
50.0000 ug | PREFILLED_SYRINGE | INTRAMUSCULAR | Status: AC
  Administered 2021-05-25: 50 ug via INTRAVENOUS
  Filled 2021-05-25: qty 1

## 2021-05-25 MED ORDER — SODIUM CHLORIDE 0.9 % IR SOLN
Status: DC | PRN
  Administered 2021-05-25: 3000 mL

## 2021-05-25 MED ORDER — OXYCODONE HCL 5 MG PO TABS: 5.0000 mg | ORAL_TABLET | Freq: Once | ORAL | Status: DC | PRN

## 2021-05-25 MED ORDER — LACTATED RINGERS IV SOLN: INTRAVENOUS | Status: DC

## 2021-05-25 MED ORDER — PROPOFOL 10 MG/ML IV BOLUS
INTRAVENOUS | Status: DC | PRN
  Administered 2021-05-25: 160 mg via INTRAVENOUS

## 2021-05-25 MED ORDER — FENTANYL CITRATE PF 50 MCG/ML IJ SOSY: 25.0000 ug | PREFILLED_SYRINGE | INTRAMUSCULAR | Status: DC | PRN

## 2021-05-25 MED ORDER — ONDANSETRON HCL 4 MG/2ML IJ SOLN: 4.0000 mg | Freq: Once | INTRAMUSCULAR | Status: DC | PRN

## 2021-05-25 MED ORDER — DEXAMETHASONE SODIUM PHOSPHATE 10 MG/ML IJ SOLN
INTRAMUSCULAR | Status: DC | PRN
  Administered 2021-05-25: 10 mg via INTRAVENOUS

## 2021-05-25 MED ORDER — FENTANYL CITRATE (PF) 100 MCG/2ML IJ SOLN
INTRAMUSCULAR | Status: AC
  Filled 2021-05-25: qty 2

## 2021-05-25 MED ORDER — ONDANSETRON HCL 4 MG/2ML IJ SOLN
INTRAMUSCULAR | Status: DC | PRN
  Administered 2021-05-25: 4 mg via INTRAVENOUS

## 2021-05-25 MED ORDER — MIDAZOLAM HCL 5 MG/5ML IJ SOLN
INTRAMUSCULAR | Status: DC | PRN
  Administered 2021-05-25: 2 mg via INTRAVENOUS

## 2021-05-25 MED ORDER — LIDOCAINE HCL (PF) 2 % IJ SOLN
INTRAMUSCULAR | Status: AC
  Filled 2021-05-25: qty 5

## 2021-05-25 MED ORDER — FENTANYL CITRATE (PF) 100 MCG/2ML IJ SOLN
INTRAMUSCULAR | Status: DC | PRN
  Administered 2021-05-25 (×2): 25 ug via INTRAVENOUS
  Administered 2021-05-25: 50 ug via INTRAVENOUS

## 2021-05-25 MED ORDER — ONDANSETRON HCL 4 MG/2ML IJ SOLN
INTRAMUSCULAR | Status: AC
  Filled 2021-05-25: qty 2

## 2021-05-25 MED ORDER — CEFAZOLIN SODIUM-DEXTROSE 2-3 GM-%(50ML) IV SOLR
INTRAVENOUS | Status: DC | PRN
  Administered 2021-05-25: 2 g via INTRAVENOUS

## 2021-05-25 MED ORDER — DEXAMETHASONE SODIUM PHOSPHATE 10 MG/ML IJ SOLN
INTRAMUSCULAR | Status: AC
  Filled 2021-05-25: qty 1

## 2021-05-25 MED ORDER — PROPOFOL 10 MG/ML IV BOLUS
INTRAVENOUS | Status: AC
  Filled 2021-05-25: qty 20

## 2021-05-25 MED ORDER — KETOROLAC TROMETHAMINE 30 MG/ML IJ SOLN: 30.0000 mg | Freq: Once | INTRAMUSCULAR | Status: DC | PRN

## 2021-05-25 MED ORDER — LIDOCAINE 2% (20 MG/ML) 5 ML SYRINGE
INTRAMUSCULAR | Status: DC | PRN
  Administered 2021-05-25: 100 mg via INTRAVENOUS

## 2021-05-25 MED ORDER — MIDAZOLAM HCL 2 MG/2ML IJ SOLN
INTRAMUSCULAR | Status: AC
  Filled 2021-05-25: qty 2

## 2021-05-25 MED ORDER — CEFAZOLIN SODIUM-DEXTROSE 2-4 GM/100ML-% IV SOLN
INTRAVENOUS | Status: AC
  Filled 2021-05-25: qty 100

## 2021-05-25 MED ORDER — IOHEXOL 300 MG/ML  SOLN
INTRAMUSCULAR | Status: DC | PRN
  Administered 2021-05-25: 4 mL

## 2021-05-25 MED ORDER — OXYCODONE HCL 5 MG/5ML PO SOLN: 5.0000 mg | Freq: Once | ORAL | Status: DC | PRN

## 2021-05-25 SURGICAL SUPPLY — 22 items
BAG URO CATCHER STRL LF (MISCELLANEOUS) ×2 IMPLANT
BASKET ZERO TIP NITINOL 2.4FR (BASKET) ×1 IMPLANT
CATH URET 5FR 28IN CONE TIP (BALLOONS)
CATH URET 5FR 70CM CONE TIP (BALLOONS) IMPLANT
CATH URETL OPEN END 6FR 70 (CATHETERS) ×2 IMPLANT
CLOTH BEACON ORANGE TIMEOUT ST (SAFETY) ×2 IMPLANT
GLOVE SURG ENC MOIS LTX SZ7.5 (GLOVE) ×2 IMPLANT
GOWN STRL REUS W/ TWL XL LVL3 (GOWN DISPOSABLE) ×1 IMPLANT
GOWN STRL REUS W/TWL XL LVL3 (GOWN DISPOSABLE) ×1
GUIDEWIRE STR DUAL SENSOR (WIRE) ×2 IMPLANT
GUIDEWIRE ZIPWRE .038 STRAIGHT (WIRE) IMPLANT
KIT TURNOVER KIT A (KITS) IMPLANT
LASER FIB FLEXIVA PULSE ID 365 (Laser) IMPLANT
MANIFOLD NEPTUNE II (INSTRUMENTS) ×2 IMPLANT
PACK CYSTO (CUSTOM PROCEDURE TRAY) ×2 IMPLANT
SHEATH NAVIGATOR HD 11/13X28 (SHEATH) IMPLANT
SHEATH NAVIGATOR HD 11/13X36 (SHEATH) IMPLANT
STENT URET 6FRX26 CONTOUR (STENTS) ×1 IMPLANT
TRACTIP FLEXIVA PULS ID 200XHI (Laser) IMPLANT
TRACTIP FLEXIVA PULSE ID 200 (Laser) ×1
TUBING CONNECTING 10 (TUBING) ×2 IMPLANT
TUBING UROLOGY SET (TUBING) ×2 IMPLANT

## 2021-05-25 NOTE — Transfer of Care (Signed)
Immediate Anesthesia Transfer of Care Note ? ?Patient: Jonathan Melendez ? ?Procedure(s) Performed: CYSTOSCOPY/URETEROSCOPY/HOLMIUM LASER/STENT PLACEMENT (Right: Penis) ? ?Patient Location: PACU ? ?Anesthesia Type:General ? ?Level of Consciousness: awake, alert  and oriented ? ?Airway & Oxygen Therapy: Patient Spontanous Breathing and Patient connected to face mask oxygen ? ?Post-op Assessment: Report given to RN and Post -op Vital signs reviewed and stable ? ?Post vital signs: Reviewed and stable ? ?Last Vitals:  ?Vitals Value Taken Time  ?BP 118/64 05/25/21 1621  ?Temp    ?Pulse 72 05/25/21 1624  ?Resp 16 05/25/21 1624  ?SpO2 97 % 05/25/21 1624  ?Vitals shown include unvalidated device data. ? ?Last Pain:  ?Vitals:  ? 05/25/21 1529  ?TempSrc:   ?PainSc: 3   ?   ? ?Patients Stated Pain Goal: 4 (05/25/21 1444) ? ?Complications: No notable events documented. ?

## 2021-05-25 NOTE — Hospital Course (Addendum)
71 yom w/ ankylosing spondylitis, type II DM, hyperlipidemia, hypertension, overweight, urolithiasis ?Presented with right flank pain since 0400 05/24/21 w/ associated with diaphoresis, multiple episodes of emesis and dry heaving.  His stools were mildly loose.   ?In ED afebrile blood bradycardia pulse 41, respirations 16, BP 157/86 mmHg O2 sat 100% on room air.  Patient received IV fluids morphine Toradol Zofran.  Lab with UDS with glucosuria ketonuria unremarkable microscopic exam, CBC normal lipase 71 CMP with glucose 258 ALT 57 ?CT abdomen/pelvis with contrast showed right-sided hydroureteronephrosis with perinephric and periureteral stranding due to a cluster of small obstructing stones at the right UVJ.  There was a 5 mm right lower lobe pulmonary nodule that has been seen on recent chest CT.  Follow-up recommended initially for in 6 to 12 months. ?Urology was consulted and patient was admitted for nephrolithiasis with symptomatic right UVJ stones. ?Urology recommended right ureteroscopic stone extraction versus ureteral stent placement ?Underwent right retrograde pyelogram cystoscopy right ureteroscopy laser lithotripsy right ureteral stent placement.  Patient was monitored overnight postop and did well.  Seen this morning cleared by cardiology for discharge patient on prolotherapy himself in 3 days to remove the stent and follow-up with urology as outpatient. ?

## 2021-05-25 NOTE — Anesthesia Preprocedure Evaluation (Signed)
Anesthesia Evaluation  ?Patient identified by MRN, date of birth, ID band ?Patient awake ? ? ? ?Reviewed: ?Allergy & Precautions, NPO status , Patient's Chart, lab work & pertinent test results ? ?Airway ?Mallampati: II ? ?TM Distance: >3 FB ?Neck ROM: Limited ? ? ? Dental ?no notable dental hx. ? ?  ?Pulmonary ?neg pulmonary ROS,  ?  ?Pulmonary exam normal ?breath sounds clear to auscultation ? ? ? ? ? ? Cardiovascular ?hypertension, Pt. on medications and Pt. on home beta blockers ?Normal cardiovascular exam ?Rhythm:Regular Rate:Normal ? ? ?  ?Neuro/Psych ?negative neurological ROS ? negative psych ROS  ? GI/Hepatic ?negative GI ROS, Neg liver ROS,   ?Endo/Other  ?diabetes, Type 2 ? Renal/GU ?negative Renal ROS  ?negative genitourinary ?  ?Musculoskeletal ? ?(+) Arthritis , Ankylosing spondylitis  ? Abdominal ?  ?Peds ?negative pediatric ROS ?(+)  Hematology ?negative hematology ROS ?(+)   ?Anesthesia Other Findings ? ? Reproductive/Obstetrics ?negative OB ROS ? ?  ? ? ? ? ? ? ? ? ? ? ? ? ? ?  ?  ? ? ? ? ? ? ? ? ?Anesthesia Physical ?Anesthesia Plan ? ?ASA: 3 ? ?Anesthesia Plan: General  ? ?Post-op Pain Management: Minimal or no pain anticipated  ? ?Induction: Intravenous ? ?PONV Risk Score and Plan: 2 and Ondansetron and Midazolam ? ?Airway Management Planned: LMA ? ?Additional Equipment:  ? ?Intra-op Plan:  ? ?Post-operative Plan: Extubation in OR ? ?Informed Consent: I have reviewed the patients History and Physical, chart, labs and discussed the procedure including the risks, benefits and alternatives for the proposed anesthesia with the patient or authorized representative who has indicated his/her understanding and acceptance.  ? ? ? ?Dental advisory given ? ?Plan Discussed with: CRNA and Surgeon ? ?Anesthesia Plan Comments:   ? ? ? ? ? ? ?Anesthesia Quick Evaluation ? ?

## 2021-05-25 NOTE — Op Note (Signed)
Preoperative diagnosis: Right ureteral stones, right hydronephrosis ?Postoperative diagnosis: Same ? ?Procedure: Cystoscopy, right retrograde pyelogram, right ureteroscopy laser lithotripsy right ureteral stent placement ? ?Surgeon: Junious Silk ? ?Anesthesia: General ? ?Indication for procedure: Jonathan Melendez is a 51 year old male who developed right flank pain and had 3-4 small stones in the right UVJ intramural ureter.  He had persistent pain and nausea.  He strained his urine.  Started on tamsulosin.  No stone passage. ? ?Findings: On exam under anesthesia the penis was circumcised and without mass or lesion.  The scrotum appeared normal.  On DRE the prostate was about 30 g and smooth without hard area or nodule. ? ?On cystoscopy the urethra and prostatic urethra unremarkable.  The bladder contained some yellow stone fragments and I suspect he passed some of the smaller stones.  On ureteroscopy there were 2 or 3 small yellow stones in the distal ureter and intramural ureter. ? ?Right retrograde pyelogram-this outlined a single ureter single collecting system unit with a small filling defect at the UVJ consistent with the stone(s).  Mild proximal hydroureteronephrosis. ? ?Description of procedure: After consent was obtained patient brought to the operating room.  After adequate anesthesia was placed lithotomy position and prepped and draped in the usual sterile fashion.  Timeout was performed to correct the patient and procedure.  Cystoscope was passed per urethra and the bladder inspected.  6 [place] open-ended catheter was used to cannulate the right ureteral orifice and retrograde injection of contrast was performed.  I then passed a sensor wire and coiled that in the upper calyx.  A dual channel semirigid was advanced and just inside the ureteral orifice I encountered a bright yellow stone.  There was not a lot of working room.  I did not want to engage the stone with the basket as I was not sure it would readily be  extractable and did not want to get the basket hung up.  Therefore I passed the 200 ?m laser fiber and on dusting dusted the stones and it broke the stone down and then a small stone behind it and the other stones and fragments moved into the distal ureter.  In the ureter a 0 tip basket was advanced the stones were removed and extracted.  Further inspection revealed there to be no other stones in the ureter.  I was able to pass the semirigid up into the proximal ureter without other stones noted.  No stones were noted in the kidney, therefore renoscopy was not performed.  The semirigid was backed out and again the ureter was noted to be normal without any injury and no stone or stone fragment.  The wire was backloaded on the cystoscope and a 6 x 26 cm stent advanced.  The wire was removed with a good coil seen in the kidney and a good coil in the bladder.  The bladder was drained and the scope removed.  I did an exam under anesthesia.  I left a string on the stent that was taped to the patient.  He was awakened and taken to the cover room in stable condition. ? ?Complications: None ? ?Blood loss: Minimal ? ?Specimens: Stone fragments to office lab ? ?Drains: 6 x 26 cm right ureteral stent with string ? ?Disposition: Patient stable to PACU ? ? ?

## 2021-05-25 NOTE — Plan of Care (Signed)

## 2021-05-25 NOTE — Anesthesia Procedure Notes (Signed)
Procedure Name: LMA Insertion ?Date/Time: 05/25/2021 3:39 PM ?Performed by: Jorell Agne D, CRNA ?Pre-anesthesia Checklist: Patient identified, Emergency Drugs available, Suction available and Patient being monitored ?Patient Re-evaluated:Patient Re-evaluated prior to induction ?Oxygen Delivery Method: Circle system utilized ?Preoxygenation: Pre-oxygenation with 100% oxygen ?Induction Type: IV induction ?Ventilation: Mask ventilation without difficulty ?LMA: LMA inserted ?LMA Size: 4.0 ?Tube type: Oral ?Number of attempts: 1 ?Placement Confirmation: positive ETCO2 and breath sounds checked- equal and bilateral ?Tube secured with: Tape ?Dental Injury: Teeth and Oropharynx as per pre-operative assessment  ? ? ? ? ?

## 2021-05-25 NOTE — Progress Notes (Signed)
Urology Progress Note  ? ?51 y.o. male with Hx of nephrolithiasis who presents with right UVJ stones and pain/N/V.  ? ?Subjective: ?NAEON.  ?Still with intermittent nausea and pain ?Did not see any stones pass overnight ? ?Objective: ?Vital signs in last 24 hours: ?Temp:  [97.9 ?F (36.6 ?C)-98.5 ?F (36.9 ?C)] 98.3 ?F (36.8 ?C) (04/06 5009) ?Pulse Rate:  [41-71] 67 (04/06 0615) ?Resp:  [11-19] 17 (04/06 0615) ?BP: (139-176)/(83-109) 139/87 (04/06 0615) ?SpO2:  [92 %-100 %] 96 % (04/06 0615) ?Weight:  [84 kg] 84 kg (04/05 0732) ? ?Intake/Output from previous day: ?04/05 0701 - 04/06 0700 ?In: 2392.6 [I.V.:1392.6; IV Piggyback:1000] ?Out: -  ?Intake/Output this shift: ?No intake/output data recorded. ? ?Physical Exam:  ?General: Alert and oriented ?CV: Regular rate ?Lungs: No increased work of breathing ?Abdomen: Soft, non-tender, non-distended ?GU: right CVA tenderness ?Ext: NT, No erythema ? ?Lab Results: ?Recent Labs  ?  05/24/21 ?0740 05/25/21 ?3818  ?HGB 15.4 14.3  ?HCT 44.2 40.7  ? ?Recent Labs  ?  05/24/21 ?0740 05/25/21 ?0414  ?NA 137 136  ?K 4.2 3.6  ?CL 103 104  ?CO2 22 26  ?GLUCOSE 258* 170*  ?BUN 12 13  ?CREATININE 1.19 1.30*  ?CALCIUM 9.4 8.1*  ? ? ?Studies/Results: ?CT ABDOMEN PELVIS W CONTRAST ? ?Result Date: 05/24/2021 ?CLINICAL DATA:  Abdominal pain, acute, nonlocalized abd pain EXAM: CT ABDOMEN AND PELVIS WITH CONTRAST TECHNIQUE: Multidetector CT imaging of the abdomen and pelvis was performed using the standard protocol following bolus administration of intravenous contrast. RADIATION DOSE REDUCTION: This exam was performed according to the departmental dose-optimization program which includes automated exposure control, adjustment of the mA and/or kV according to patient size and/or use of iterative reconstruction technique. CONTRAST:  54m OMNIPAQUE IOHEXOL 300 MG/ML  SOLN COMPARISON:  CT abdomen pelvis 06/01/2020, CT 08/04/2019 FINDINGS: Lower chest: There is a 5 mm right lower lobe pulmonary nodule  (series 2, image 14), which is stable dating back to June 2021. Hepatobiliary: Slight decreased liver attenuation favored to be due to contrast timing. No focal liver lesion. The gallbladder is unremarkable. Pancreas: Unremarkable. No pancreatic ductal dilatation or surrounding inflammatory changes. Spleen: Normal in size without focal abnormality. Adrenals/Urinary Tract: Adrenal glands are unremarkable. There is right-sided hydroureteronephrosis with perinephric and periureteral stranding due to a cluster of obstructing small stones at the right ureterovesicular junction (series 2, image 77). There is adjacent fluid tracking in the retroperitoneum along the retroperitoneal portions of the duodenum, and inferiorly with a significant portion of this fluid tracking along the distal right ureter, this fluid is all likely reactive. There is an additional punctate nonobstructive stone in the right mid kidney. No left-sided hydronephrosis or nephrolithiasis. Stomach/Bowel: The stomach is within normal limits. There is no evidence of bowel obstruction.There is no bowel wall thickening. The appendix is normal. Vascular/Lymphatic: No significant vascular findings are present. No enlarged abdominal or pelvic lymph nodes. Reproductive: Unremarkable. Other: No hernia. No free air. Retroperitoneal fluid as described above. Musculoskeletal: No acute osseous abnormality. No suspicious lytic or blastic lesions. Multilevel degenerative changes spine worst at L4-L5 and L5-S1. Mild bilateral hip arthritis. IMPRESSION: Right-sided hydroureteronephrosis with perinephric and periureteral stranding due to a cluster of small obstructing stones at the right ureterovesicular junction. There is fluid in the right retroperitoneum tracking from the upper mid abdomen into the pelvis, likely reactive and related to the right nephroureteral process. Correlate with urinalysis for signs of infection. Additional punctate nonobstructive stone in the  right mid kidney. 5  mm right lower lobe pulmonary nodule as seen on recent chest CT. Recommend continued chest CT follow-up as recommended on prior chest CT in September 2022. Electronically Signed   By: Maurine Simmering M.D.   On: 05/24/2021 09:48   ? ?Assessment/Plan: ? ?51 y.o. male with Hx of nephrolithiasis who presents with right UVJ stones and pain/N/V.  ? ?- Continue NPO ?- Will post for right ureteroscopic stone extraction vs. Ureteral stent placement today, likely after 3 PM ? ?Reola Mosher, MD ?[facility] Urology Resident, PGY4 ?[facility] ? ? ? LOS: 0 days  ? ?

## 2021-05-25 NOTE — Progress Notes (Signed)
?  Transition of Care (TOC) Screening Note ? ? ?Patient Details  ?Name: Jonathan Melendez ?Date of Birth: 1970/12/30 ? ? ?Transition of Care [facility]) CM/SW Contact:    ?Dessa Phi, RN ?Phone Number: ?05/25/2021, 10:21 AM ? ? ? ?Transition of Care Department [facility]) has reviewed patient and no TOC needs have been identified at this time. We will continue to monitor patient advancement through interdisciplinary progression rounds. If new patient transition needs arise, please place a TOC consult. ?  ?

## 2021-05-25 NOTE — Progress Notes (Signed)
 PROGRESS NOTE Jonathan Melendez  FMW:[id number] DOB: September 08, 1970 DOA: 05/24/2021 PCP: Lazoff, Shawn P, DO   Brief Narrative/Hospital Course: 27 yom w/ ankylosing spondylitis, type II DM, hyperlipidemia, hypertension, overweight, urolithiasis Presented with right flank pain since 0400 05/24/21 w/ associated with diaphoresis, multiple episodes of emesis and dry heaving.  His stools were mildly loose.   In ED afebrile blood bradycardia pulse 41, respirations 16, BP 157/86 mmHg O2 sat 100% on room air.  Patient received IV fluids morphine  Toradol  Zofran .  Lab with UDS with glucosuria ketonuria unremarkable microscopic exam, CBC normal lipase 71 CMP with glucose 258 ALT 57 CT abdomen/pelvis with contrast showed right-sided hydroureteronephrosis with perinephric and periureteral stranding due to a cluster of small obstructing stones at the right UVJ.  There was a 5 mm right lower lobe pulmonary nodule that has been seen on recent chest CT.  Follow-up recommended initially for in 6 to 12 months. Urology was consulted and patient was admitted for nephrolithiasis with symptomatic right UVJ stones. Urology recommended right ureteroscopic stone extraction versus ureteral stent placement    Subjective: Patient complains of pain on the right flank this morning, on oral oxy also needing IV Dilaudid  so far 3 doses since admission Assessment and Plan: Principal Problem:   Hydronephrosis of right kidney Active Problems:   Essential hypertension   Hyperlipidemia   Ankylosing spondylitis (HCC)   Overweight (BMI 25.0-29.9)   Type 2 diabetes mellitus with hyperglycemia (HCC)   Pulmonary nodule   Sinus bradycardia   Urolithiasis  Hydronephrosis of right kidney Symptomatic nephrolithiasis with right UVJ stones: Appreciate urology input,Urology recommended right ureteroscopic stone extraction versus ureteral stent placement.  Continue IV hydration, Flomax , pain control with IV Dilaudid  (received 3 doses of IV  Dilaudid  so far), cont antiemetics.  Essential hypertension: BP well controlled.  Holding metoprolol  due to bradycardia.  Holding valsartan.    Hyperlipidemia: On Lipitor  Ankylosing spondylitis history: Outpatient follow-up  T2DM with hyperglycemia blood sugar 258 on admission:Continue sliding scale insulin .  Hold metformin  and sulfonylurea. Recent Labs  Lab 05/24/21 1653 05/24/21 2139 05/25/21 0414 05/25/21 0749  GLUCAP 160* 152*  --  195*  HGBA1C  --   --  8.2*  --     Pulmonary nodule: Incidentally noted will need to follow-up in 6-12 months Sinus bradycardia: Transient in the ED currently stable, holding metoprolol  Mildly elevated creatinine monitor closely continue IV fluids  DVT prophylaxis: SCDs Start: 05/24/21 1413 Code Status:   Code Status: Full Code Family Communication: plan of care discussed with patient at bedside. Patient status is: Observation but will need at least 2 midnight stay for ongoing management of symptomatic nephrolithiasis level of care: Telemetry  Remains inpatient because: Ongoing management of symptomatic nephrolithiasis with operative intervention Patient currently not stable  Dispo: The patient is from: Home            Anticipated disposition: Home once cleared by urology  Mobility Assessment (last 72 hours)     Mobility Assessment     Row Name 05/24/21 1800           Does patient have an order for bedrest or is patient medically unstable No - Continue assessment       What is the highest level of mobility based on the progressive mobility assessment? Level 6 (Walks independently in room and hall) - Balance while walking in room without assist - Complete                 Objective: Vitals  last 24 hrs: Vitals:   05/24/21 1806 05/24/21 2141 05/25/21 0207 05/25/21 0615  BP: (!) 149/89 (!) 144/84 (!) 142/85 139/87  Pulse: 60 61 69 67  Resp: 18 16 16 17   Temp: 98.5 F (36.9 C) 98.3 F (36.8 C) 98 F (36.7 C) 98.3 F (36.8 C)   TempSrc: Oral Oral Oral Oral  SpO2: 94% 94% 92% 96%  Weight:      Height:       Weight change:   Physical Examination: General exam: AA0x3,older than stated age, weak appearing. HEENT:Oral mucosa moist, Ear/Nose WNL grossly, dentition normal. Respiratory system: bilaterally diminished BS, no use of accessory muscle Cardiovascular system: S1 & S2 +, No JVD,. Gastrointestinal system: Abdomen soft, tender right side abdomen and right flank, ND, BS+ Nervous System:Alert, awake, moving extremities and grossly nonfocal Extremities: LE edema none,distal peripheral pulses palpable.  Skin: No rashes,no icterus. MSK: Normal muscle bulk,tone, power  Medications reviewed: Scheduled Meds:  atorvastatin   20 mg Oral Daily   insulin  aspart  0-15 Units Subcutaneous TID WC   tamsulosin   0.4 mg Oral QPC supper   Continuous Infusions:  sodium chloride  125 mL/hr at 05/25/21 0150      Diet Order             Diet NPO time specified  Diet effective midnight                 Net IO Since Admission: 2,392.61 mL [05/25/21 1046]  Wt Readings from Last 3 Encounters:  05/24/21 84 kg  06/01/20 84.4 kg  08/04/19 85.6 kg     Unresulted Labs (From admission, onward)    None     Data Reviewed: I have personally reviewed following labs and imaging studies CBC: Recent Labs  Lab 05/24/21 0740 05/25/21 0414  WBC 9.9 8.7  HGB 15.4 14.3  HCT 44.2 40.7  MCV 85.5 87.7  PLT 204 175   Basic Metabolic Panel: Recent Labs  Lab 05/24/21 0740 05/25/21 0414  NA 137 136  K 4.2 3.6  CL 103 104  CO2 22 26  GLUCOSE 258* 170*  BUN 12 13  CREATININE 1.19 1.30*  CALCIUM  9.4 8.1*   GFR: Estimated Creatinine Clearance: 70.1 mL/min (A) (by C-G formula based on SCr of 1.3 mg/dL (H)). Liver Function Tests: Recent Labs  Lab 05/24/21 0740 05/25/21 0414  AST 28 18  ALT 57* 42  ALKPHOS 71 60  BILITOT 1.0 0.9  PROT 6.9 6.1*  ALBUMIN 4.3 3.6   Recent Labs  Lab 05/24/21 0740  LIPASE 71*    No results for input(s): AMMONIA in the last 168 hours. Coagulation Profile: No results for input(s): INR, PROTIME in the last 168 hours. BNP (last 3 results) No results for input(s): PROBNP in the last 8760 hours. HbA1C: Recent Labs    05/25/21 0414  HGBA1C 8.2*   CBG: Recent Labs  Lab 05/24/21 1653 05/24/21 2139 05/25/21 0749  GLUCAP 160* 152* 195*   Lipid Profile: No results for input(s): CHOL, HDL, LDLCALC, TRIG, CHOLHDL, LDLDIRECT in the last 72 hours. Thyroid Function Tests: No results for input(s): TSH, T4TOTAL, FREET4, T3FREE, THYROIDAB in the last 72 hours. Sepsis Labs: No results for input(s): PROCALCITON, LATICACIDVEN in the last 168 hours.  No results found for this or any previous visit (from the past 240 hour(s)).  Antimicrobials: Anti-infectives (From admission, onward)    None      Culture/Microbiology No results found for: SDES, SPECREQUEST, CULT, REPTSTATUS  Other culture-see note  Radiology Studies: CT ABDOMEN PELVIS W CONTRAST  Result Date: 05/24/2021 CLINICAL DATA:  Abdominal pain, acute, nonlocalized abd pain EXAM: CT ABDOMEN AND PELVIS WITH CONTRAST TECHNIQUE: Multidetector CT imaging of the abdomen and pelvis was performed using the standard protocol following bolus administration of intravenous contrast. RADIATION DOSE REDUCTION: This exam was performed according to the departmental dose-optimization program which includes automated exposure control, adjustment of the mA and/or kV according to patient size and/or use of iterative reconstruction technique. CONTRAST:  85mL OMNIPAQUE  IOHEXOL  300 MG/ML  SOLN COMPARISON:  CT abdomen pelvis 06/01/2020, CT 08/04/2019 FINDINGS: Lower chest: There is a 5 mm right lower lobe pulmonary nodule (series 2, image 14), which is stable dating back to June 2021. Hepatobiliary: Slight decreased liver attenuation favored to be due to contrast timing. No focal liver lesion. The gallbladder is unremarkable. Pancreas:  Unremarkable. No pancreatic ductal dilatation or surrounding inflammatory changes. Spleen: Normal in size without focal abnormality. Adrenals/Urinary Tract: Adrenal glands are unremarkable. There is right-sided hydroureteronephrosis with perinephric and periureteral stranding due to a cluster of obstructing small stones at the right ureterovesicular junction (series 2, image 77). There is adjacent fluid tracking in the retroperitoneum along the retroperitoneal portions of the duodenum, and inferiorly with a significant portion of this fluid tracking along the distal right ureter, this fluid is all likely reactive. There is an additional punctate nonobstructive stone in the right mid kidney. No left-sided hydronephrosis or nephrolithiasis. Stomach/Bowel: The stomach is within normal limits. There is no evidence of bowel obstruction.There is no bowel wall thickening. The appendix is normal. Vascular/Lymphatic: No significant vascular findings are present. No enlarged abdominal or pelvic lymph nodes. Reproductive: Unremarkable. Other: No hernia. No free air. Retroperitoneal fluid as described above. Musculoskeletal: No acute osseous abnormality. No suspicious lytic or blastic lesions. Multilevel degenerative changes spine worst at L4-L5 and L5-S1. Mild bilateral hip arthritis. IMPRESSION: Right-sided hydroureteronephrosis with perinephric and periureteral stranding due to a cluster of small obstructing stones at the right ureterovesicular junction. There is fluid in the right retroperitoneum tracking from the upper mid abdomen into the pelvis, likely reactive and related to the right nephroureteral process. Correlate with urinalysis for signs of infection. Additional punctate nonobstructive stone in the right mid kidney. 5 mm right lower lobe pulmonary nodule as seen on recent chest CT. Recommend continued chest CT follow-up as recommended on prior chest CT in September 2022. Electronically Signed   By: Jacob  Kahn  M.D.   On: 05/24/2021 09:48     LOS: 0 days   Mennie LAMY, MD [facility]  05/25/2021, 10:46 AM

## 2021-05-26 ENCOUNTER — Encounter (HOSPITAL_COMMUNITY): Payer: Self-pay | Admitting: Urology

## 2021-05-26 DIAGNOSIS — N1330 Unspecified hydronephrosis: Secondary | ICD-10-CM | POA: Diagnosis not present

## 2021-05-26 LAB — CBC
HCT: 47.9 % (ref 39.0–52.0)
Hemoglobin: 16.3 g/dL (ref 13.0–17.0)
MCH: 29.6 pg (ref 26.0–34.0)
MCHC: 34 g/dL (ref 30.0–36.0)
MCV: 86.9 fL (ref 80.0–100.0)
Platelets: 230 K/uL (ref 150–400)
RBC: 5.51 MIL/uL (ref 4.22–5.81)
RDW: 12.1 % (ref 11.5–15.5)
WBC: 11.3 K/uL — ABNORMAL HIGH (ref 4.0–10.5)
nRBC: 0 % (ref 0.0–0.2)

## 2021-05-26 LAB — BASIC METABOLIC PANEL WITH GFR
Anion gap: 10 (ref 5–15)
BUN: 12 mg/dL (ref 6–20)
CO2: 25 mmol/L (ref 22–32)
Calcium: 9.1 mg/dL (ref 8.9–10.3)
Chloride: 101 mmol/L (ref 98–111)
Creatinine, Ser: 1.11 mg/dL (ref 0.61–1.24)
GFR, Estimated: >60 mL/min (ref >60)
Glucose, Bld: 219 mg/dL — ABNORMAL HIGH (ref 70–99)
Potassium: 3.6 mmol/L (ref 3.5–5.1)
Sodium: 136 mmol/L (ref 135–145)

## 2021-05-26 LAB — GLUCOSE, CAPILLARY
Glucose-Capillary: 218 mg/dL — ABNORMAL HIGH (ref 70–99)
Glucose-Capillary: 158 mg/dL — ABNORMAL HIGH (ref 70–99)

## 2021-05-26 NOTE — Progress Notes (Signed)
1 Day Post-Op ?Subjective: ?No acute events. Patient is feeling much better today after surgery yesterday ? ?Objective: ?Vital signs in last 24 hours: ?Temp:  [97.8 ?F (36.6 ?C)-98.6 ?F (37 ?C)] 97.8 ?F (36.6 ?C) (04/07 1610) ?Pulse Rate:  [66-87] 66 (04/07 0442) ?Resp:  [13-18] 16 (04/07 0442) ?BP: (109-157)/(64-99) 136/82 (04/07 0442) ?SpO2:  [91 %-97 %] 95 % (04/07 0442) ?Weight:  [84 kg] 84 kg (04/06 1444) ? ?Intake/Output from previous day: ?04/06 0701 - 04/07 0700 ?In: 3280.1 [P.O.:60; I.V.:3220.1] ?Out: 1300 [Urine:1300] ?Intake/Output this shift: ?No intake/output data recorded. ? ?Physical Exam:  ?General: Alert and oriented ?CV: RRR ?Lungs: Clear ?Abdomen: Soft, ND, ATTP ?Ext: NT, No erythema ? ?Lab Results: ?Recent Labs  ?  05/24/21 ?0740 05/25/21 ?9604 05/26/21 ?0428  ?HGB 15.4 14.3 16.3  ?HCT 44.2 40.7 47.9  ? ?BMET ?Recent Labs  ?  05/25/21 ?0414 05/26/21 ?0428  ?NA 136 136  ?K 3.6 3.6  ?CL 104 101  ?CO2 26 25  ?GLUCOSE 170* 219*  ?BUN 13 12  ?CREATININE 1.30* 1.11  ?CALCIUM 8.1* 9.1  ? ? ? ?Studies/Results: ?DG C-Arm 1-60 Min-No Report ? ?Result Date: 05/25/2021 ?Fluoroscopy was utilized by the requesting physician.  No radiographic interpretation.   ? ?Assessment/Plan: ?Jonathan Melendez is POD 1 s/p ureteroscopy with laser litho ? ?-ok to d/c from GU standpoint ?-stent to be removed by patient on monday ? ? LOS: 1 day  ? ?Donald Pore MD ?05/26/2021, 12:52 PM ?[facility]  ?Pager: [phone] ? ? ? ?

## 2021-05-26 NOTE — Discharge Summary (Signed)
 Physician Discharge Summary  Jonathan Melendez FMW:[id number] DOB: [birth year] DOA: 05/24/2021  PCP: Lazoff, Shawn P, DO  Admit date: 05/24/2021 Discharge date: 05/26/2021 Recommendations for Outpatient Follow-up:  Follow up with PCP in 1 weeks-call for appointment Please obtain BMP/CBC in one week Follow-up with urology Follow-up with PCP to consider repeat CT of the chest in 6 to 12 months for lung nodule  Discharge Dispo: home Discharge Condition: Stable Code Status:   Code Status: Full Code Diet recommendation:  Diet Order             Diet - low sodium heart healthy           Diet regular Room service appropriate? Yes; Fluid consistency: Thin  Diet effective now                    Brief/Interim Summary: 50 yom w/ ankylosing spondylitis, type II DM, hyperlipidemia, hypertension, overweight, urolithiasis Presented with right flank pain since 0400 05/24/21 w/ associated with diaphoresis, multiple episodes of emesis and dry heaving.  His stools were mildly loose.   In ED afebrile blood bradycardia pulse 41, respirations 16, BP 157/86 mmHg O2 sat 100% on room air.  Patient received IV fluids morphine  Toradol  Zofran .  Lab with UDS with glucosuria ketonuria unremarkable microscopic exam, CBC normal lipase 71 CMP with glucose 258 ALT 57 CT abdomen/pelvis with contrast showed right-sided hydroureteronephrosis with perinephric and periureteral stranding due to a cluster of small obstructing stones at the right UVJ.  There was a 5 mm right lower lobe pulmonary nodule that has been seen on recent chest CT.  Follow-up recommended initially for in 6 to 12 months. Urology was consulted and patient was admitted for nephrolithiasis with symptomatic right UVJ stones. Urology recommended right ureteroscopic stone extraction versus ureteral stent placement Underwent right retrograde pyelogram cystoscopy right ureteroscopy laser lithotripsy right ureteral stent placement.  Patient was monitored overnight  postop and did well.  Seen this morning cleared by cardiology for discharge patient on prolotherapy himself in 3 days to remove the stent and follow-up with urology as outpatient.   Discharge Diagnoses:  Principal Problem:   Hydronephrosis of right kidney Active Problems:   Essential hypertension   Hyperlipidemia   Ankylosing spondylitis (HCC)   Overweight (BMI 25.0-29.9)   Type 2 diabetes mellitus with hyperglycemia (HCC)   Pulmonary nodule   Sinus bradycardia   Urolithiasis   Nephrolithiasis  Hydronephrosis of right kidney Symptomatic nephrolithiasis with right UVJ stones: Underwent right retrograde pyelogram cystoscopy right ureteroscopy laser lithotripsy right ureteral stent placement.  Patient was monitored overnight postop and did well.  Seen this morning cleared by cardiology for discharge patient on prolotherapy himself in 3 days to remove the stent and follow-up with urology as outpatient.   Essential hypertension: BP well controlled.  Holding metoprolol  due to bradycardia.  Holding valsartan.     Hyperlipidemia: On Lipitor   Ankylosing spondylitis history: Outpatient follow-up   T2DM with hyperglycemia blood sugar 258 on admission:Continue sliding scale insulin .  Hold metformin  and sulfonylurea.  Pulmonary nodule: Incidentally noted will need to follow-up in 6-12 months Sinus bradycardia: Transient in the ED currently stable, holding metoprolol  Mildly elevated creatinine monitor closely continue IV fluids  Consults: Urology, PCP Subjective: Alert awake oriented resting comfortably not much pain besides some pain during micturition  Discharge Exam: Vitals:   05/25/21 2046 05/26/21 0442  BP: 137/82 136/82  Pulse: 79 66  Resp: 16 16  Temp: 98.6 F (37 C) 97.8 F (36.6 C)  SpO2: 94% 95%   General: Pt is alert, awake, not in acute distress Cardiovascular: RRR, S1/S2 +, no rubs, no gallops Respiratory: CTA bilaterally, no wheezing, no rhonchi Abdominal: Soft,  NT, ND, bowel sounds + Extremities: no edema, no cyanosis  Discharge Instructions  Discharge Instructions     Diet - low sodium heart healthy   Complete by: As directed    Discharge instructions   Complete by: As directed    Remove the thread w/ stent as instructed by your urologist.  Follow-up with urology team.  Follow-up with PCP urology.  Follow-up with PCP to consider repeat CT of the chest in 6 to 12 months for lung nodule  Please call call MD or return to ER for similar or worsening recurring problem that brought you to hospital or if any fever,nausea/vomiting,abdominal pain, uncontrolled pain, chest pain,  shortness of breath or any other alarming symptoms.  Please follow-up your doctor as instructed in a week time and call the office for appointment.  Please avoid alcohol, smoking, or any other illicit substance and maintain healthy habits including taking your regular medications as prescribed.  You were cared for by a hospitalist during your hospital stay. If you have any questions about your discharge medications or the care you received while you were in the hospital after you are discharged, you can call the unit and ask to speak with the hospitalist on call if the hospitalist that took care of you is not available.  Once you are discharged, your primary care physician will handle any further medical issues. Please note that NO REFILLS for any discharge medications will be authorized once you are discharged, as it is imperative that you return to your primary care physician (or establish a relationship with a primary care physician if you do not have one) for your aftercare needs so that they can reassess your need for medications and monitor your lab values   Increase activity slowly   Complete by: As directed       Allergies as of 05/26/2021   No Known Allergies      Medication List     STOP taking these medications    valsartan 80 MG tablet Commonly known as:  DIOVAN       TAKE these medications    acetaminophen  500 MG tablet Commonly known as: TYLENOL  Take 1-2 tablets (500-1,000 mg total) by mouth every 8 (eight) hours as needed for mild pain.   atorvastatin  20 MG tablet Commonly known as: LIPITOR Take 20 mg by mouth daily.   glimepiride  2 MG tablet Commonly known as: AMARYL  Take 4 mg by mouth daily.   metFORMIN  500 MG 24 hr tablet Commonly known as: GLUCOPHAGE -XR Take 1,000 mg by mouth in the morning and at bedtime.   metoprolol  succinate 50 MG 24 hr tablet Commonly known as: TOPROL -XL Take 50 mg by mouth daily.   MULTIVITAMIN ADULT PO Take 1 tablet by mouth daily.   nitroGLYCERIN  0.4 MG SL tablet Commonly known as: NITROSTAT  Place 1 tablet (0.4 mg total) under the tongue every 5 (five) minutes as needed for chest pain. Max 3 doses in 15 minutes   traMADol  50 MG tablet Commonly known as: ULTRAM  Take 50 mg by mouth every 8 (eight) hours as needed.        Follow-up Information     Lazoff, Shawn P, DO .   Specialty: Family Medicine Contact information: [address] [place] [address] [phone]  Nieves Cough, MD. Call.   Specialty: Urology Why: You will need to be seen in about 6 weeks to check an ultrasound of the right kidney to make sure everything healed up and to discuss stone prevention. Contact information: [address] [place] [address] [phone]                No Known Allergies  The results of significant diagnostics from this hospitalization (including imaging, microbiology, ancillary and laboratory) are listed below for reference.    Microbiology: No results found for this or any previous visit (from the past 240 hour(s)).  Procedures/Studies: CT ABDOMEN PELVIS W CONTRAST  Result Date: 05/24/2021 CLINICAL DATA:  Abdominal pain, acute, nonlocalized abd pain EXAM: CT ABDOMEN AND PELVIS WITH CONTRAST TECHNIQUE: Multidetector CT imaging of the abdomen and  pelvis was performed using the standard protocol following bolus administration of intravenous contrast. RADIATION DOSE REDUCTION: This exam was performed according to the departmental dose-optimization program which includes automated exposure control, adjustment of the mA and/or kV according to patient size and/or use of iterative reconstruction technique. CONTRAST:  85mL OMNIPAQUE  IOHEXOL  300 MG/ML  SOLN COMPARISON:  CT abdomen pelvis 06/01/2020, CT 08/04/2019 FINDINGS: Lower chest: There is a 5 mm right lower lobe pulmonary nodule (series 2, image 14), which is stable dating back to June 2021. Hepatobiliary: Slight decreased liver attenuation favored to be due to contrast timing. No focal liver lesion. The gallbladder is unremarkable. Pancreas: Unremarkable. No pancreatic ductal dilatation or surrounding inflammatory changes. Spleen: Normal in size without focal abnormality. Adrenals/Urinary Tract: Adrenal glands are unremarkable. There is right-sided hydroureteronephrosis with perinephric and periureteral stranding due to a cluster of obstructing small stones at the right ureterovesicular junction (series 2, image 77). There is adjacent fluid tracking in the retroperitoneum along the retroperitoneal portions of the duodenum, and inferiorly with a significant portion of this fluid tracking along the distal right ureter, this fluid is all likely reactive. There is an additional punctate nonobstructive stone in the right mid kidney. No left-sided hydronephrosis or nephrolithiasis. Stomach/Bowel: The stomach is within normal limits. There is no evidence of bowel obstruction.There is no bowel wall thickening. The appendix is normal. Vascular/Lymphatic: No significant vascular findings are present. No enlarged abdominal or pelvic lymph nodes. Reproductive: Unremarkable. Other: No hernia. No free air. Retroperitoneal fluid as described above. Musculoskeletal: No acute osseous abnormality. No suspicious lytic or  blastic lesions. Multilevel degenerative changes spine worst at L4-L5 and L5-S1. Mild bilateral hip arthritis. IMPRESSION: Right-sided hydroureteronephrosis with perinephric and periureteral stranding due to a cluster of small obstructing stones at the right ureterovesicular junction. There is fluid in the right retroperitoneum tracking from the upper mid abdomen into the pelvis, likely reactive and related to the right nephroureteral process. Correlate with urinalysis for signs of infection. Additional punctate nonobstructive stone in the right mid kidney. 5 mm right lower lobe pulmonary nodule as seen on recent chest CT. Recommend continued chest CT follow-up as recommended on prior chest CT in September 2022. Electronically Signed   By: Jacob  Kahn M.D.   On: 05/24/2021 09:48   DG C-Arm 1-60 Min-No Report  Result Date: 05/25/2021 Fluoroscopy was utilized by the requesting physician.  No radiographic interpretation.    Labs: BNP (last 3 results) No results for input(s): BNP in the last 8760 hours. Basic Metabolic Panel: Recent Labs  Lab 05/24/21 0740 05/25/21 0414 05/26/21 0428  NA 137 136 136  K 4.2 3.6 3.6  CL 103 104 101  CO2 22  26 25  GLUCOSE 258* 170* 219*  BUN 12 13 12   CREATININE 1.19 1.30* 1.11  CALCIUM  9.4 8.1* 9.1   Liver Function Tests: Recent Labs  Lab 05/24/21 0740 05/25/21 0414  AST 28 18  ALT 57* 42  ALKPHOS 71 60  BILITOT 1.0 0.9  PROT 6.9 6.1*  ALBUMIN 4.3 3.6   Recent Labs  Lab 05/24/21 0740  LIPASE 71*   No results for input(s): AMMONIA in the last 168 hours. CBC: Recent Labs  Lab 05/24/21 0740 05/25/21 0414 05/26/21 0428  WBC 9.9 8.7 11.3*  HGB 15.4 14.3 16.3  HCT 44.2 40.7 47.9  MCV 85.5 87.7 86.9  PLT 204 175 230   Cardiac Enzymes: No results for input(s): CKTOTAL, CKMB, CKMBINDEX, TROPONINI in the last 168 hours. BNP: Invalid input(s): POCBNP CBG: Recent Labs  Lab 05/25/21 1141 05/25/21 1448 05/25/21 1623 05/25/21 2044  05/26/21 0727  GLUCAP 148* 139* 143* 259* 218*   D-Dimer No results for input(s): DDIMER in the last 72 hours. Hgb A1c Recent Labs    05/25/21 0414  HGBA1C 8.2*   Lipid Profile No results for input(s): CHOL, HDL, LDLCALC, TRIG, CHOLHDL, LDLDIRECT in the last 72 hours. Thyroid function studies No results for input(s): TSH, T4TOTAL, T3FREE, THYROIDAB in the last 72 hours.  Invalid input(s): FREET3 Anemia work up No results for input(s): VITAMINB12, FOLATE, FERRITIN, TIBC, IRON, RETICCTPCT in the last 72 hours. Urinalysis    Component Value Date/Time   COLORURINE YELLOW 05/24/2021 0740   APPEARANCEUR CLEAR 05/24/2021 0740   LABSPEC 1.023 05/24/2021 0740   PHURINE 5.5 05/24/2021 0740   GLUCOSEU >1,000 (A) 05/24/2021 0740   HGBUR MODERATE (A) 05/24/2021 0740   BILIRUBINUR NEGATIVE 05/24/2021 0740   KETONESUR 40 (A) 05/24/2021 0740   PROTEINUR TRACE (A) 05/24/2021 0740   NITRITE NEGATIVE 05/24/2021 0740   LEUKOCYTESUR NEGATIVE 05/24/2021 0740   Sepsis Labs Invalid input(s): PROCALCITONIN,  WBC,  LACTICIDVEN Microbiology No results found for this or any previous visit (from the past 240 hour(s)).   Time coordinating discharge: 25 minutes  SIGNED: Mennie LAMY, MD  [facility] 05/26/2021, 10:24 AM  If 7PM-7AM, please contact night-coverage [email or web address]

## 2021-05-31 NOTE — Anesthesia Postprocedure Evaluation (Signed)
Anesthesia Post Note ? ?Patient: Jonathan Melendez ? ?Procedure(s) Performed: CYSTOSCOPY/URETEROSCOPY/HOLMIUM LASER/STENT PLACEMENT (Right: Penis) ? ?  ? ?Patient location during evaluation: PACU ?Anesthesia Type: General ?Level of consciousness: awake and alert ?Pain management: pain level controlled ?Vital Signs Assessment: post-procedure vital signs reviewed and stable ?Respiratory status: spontaneous breathing, nonlabored ventilation, respiratory function stable and patient connected to nasal cannula oxygen ?Cardiovascular status: blood pressure returned to baseline and stable ?Postop Assessment: no apparent nausea or vomiting ?Anesthetic complications: no ? ? ?No notable events documented. ? ?Last Vitals:  ?Vitals:  ? 05/25/21 2046 05/26/21 0442  ?BP: 137/82 136/82  ?Pulse: 79 66  ?Resp: 16 16  ?Temp: 37 ?C 36.6 ?C  ?SpO2: 94% 95%  ?  ?Last Pain:  ?Vitals:  ? 05/26/21 0839  ?TempSrc:   ?PainSc: 0-No pain  ? ? ?  ?  ?  ?  ?  ?  ? ?[name] ? ? ? ? ?

## 2021-07-18 ENCOUNTER — Emergency Department (HOSPITAL_BASED_OUTPATIENT_CLINIC_OR_DEPARTMENT_OTHER): Payer: BC Managed Care – PPO

## 2021-07-18 ENCOUNTER — Other Ambulatory Visit: Payer: Self-pay

## 2021-07-18 ENCOUNTER — Encounter (HOSPITAL_BASED_OUTPATIENT_CLINIC_OR_DEPARTMENT_OTHER): Payer: Self-pay

## 2021-07-18 ENCOUNTER — Emergency Department (HOSPITAL_BASED_OUTPATIENT_CLINIC_OR_DEPARTMENT_OTHER)
Admission: EM | Admit: 2021-07-18 | Discharge: 2021-07-18 | Disposition: A | Discharge status: Home / Self Care | Payer: BC Managed Care – PPO | Attending: Emergency Medicine | Admitting: Emergency Medicine

## 2021-07-18 DIAGNOSIS — N201 Calculus of ureter: Secondary | ICD-10-CM | POA: Insufficient documentation

## 2021-07-18 DIAGNOSIS — R001 Bradycardia, unspecified: Secondary | ICD-10-CM | POA: Diagnosis not present

## 2021-07-18 DIAGNOSIS — Z7984 Long term (current) use of oral hypoglycemic drugs: Secondary | ICD-10-CM | POA: Diagnosis not present

## 2021-07-18 DIAGNOSIS — I10 Essential (primary) hypertension: Secondary | ICD-10-CM | POA: Insufficient documentation

## 2021-07-18 DIAGNOSIS — E119 Type 2 diabetes mellitus without complications: Secondary | ICD-10-CM | POA: Diagnosis not present

## 2021-07-18 DIAGNOSIS — R109 Unspecified abdominal pain: Secondary | ICD-10-CM | POA: Diagnosis present

## 2021-07-18 DIAGNOSIS — Z79899 Other long term (current) drug therapy: Secondary | ICD-10-CM | POA: Diagnosis not present

## 2021-07-18 DIAGNOSIS — R61 Generalized hyperhidrosis: Secondary | ICD-10-CM | POA: Diagnosis not present

## 2021-07-18 HISTORY — DX: Acute pancreatitis without necrosis or infection, unspecified: K85.90

## 2021-07-18 LAB — HEPATIC FUNCTION PANEL
ALT: 59 U/L — ABNORMAL HIGH (ref 0–44)
AST: 29 U/L (ref 15–41)
Albumin: 4.8 g/dL (ref 3.5–5.0)
Alkaline Phosphatase: 73 U/L (ref 38–126)
Bilirubin, Direct: 0.2 mg/dL (ref 0.0–0.2)
Indirect Bilirubin: 0.7 mg/dL (ref 0.3–0.9)
Total Bilirubin: 0.9 mg/dL (ref 0.3–1.2)
Total Protein: 7.5 g/dL (ref 6.5–8.1)

## 2021-07-18 LAB — URINALYSIS, ROUTINE W REFLEX MICROSCOPIC
Bilirubin Urine: NEGATIVE
Glucose, UA: >1000 mg/dL — AB
Ketones, ur: 15 mg/dL — AB
Leukocytes,Ua: NEGATIVE
Nitrite: NEGATIVE
RBC / HPF: >50 RBC/hpf — ABNORMAL HIGH (ref 0–5)
Specific Gravity, Urine: 1.024 (ref 1.005–1.030)
pH: 5.5 (ref 5.0–8.0)

## 2021-07-18 LAB — BASIC METABOLIC PANEL WITH GFR
Anion gap: 19 — ABNORMAL HIGH (ref 5–15)
BUN: 10 mg/dL (ref 6–20)
CO2: 21 mmol/L — ABNORMAL LOW (ref 22–32)
Calcium: 9.7 mg/dL (ref 8.9–10.3)
Chloride: 101 mmol/L (ref 98–111)
Creatinine, Ser: 1.22 mg/dL (ref 0.61–1.24)
GFR, Estimated: >60 mL/min (ref >60)
Glucose, Bld: 306 mg/dL — ABNORMAL HIGH (ref 70–99)
Potassium: 4.2 mmol/L (ref 3.5–5.1)
Sodium: 141 mmol/L (ref 135–145)

## 2021-07-18 LAB — CBC
HCT: 45.1 % (ref 39.0–52.0)
Hemoglobin: 15.8 g/dL (ref 13.0–17.0)
MCH: 29.5 pg (ref 26.0–34.0)
MCHC: 35 g/dL (ref 30.0–36.0)
MCV: 84.3 fL (ref 80.0–100.0)
Platelets: 240 K/uL (ref 150–400)
RBC: 5.35 MIL/uL (ref 4.22–5.81)
RDW: 12.2 % (ref 11.5–15.5)
WBC: 10.1 K/uL (ref 4.0–10.5)
nRBC: 0 % (ref 0.0–0.2)

## 2021-07-18 LAB — LIPASE, BLOOD: Lipase: 20 U/L (ref 11–51)

## 2021-07-18 MED ORDER — ONDANSETRON HCL 4 MG/2ML IJ SOLN
4.0000 mg | Freq: Once | INTRAMUSCULAR | Status: AC
  Administered 2021-07-18: 4 mg via INTRAVENOUS
  Filled 2021-07-18: qty 2

## 2021-07-18 MED ORDER — DIPHENHYDRAMINE HCL 50 MG/ML IJ SOLN
12.5000 mg | Freq: Once | INTRAMUSCULAR | Status: AC
  Administered 2021-07-18: 12.5 mg via INTRAVENOUS
  Filled 2021-07-18: qty 1

## 2021-07-18 MED ORDER — TRAMADOL HCL 50 MG PO TABS: 50.0000 mg | ORAL_TABLET | Freq: Three times a day (TID) | ORAL | 0 refills | Status: DC | PRN

## 2021-07-18 MED ORDER — SODIUM CHLORIDE 0.9 % IV BOLUS
1000.0000 mL | Freq: Once | INTRAVENOUS | Status: AC
  Administered 2021-07-18: 1000 mL via INTRAVENOUS

## 2021-07-18 MED ORDER — KETOROLAC TROMETHAMINE 15 MG/ML IJ SOLN
15.0000 mg | Freq: Once | INTRAMUSCULAR | Status: AC
  Administered 2021-07-18: 15 mg via INTRAVENOUS
  Filled 2021-07-18: qty 1

## 2021-07-18 MED ORDER — MORPHINE SULFATE (PF) 4 MG/ML IV SOLN
4.0000 mg | Freq: Once | INTRAVENOUS | Status: AC
  Administered 2021-07-18: 4 mg via INTRAVENOUS
  Filled 2021-07-18: qty 1

## 2021-07-18 MED ORDER — TAMSULOSIN HCL 0.4 MG PO CAPS: 0.4000 mg | ORAL_CAPSULE | Freq: Every day | ORAL | 0 refills | Status: DC

## 2021-07-18 MED ORDER — PROCHLORPERAZINE EDISYLATE 10 MG/2ML IJ SOLN
10.0000 mg | Freq: Once | INTRAMUSCULAR | Status: AC
  Administered 2021-07-18: 10 mg via INTRAVENOUS
  Filled 2021-07-18: qty 2

## 2021-07-18 NOTE — ED Provider Notes (Signed)
[facility] DEPT Provider Note   CSN: [id number] Arrival date & time: 07/18/21  0900     History  Chief Complaint  Patient presents with   Flank Pain    Jahki Witham is a 51 y.o. male who presents the emergency department complaining of left-sided flank pain.  Patient states that he has been having some pain for the past several days, but got significantly worse about an hour and a half ago.  He has a history of an obstructed kidney stone on the right side, requiring surgical intervention.  He is complaining of nausea, vomiting, abdominal pain, and sweating.  No fever.   Flank Pain Associated symptoms include abdominal pain. Pertinent negatives include no chest pain.      Home Medications Prior to Admission medications   Medication Sig Start Date End Date Taking? Authorizing Provider  tamsulosin (FLOMAX) 0.4 MG CAPS capsule Take 1 capsule (0.4 mg total) by mouth daily. Once daily until stone passes 07/18/21  Yes Shervon Kerwin T, PA-C  traMADol (ULTRAM) 50 MG tablet Take 1 tablet (50 mg total) by mouth every 8 (eight) hours as needed for severe pain. 07/18/21  Yes Amairany Schumpert T, PA-C  acetaminophen (TYLENOL) 500 MG tablet Take 1-2 tablets (500-1,000 mg total) by mouth every 8 (eight) hours as needed for mild pain. 06/03/20   Mariel Aloe, MD  atorvastatin (LIPITOR) 20 MG tablet Take 20 mg by mouth daily. 05/08/21   [provider]  glimepiride (AMARYL) 2 MG tablet Take 4 mg by mouth daily. 05/30/19   [provider]  metFORMIN (GLUCOPHAGE-XR) 500 MG 24 hr tablet Take 1,000 mg by mouth in the morning and at bedtime.    [provider]  metoprolol succinate (TOPROL-XL) 50 MG 24 hr tablet Take 50 mg by mouth daily.    [provider]  Multiple Vitamin (MULTIVITAMIN ADULT PO) Take 1 tablet by mouth daily.    [provider]  nitroGLYCERIN (NITROSTAT) 0.4 MG SL tablet Place 1 tablet (0.4 mg total) under the  tongue every 5 (five) minutes as needed for chest pain. Max 3 doses in 15 minutes 07/24/19 05/24/21  Lorretta Harp, MD      Allergies    Patient has no known allergies.    Review of Systems   Review of Systems  Constitutional:  Positive for diaphoresis. Negative for fever.  Cardiovascular:  Negative for chest pain.  Gastrointestinal:  Positive for abdominal pain, nausea and vomiting. Negative for constipation and diarrhea.  Genitourinary:  Positive for flank pain.  All other systems reviewed and are negative.  Physical Exam Updated Vital Signs BP (!) 167/87   Pulse (!) 47   Temp 98 F (36.7 C) (Oral)   Resp (!) 22   Ht '5\' 8"'$  (1.727 m)   Wt 83.9 kg   SpO2 100%   BMI 28.13 kg/m  Physical Exam Vitals and nursing note reviewed.  Constitutional:      Appearance: Normal appearance. He is diaphoretic.  HENT:     Head: Normocephalic and atraumatic.  Eyes:     Conjunctiva/sclera: Conjunctivae normal.  Cardiovascular:     Rate and Rhythm: Normal rate and regular rhythm.  Pulmonary:     Effort: Pulmonary effort is normal. No respiratory distress.     Breath sounds: Normal breath sounds.  Abdominal:     General: There is no distension.     Palpations: Abdomen is soft.     Tenderness: There is abdominal tenderness in the  left lower quadrant. There is left CVA tenderness.  Skin:    General: Skin is warm.  Neurological:     General: No focal deficit present.     Mental Status: He is alert.    ED Results / Procedures / Treatments   Labs (all labs ordered are listed, but only abnormal results are displayed) Labs Reviewed  URINALYSIS, ROUTINE W REFLEX MICROSCOPIC - Abnormal; Notable for the following components:      Result Value   Glucose, UA >1,000 (*)    Hgb urine dipstick LARGE (*)    Ketones, ur 15 (*)    Protein, ur TRACE (*)    RBC / HPF >50 (*)    Bacteria, UA FEW (*)    All other components within normal limits  BASIC METABOLIC PANEL - Abnormal; Notable for the  following components:   CO2 21 (*)    Glucose, Bld 306 (*)    Anion gap 19 (*)    All other components within normal limits  HEPATIC FUNCTION PANEL - Abnormal; Notable for the following components:   ALT 59 (*)    All other components within normal limits  CBC  LIPASE, BLOOD    EKG None  Radiology CT Renal Stone Study  Result Date: 07/18/2021 CLINICAL DATA:  Flank pain, kidney stone suspected EXAM: CT ABDOMEN AND PELVIS WITHOUT CONTRAST TECHNIQUE: Multidetector CT imaging of the abdomen and pelvis was performed following the standard protocol without IV contrast. RADIATION DOSE REDUCTION: This exam was performed according to the departmental dose-optimization program which includes automated exposure control, adjustment of the mA and/or kV according to patient size and/or use of iterative reconstruction technique. COMPARISON:  None Available. FINDINGS: Lower chest: No acute abnormality. There is an unchanged, 5 mm right basilar pulmonary nodule (series 4, image 14), see prior chest CT 11/04/2020 for management recommendations. Prominent left posterior extrapleural fat. Hepatobiliary: No focal liver abnormality is seen. The gallbladder is unremarkable. Pancreas: Unremarkable. No pancreatic ductal dilatation or surrounding inflammatory changes. Spleen: Normal in size without focal abnormality. Adrenals/Urinary Tract: Adrenal glands are unremarkable. There is a 2 mm stone at the left ureterovesicular junction, with mild hydroureteronephrosis and periureteral stranding. Resolved right-sided urinary obstruction. The bladder is mildly distended. Stomach/Bowel: The stomach is within normal limits. There is no evidence of bowel obstruction.The appendix is normal. Vascular/Lymphatic: No significant vascular findings are present. No enlarged abdominal or pelvic lymph nodes. Reproductive: Unremarkable. Other: Resolved right-sided retroperitoneal fluid. No free air. No ascites. No hernia. Musculoskeletal: No  acute osseous abnormality. No suspicious lytic or blastic lesions. Multilevel degenerative changes of the spine, worst at L4-L5 and L5-S1. Mild bilateral hip arthritis. IMPRESSION: 2 mm stone at the left ureterovesicular junction with mild upstream hydroureteronephrosis and periureteral stranding. Resolved right-sided urinary obstruction. Unchanged 5 mm right basilar pulmonary nodule. Recommend continued follow-up as documented on prior chest CT 11/04/2020. Electronically Signed   By: Maurine Simmering M.D.   On: 07/18/2021 10:49    Procedures Procedures    Medications Ordered in ED Medications  sodium chloride 0.9 % bolus 1,000 mL (0 mLs Intravenous Stopped 07/18/21 1034)  ondansetron (ZOFRAN) injection 4 mg (4 mg Intravenous Given 07/18/21 0937)  morphine (PF) 4 MG/ML injection 4 mg (4 mg Intravenous Given 07/18/21 0937)  prochlorperazine (COMPAZINE) injection 10 mg (10 mg Intravenous Given 07/18/21 1058)  diphenhydrAMINE (BENADRYL) injection 12.5 mg (12.5 mg Intravenous Given 07/18/21 1058)  ketorolac (TORADOL) 15 MG/ML injection 15 mg (15 mg Intravenous Given 07/18/21 1114)  ED Course/ Medical Decision Making/ A&P                           Medical Decision Making Amount and/or Complexity of Data Reviewed Labs: ordered. Radiology: ordered.  Risk Prescription drug management.   This patient is a 51 y.o. male who presents to the ED for concern of flank pain, this involves an extensive number of treatment options, and is a complaint that carries with it a high risk of complications and morbidity. The emergent differential diagnosis prior to evaluation includes, but is not limited to, AAA, Renal artery/vein embolism/thrombosis, Mesenteric ischemia, Pyelonephritis, Nephrolithiasis, Cystitis, Biliary colic, Pancreatitis, Perforated peptic ulcer, Appendicitis, Diverticulitis, Bowel obstruction. This is not an exhaustive differential.   Past Medical History / Co-morbidities / Social  History: Diabetes, HTN, ankylosing spondylitis, HLD, pancreatitis  Additional history: Chart reviewed. Pertinent results include: Pt admitted in April 2023 due to cluster of small obstructing stones at right ureterovesicular junction, required lithotripsy ureteral stent placement.  PDMP reviewed.   Physical Exam: Physical exam performed. The pertinent findings include: Pt is diaphoretic. Afebrile. Persistently bradycardic, which appears to be his baseline per chart review. Left CVA tenderness and LLQ tenderness to palpation.   Lab Tests: I ordered, and personally interpreted labs.  The pertinent results include:  No leukocytosis. Normal hemoglobin. Glucose 306. Urinalysis with >1,000 glucose, large hemoglobin, negative leukocytes and nitrites. Mildly elevated ALT at 59, normal lipase.    Imaging Studies: I ordered imaging studies including CT renal study. I independently visualized and interpreted imaging which showed 2 mm stone at left ureterovesicular junction. I agree with the radiologist interpretation.   Medications: I ordered medication including IV fluids, morphine and antiemetics  for abdominal pain and nausea. Reevaluation of the patient after these medicines showed that the patient improved. I have reviewed the patients home medicines and have made adjustments as needed.  Disposition: After consideration of the diagnostic results and the patients response to treatment, I feel that patient's not requiring admission or inpatient treatment for his symptoms.  With a nonobstructing 2 mm stone, patient should be able to pass this at home.  Will discharge to home with prescription for pain medication and tamsulosin.  Wife reports that patient had better luck with tramadol for analgesia in the past, and this is consistent with his medication history.  We will prescribe this.  Encouraged follow-up with patient's urologist.  Discussed reasons to return to the emergency department, and patient is  agreeable to the plan.  I discussed this case with my attending physician Dr. Gilford Raid who cosigned this note including patient's presenting symptoms, physical exam, and planned diagnostics and interventions. Attending physician stated agreement with plan or made changes to plan which were implemented.     Final Clinical Impression(s) / ED Diagnoses Final diagnoses:  Left ureteral stone    Rx / DC Orders ED Discharge Orders          Ordered    traMADol (ULTRAM) 50 MG tablet  Every 8 hours PRN        07/18/21 1117    tamsulosin (FLOMAX) 0.4 MG CAPS capsule  Daily        07/18/21 1117           Portions of this report may have been transcribed using voice recognition software. Every effort was made to ensure accuracy; however, inadvertent computerized transcription errors may be present.    Naryiah Schley T, PA-C 07/18/21 1119  Isla Pence, MD 07/18/21 1505

## 2021-07-18 NOTE — ED Notes (Signed)
Discharge instructions, follow up care, and prescriptions reviewed and explained, pt verbalized understanding.  

## 2021-07-18 NOTE — Discharge Instructions (Addendum)
You were seen in the emergency department for flank pain.  You have a 2 mm kidney stone on the left side, it is not obstructing. We will give you pain medications and tamsulosin, which should help you pass the stone.   Follow up with the urologist when able.   Continue to monitor how you're doing and return to the ER for new or worsening symptoms such as fevers, worsening pain despite medication, or inability to urinate.

## 2021-07-18 NOTE — ED Triage Notes (Signed)
Left side flank pain onset 1 1/2 hours ago.  History of obstruction kidney stone.  Present nausea vomiting pain and diaphoric

## 2021-11-22 ENCOUNTER — Ambulatory Visit (INDEPENDENT_AMBULATORY_CARE_PROVIDER_SITE_OTHER): Payer: BC Managed Care – PPO | Admitting: Orthopaedic Surgery

## 2021-11-22 ENCOUNTER — Ambulatory Visit (INDEPENDENT_AMBULATORY_CARE_PROVIDER_SITE_OTHER): Payer: BC Managed Care – PPO

## 2021-11-22 DIAGNOSIS — M25521 Pain in right elbow: Secondary | ICD-10-CM

## 2021-11-22 DIAGNOSIS — G8929 Other chronic pain: Secondary | ICD-10-CM

## 2021-11-22 DIAGNOSIS — M5412 Radiculopathy, cervical region: Secondary | ICD-10-CM | POA: Diagnosis not present

## 2021-11-22 DIAGNOSIS — M25511 Pain in right shoulder: Secondary | ICD-10-CM | POA: Diagnosis not present

## 2021-11-22 MED ORDER — TRIAMCINOLONE ACETONIDE 40 MG/ML IJ SUSP
80.0000 mg | INTRAMUSCULAR | Status: AC | PRN
  Administered 2021-11-22: 80 mg via INTRA_ARTICULAR

## 2021-11-22 MED ORDER — LIDOCAINE HCL 1 % IJ SOLN
4.0000 mL | INTRAMUSCULAR | Status: AC | PRN
  Administered 2021-11-22: 4 mL

## 2021-11-22 NOTE — Progress Notes (Signed)
Chief Complaint: Right shoulder, right elbow pain, right neck pain     History of Present Illness:    Jonathan Melendez is a 51 y.o. male "Jonathan Melendez" presents today for follow-up of the above issues.  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  He has previously been seen at [facility] which they performed an MRI of the cervical spine.  He never had any intervention on this.  He has had multiple injections in his shoulders.  He states that he did not have an MRI of this.  He does continue to have pain with overhead activity in the right shoulder.  He denies any weakness.  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  He previously was in the [employer].  He does enjoy being quite active.  He is experiencing numbness in the thumb index finger and middle finger as well.  His right elbow has been painful and swollen for the last several weeks.    Surgical History:   None  PMH/PSH/Family History/Social History/Meds/Allergies:    Past Medical History:  Diagnosis Date  . Ankylosing spondylitis ([place])   . Diabetes mellitus without complication ([place])   . Dyslipidemia   . Hypertension   . Pancreatitis    Past Surgical History:  Procedure Laterality Date  . CYSTOSCOPY/URETEROSCOPY/HOLMIUM LASER/STENT PLACEMENT Right 05/25/2021   Procedure: CYSTOSCOPY/URETEROSCOPY/HOLMIUM LASER/STENT PLACEMENT;  Surgeon: Festus Aloe, MD;  Location: [facility] ORS;  Service: Urology;  Laterality: Right;  . TONSILLECTOMY    . VASECTOMY    . VASECTOMY REVERSAL     Social History   Socioeconomic History  . Marital status: Significant Other    Spouse name: Not on file  . Number of children: Not on file  . Years of education: Not on file  . Highest education level: Not on  file  Occupational History  . Occupation: Financial trader  Tobacco Use  . Smoking status: Never    Passive exposure: Never  . Smokeless tobacco: Never  Vaping Use  . Vaping Use: Never used  Substance and Sexual Activity  . Alcohol use: Not Currently    Comment: occasional  . Drug use: Never  . Sexual activity: Yes  Other Topics Concern  . Not on file  Social History Narrative  . Not on file   Social Determinants of Health   Financial Resource Strain: Not on file  Food Insecurity: Not on file  Transportation Needs: Not on file  Physical Activity: Not on file  Stress: Not on file  Social Connections: Not on file   Family History  Problem Relation Age of Onset  . Lung cancer Father   . Dementia Father   . Autoimmune disease Brother        x2  . Lung cancer Paternal Grandfather   . Dementia Paternal Grandfather    No Known Allergies Current Outpatient Medications  Medication Sig Dispense Refill  . acetaminophen (TYLENOL) 500 MG tablet Take 1-2 tablets (500-1,000 mg total) by mouth every 8 (eight) hours as needed for mild pain.    Marland Kitchen atorvastatin (LIPITOR) 20 MG tablet Take 20 mg  by mouth daily.    Marland Kitchen glimepiride (AMARYL) 2 MG tablet Take 4 mg by mouth daily.    . metFORMIN (GLUCOPHAGE-XR) 500 MG 24 hr tablet Take 1,000 mg by mouth in the morning and at bedtime.    . metoprolol succinate (TOPROL-XL) 50 MG 24 hr tablet Take 50 mg by mouth daily.    . Multiple Vitamin (MULTIVITAMIN ADULT PO) Take 1 tablet by mouth daily.    . nitroGLYCERIN (NITROSTAT) 0.4 MG SL tablet Place 1 tablet (0.4 mg total) under the tongue every 5 (five) minutes as needed for chest pain. Max 3 doses in 15 minutes 25 tablet 3  . tamsulosin (FLOMAX) 0.4 MG CAPS capsule Take 1 capsule (0.4 mg total) by mouth daily. Once daily until stone passes 30 capsule 0  . traMADol (ULTRAM) 50 MG tablet Take 1 tablet (50 mg total) by mouth every 8 (eight) hours as needed for severe pain. 15 tablet 0    No current facility-administered medications for this visit.   No results found.  Review of Systems:   A ROS was performed including pertinent positives and negatives as documented in the HPI.  Physical Exam :   Constitutional: NAD and appears stated age Neurological: Alert and oriented Psych: Appropriate affect and cooperative There were no vitals taken for this visit.   Comprehensive Musculoskeletal Exam:    She has tenderness to palpation about the lateral deltoid.  He has full forward flexion of bilateral shoulders to 170 degrees, external rotation at the side is to 45 degrees, internal rotation is to L1 bilaterally.  He has tenderness about the upper trapezius and a positive Spurling maneuver.  There is tenderness with flexion extension of the right elbow although this is full in terms of flexion extension and pro supination.  Imaging:   Xray (3 views right shoulder): Mild AC joint arthritis   I personally reviewed and interpreted the radiographs.   Assessment:   51 y.o. male with multiple sites of pain.  He does have a positive Spurling maneuver with pain radiating down the arm.  This has been persisting for the last 25 years.  He has previously underwent 6 months of physical therapy for his neck and shoulder.  This did not provide any type of lasting relief.  I did describe that there is a large differential for his neck and shoulder.  Specifically I do believe it is possible that he has a cervical disc herniation.  His previous symptoms of the right shoulder are consistent with possible frozen shoulder although he continues to have pain and weakness following the thawing phase.  He has done 6 months of physical therapy on the right shoulder with limited relief.  That effect I do believe that an MRI of his neck and shoulder are both needed in order to help clarify source of pain.  With regard to the right elbow I do believe this is an inflammatory type pain as a result of his  ankylosing spondylitis.  I will plan to right elbow ultrasound-guided injection today  Plan :    -Right elbow ultrasound-guided injection performed after verbal consent    Procedure Note  Patient: [name]             Date of Birth: [birth year]           MRN: [id number]             Visit Date: 11/22/2021  Procedures: Visit Diagnoses:  1. Chronic right shoulder pain   2.  Radiculopathy, cervical region     Medium Joint Inj: R elbow on 11/22/2021 1:37 PM Indications: pain Details: 22 G 1.5 in needle, ultrasound-guided lateral approach Medications: 4 mL lidocaine 1 %; 80 mg triamcinolone acetonide 40 MG/ML Outcome: tolerated well, no immediate complications Consent was given by the patient. Immediately prior to procedure a time out was called to verify the correct patient, procedure, equipment, support staff and site/side marked as required. Patient was prepped and draped in the usual sterile fashion.          I personally saw and evaluated the patient, and participated in the management and treatment plan.  Vanetta Mulders, MD Attending Physician, Orthopedic Surgery  This document was dictated using Dragon voice recognition software. A reasonable attempt at proof reading has been made to minimize errors.

## 2021-12-03 ENCOUNTER — Ambulatory Visit
Admission: RE | Admit: 2021-12-03 | Discharge: 2021-12-03 | Disposition: A | Discharge status: Home / Self Care | Payer: BC Managed Care – PPO | Source: Ambulatory Visit | Attending: Orthopaedic Surgery | Admitting: Orthopaedic Surgery

## 2021-12-03 DIAGNOSIS — M4802 Spinal stenosis, cervical region: Secondary | ICD-10-CM | POA: Diagnosis not present

## 2021-12-03 DIAGNOSIS — M5412 Radiculopathy, cervical region: Secondary | ICD-10-CM

## 2021-12-03 DIAGNOSIS — M47812 Spondylosis without myelopathy or radiculopathy, cervical region: Secondary | ICD-10-CM | POA: Diagnosis not present

## 2021-12-06 ENCOUNTER — Encounter (HOSPITAL_BASED_OUTPATIENT_CLINIC_OR_DEPARTMENT_OTHER): Payer: Self-pay | Admitting: Orthopaedic Surgery

## 2021-12-15 ENCOUNTER — Ambulatory Visit (INDEPENDENT_AMBULATORY_CARE_PROVIDER_SITE_OTHER): Payer: BC Managed Care – PPO | Admitting: Orthopaedic Surgery

## 2021-12-15 DIAGNOSIS — M5412 Radiculopathy, cervical region: Secondary | ICD-10-CM

## 2021-12-15 NOTE — Progress Notes (Signed)
Chief Complaint: Right shoulder, right elbow pain, right neck pain     History of Present Illness:   12/15/2021: Presents today for follow-up.  He continues to have bilateral upper arm pain numbness down both legs.  This is worse on the right compared to the left.  This has been progressive and increasingly painful particular work-up.  Jonathan Melendez is a 51 y.o. male "Jonathan Melendez" presents today for follow-up of the above issues.  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  He has previously been seen at [facility] which they performed an MRI of the cervical spine.  He never had any intervention on this.  He has had multiple injections in his shoulders.  He states that he did not have an MRI of this.  He does continue to have pain with overhead activity in the right shoulder.  He denies any weakness.  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  He previously was in the [employer]  He does enjoy being quite active.  He is experiencing numbness in the thumb index finger and middle finger as well.  His right elbow has been painful and swollen for the last several weeks.    Surgical History:   None  PMH/PSH/Family History/Social History/Meds/Allergies:    Past Medical History:  Diagnosis Date   Ankylosing spondylitis ([place])    Diabetes mellitus without complication ([place])    Dyslipidemia    Hypertension    Pancreatitis    Past Surgical History:  Procedure Laterality Date   CYSTOSCOPY/URETEROSCOPY/HOLMIUM LASER/STENT PLACEMENT Right 05/25/2021   Procedure: CYSTOSCOPY/URETEROSCOPY/HOLMIUM LASER/STENT PLACEMENT;  Surgeon: Festus Aloe, MD;  Location: [facility];  Service: Urology;  Laterality: Right;   TONSILLECTOMY     VASECTOMY     VASECTOMY  REVERSAL     Social History   Socioeconomic History   Marital status: Significant Other    Spouse name: Not on file   Number of children: Not on file   Years of education: Not on file   Highest education level: Not on file  Occupational History   Occupation: Financial trader  Tobacco Use   Smoking status: Never    Passive exposure: Never   Smokeless tobacco: Never  Vaping Use   Vaping Use: Never used  Substance and Sexual Activity   Alcohol use: Not Currently    Comment: occasional   Drug use: Never   Sexual activity: Yes  Other Topics Concern   Not on file  Social History Narrative   Not on file   Social Determinants of Health   Financial Resource Strain: Not on file  Food Insecurity: Not on file  Transportation Needs: Not on file  Physical Activity: Not on file  Stress: Not on file  Social Connections: Not on file   Family History  Problem Relation Age of Onset   Lung cancer Father    Dementia Father    Autoimmune disease Brother        x2   Lung cancer Paternal Grandfather    Dementia Paternal Grandfather    No Known Allergies Current Outpatient Medications  Medication  Sig Dispense Refill   acetaminophen (TYLENOL) 500 MG tablet Take 1-2 tablets (500-1,000 mg total) by mouth every 8 (eight) hours as needed for mild pain.     atorvastatin (LIPITOR) 20 MG tablet Take 20 mg by mouth daily.     glimepiride (AMARYL) 2 MG tablet Take 4 mg by mouth daily.     metFORMIN (GLUCOPHAGE-XR) 500 MG 24 hr tablet Take 1,000 mg by mouth in the morning and at bedtime.     metoprolol succinate (TOPROL-XL) 50 MG 24 hr tablet Take 50 mg by mouth daily.     Multiple Vitamin (MULTIVITAMIN ADULT PO) Take 1 tablet by mouth daily.     nitroGLYCERIN (NITROSTAT) 0.4 MG SL tablet Place 1 tablet (0.4 mg total) under the tongue every 5 (five) minutes as needed for chest pain. Max 3 doses in 15 minutes 25 tablet 3   tamsulosin (FLOMAX) 0.4 MG CAPS capsule Take 1 capsule (0.4  mg total) by mouth daily. Once daily until stone passes 30 capsule 0   traMADol (ULTRAM) 50 MG tablet Take 1 tablet (50 mg total) by mouth every 8 (eight) hours as needed for severe pain. 15 tablet 0   No current facility-administered medications for this visit.   No results found.  Review of Systems:   A ROS was performed including pertinent positives and negatives as documented in the HPI.  Physical Exam :   Constitutional: NAD and appears stated age Neurological: Alert and oriented Psych: Appropriate affect and cooperative There were no vitals taken for this visit.   Comprehensive Musculoskeletal Exam:    She has tenderness to palpation about the lateral deltoid.  He has full forward flexion of bilateral shoulders to 170 degrees, external rotation at the side is to 45 degrees, internal rotation is to L1 bilaterally.  He has tenderness about the upper trapezius and a positive Spurling maneuver.  There is tenderness with flexion extension of the right elbow although this is full in terms of flexion extension and pro supination.  Imaging:   Xray (3 views right shoulder): Mild AC joint arthritis  MRI cervical spine: Large C6-C7 disc herniation with right right worse than left stenosis  I personally reviewed and interpreted the radiographs.   Assessment:   51 y.o. male with right worse than left cervical stenosis particularly at the C6-C7 level.  At this time it is much more progressive and I do believe he would benefit from surgical consultation to see if he is a candidate for cervical disc arthroplasty.  We will plan to refer him to my partner Dr. Laurance Flatten for assessment of this.   Plan :    -Plan for referral to Dr. Laurance Flatten for assessment of his cervical stenosis and radiculopathy       I personally saw and evaluated the patient, and participated in the management and treatment plan.  Vanetta Mulders, MD Attending Physician, Orthopedic Surgery  This document was dictated  using Dragon voice recognition software. A reasonable attempt at proof reading has been made to minimize errors.

## 2021-12-18 ENCOUNTER — Ambulatory Visit (INDEPENDENT_AMBULATORY_CARE_PROVIDER_SITE_OTHER): Payer: BC Managed Care – PPO | Admitting: Orthopedic Surgery

## 2021-12-18 ENCOUNTER — Ambulatory Visit (INDEPENDENT_AMBULATORY_CARE_PROVIDER_SITE_OTHER): Payer: BC Managed Care – PPO

## 2021-12-18 DIAGNOSIS — M5412 Radiculopathy, cervical region: Secondary | ICD-10-CM

## 2021-12-18 MED ORDER — GABAPENTIN 300 MG PO CAPS: 300.0000 mg | ORAL_CAPSULE | Freq: Three times a day (TID) | ORAL | 0 refills | Status: DC

## 2021-12-18 NOTE — Progress Notes (Addendum)
Orthopedic Spine Surgery Office Note  Assessment: Patient is a 51 y.o. male with cervical myeloradiculopathy. Has central and foraminal stenosis at C5/6 and C6/7. Has foraminal stenosis at C4/5 but no symptoms in the left shoulder region.    Plan: -Explained that initially conservative treatment is tried as a significant number of patients may experience relief with these treatment modalities. Discussed that the conservative treatments include:  -activity modification  -physical therapy  -over the counter pain medications  -cervical steroid injections -Patient has tried all the above at some point in time but is pain persists so discussed operative management as an option -Gabapentin was prescribed for additional pain relief -We discussed a C5-7 ACDF with anterior plating, will need clearance pre-operatively -Patient will next be seen at the date of surgery   The patient has cervical radiculopathy, which was initially treated with non-operative measures. The patient's symptoms failed to improve with conservative treatments, so operative management was discussed in the form of C5-7 anterior cervical discectomy and fusion. The risks, including but not limited to pseudarthrosis, dysphagia, hematoma, airway compromise, recurrent laryngeal nerve injury, esophageal perforation, durotomy, spinal cord injury, nerve root injury, persistent pain, adjacent segment disease, infection, bleeding, hardware failure, and need for additional procedures were discussed with the patient. The benefit of surgery would be improvement in the patient's arm pain. Explained that the patient may not get full relief of their pain with this surgery, especially any neck pain. The alternatives to surgical management would be continued monitoring, physical therapy, over-the-counter pain medications, injections, traction, and activity modification. All the patient's questions were answered to their satisfaction. After this  discussion, the patient expressed understanding and elected to proceed with surgical intervention.     ___________________________________________________________________________   History:  Patient is a 51 y.o. male who presents today for cervical spine.  Patient has had several years of pain in his neck that radiates into his bilateral arms.  Feels it goes down the right arm on the lateral aspect into the thumb, index, and long fingers.  On the left side, he feels that from the elbow down radiating into his thumb, index, and long finger.  In the past, the pain has subsided with time.  This time the pain has been constant and is getting progressively worse.  He feels that both of his hands are going numb and he is having trouble doing fine motor skills due to the decreased sensation in his hands.   Weakness: Denies Difficulty with fine motor skills (e.g., buttoning shirts, handwriting): Yes Symptoms of imbalance: Denies Paresthesias and numbness: Yes in C6 and C7 distributions bilaterally Bowel or bladder incontinence: Denies Saddle anesthesia: Denies  Treatments tried: NSAIDs, Tylenol, steroid injections, oral steroids, activity modification  Review of systems: Denies fevers and chills, night sweats, unexplained weight loss, history of cancer.  Pain into his arms has awakened him at night in the past  Past medical history: Hyperlipidemia Hypertension Depression Diabetes Ankylosing spondylitis  Allergies: NKDA  Past surgical history:  Vasectomy Kidney stone removal  Social history: Denies use of nicotine product (smoking, vaping, patches, smokeless) Alcohol use: Yes, 1 to 2/month Denies recreational drug use  Physical Exam:  General: no acute distress, appears stated age Neurologic: alert, answering questions appropriately, following commands Respiratory: unlabored breathing on room air, symmetric chest rise Psychiatric: appropriate affect, normal cadence to  speech   MSK (spine):  -Strength exam      Left  Right Grip strength  5/5  5/5 Interosseus   5/5   5/5 Wrist extension  5/5  5/5 Wrist flexion   5/5  5/5 Elbow flexion   4/5  5/5 Deltoid    5/5  5/5  EHL    4/5  4/5 TA    5/5  5/5 GSC    5/5  5/5 Knee extension  5/5  5/5 Hip flexion   5/5  5/5  -Sensory exam    Sensation intact to light touch in L3-S1 nerve distributions of bilateral lower extremities  Sensation intact to light touch in C5-T1 nerve distributions of bilateral upper extremities  -Brachioradialis DTR: 3/4 on the left, 2/4 on the right -Biceps DTR: 2/4 on the left, 2/4 on the right -Triceps DTR: 2/4 on the left, 2/4 on the right -Achilles DTR: 2/4 on the left, 2/4 on the right -Patellar tendon DTR: 2/4 on the left, 2/4 on the right  -Spurling: Negative bilaterally -Hoffman sign: Negative bilaterally -Clonus: No beats bilaterally -Interosseous wasting: None seen -Grip and release test: Positive -Romberg: Negative -Gait: Normal -Imbalance with tandem gait: No  Left shoulder exam: No pain through range of motion, negative Jobe, negative belly press, no weakness with external rotation with arm at side Right shoulder exam: Pain at extreme of external rotation otherwise no pain through range of motion, negative Jobe, negative belly press, no weakness with external rotation with arm at side  Tinel's at wrist: Negative bilaterally Phalen's at wrist: Negative bilaterally Durkan's: Negative bilaterally  Tinel's at elbow: Negative bilaterally Phalen's at elbow: Negative bilaterally  Imaging: XR of the cervical spine from 12/18/2021 was independently reviewed and interpreted, showing disc height loss at C5/6 and C6/7. Anterior osteophyte formation. No fracture or dislocation.   MRI of the cervical spine from 12/03/2021 was independently reviewed and interpreted, showing central stenosis at C5/6 and C6/7. Foraminal stenosis at C6/7 (worse on the  right), C5/6 (worse on the right), C4/5 on the left.    Patient name: Jonathan Melendez prefers Jonathan Melendez Patient MRN: [id number] Date of visit: 12/18/21

## 2021-12-20 ENCOUNTER — Ambulatory Visit (HOSPITAL_BASED_OUTPATIENT_CLINIC_OR_DEPARTMENT_OTHER): Payer: BC Managed Care – PPO | Admitting: Orthopaedic Surgery

## 2021-12-25 ENCOUNTER — Encounter (INDEPENDENT_AMBULATORY_CARE_PROVIDER_SITE_OTHER): Payer: Self-pay

## 2021-12-25 DIAGNOSIS — E119 Type 2 diabetes mellitus without complications: Secondary | ICD-10-CM | POA: Diagnosis not present

## 2021-12-26 ENCOUNTER — Encounter: Payer: Self-pay | Admitting: Orthopedic Surgery

## 2021-12-26 DIAGNOSIS — M5412 Radiculopathy, cervical region: Secondary | ICD-10-CM

## 2022-01-03 MED ORDER — PREGABALIN 50 MG PO CAPS: 50.0000 mg | ORAL_CAPSULE | Freq: Three times a day (TID) | ORAL | 0 refills | Status: DC

## 2022-01-03 NOTE — Therapy (Signed)
OUTPATIENT PHYSICAL THERAPY CERVICAL EVALUATION   Patient Name: Jonathan Melendez MRN: [id number] DOB:1970/02/26, 51 y.o., male Today's Date: 01/05/2022   PT End of Session - 01/05/22 1618     Visit Number 1    Number of Visits 8    Date for PT Re-Evaluation 02/02/22    Authorization - Number of Visits 75    PT Start Time 1430    PT Stop Time 1505    PT Time Calculation (min) 35 min    Activity Tolerance Patient limited by pain;Patient tolerated treatment well    Behavior During Therapy [facility] for tasks assessed/performed             Past Medical History:  Diagnosis Date   Ankylosing spondylitis ([place])    Diabetes mellitus without complication ([place])    Dyslipidemia    Hypertension    Pancreatitis    Past Surgical History:  Procedure Laterality Date   CYSTOSCOPY/URETEROSCOPY/HOLMIUM LASER/STENT PLACEMENT Right 05/25/2021   Procedure: CYSTOSCOPY/URETEROSCOPY/HOLMIUM LASER/STENT PLACEMENT;  Surgeon: Festus Aloe, MD;  Location: [facility];  Service: Urology;  Laterality: Right;   TONSILLECTOMY     VASECTOMY     VASECTOMY REVERSAL     Patient Active Problem List   Diagnosis Date Noted   Nephrolithiasis 05/25/2021   Hydronephrosis of right kidney 05/24/2021   Overweight (BMI 25.0-29.9) 05/24/2021   Type 2 diabetes mellitus with hyperglycemia ([place]) 05/24/2021   Pulmonary nodule 05/24/2021   Sinus bradycardia 05/24/2021   Urolithiasis 05/24/2021   Acute pancreatitis 06/03/2020   Abdominal pain 06/01/2020   Hypertension    Dyslipidemia    Diabetes mellitus without complication ([place])    Ankylosing spondylitis ([place])    Essential hypertension 07/24/2019   H/O non-insulin dependent diabetes mellitus 07/24/2019   Hyperlipidemia 07/24/2019   Chest pain of uncertain etiology 38/11/1749    PCP: Percell Belt, DO  REFERRING PROVIDER: Callie Fielding, MD  REFERRING DIAG:  Diagnosis  M54.12 (ICD-10-CM) - Radiculopathy, cervical region    THERAPY DIAG:  Abnormal  posture - Plan: PT plan of care cert/re-cert  Muscle weakness (generalized) - Plan: PT plan of care cert/re-cert  Radiculopathy, cervical region - Plan: PT plan of care cert/re-cert  Rationale for Evaluation and Treatment: Rehabilitation  ONSET DATE: Chronic  SUBJECTIVE:                                                                                                                                                                                                         SUBJECTIVE STATEMENT: Jonathan Melendez has a multi-year history of neck and bilateral  upper extremity paresthesias.  Symptoms have been worsening over the past few months to the point that he is out of work and expecting surgery.  Right UE symptoms are C6-7 and left UE symptoms more C6.  His right sided symptoms are constant while his left sided symptoms are intermittent.  He also has neck and scapular pain.  PERTINENT HISTORY:   Ankylosing spondylitis, diabetes mellitus and hypertension  PAIN:  Are you having pain? Yes: NPRS scale: 4-7/10 Pain location: Neck, scapulae, Rt > Lt UE Pain description: Sharp, spasm, ache, tingling and shooting Aggravating factors: Constant symptoms but prolonged postures are worse Relieving factors: Medicine and ice help  PRECAUTIONS: Other: Postural  WEIGHT BEARING RESTRICTIONS: No  FALLS:  Has patient fallen in last 6 months? No  LIVING ENVIRONMENT: Lives with: lives with their family and mom, girlfriend and her daughter Lives in: House/apartment Stairs:  some problems Has following equipment at home:  Handrails on the stairs  OCCUPATION: Dance movement psychotherapist  PLOF: Independent  PATIENT GOALS: Jonathan Melendez would like to be able to return to work and his normal activities without neck, scapular or bilateral upper extremity symptoms.  He knows he is likely a surgical candidate, but would like some pain relief until his A1C gets under control so he can have the procedure.  NEXT MD VISIT: Monday  November 20  OBJECTIVE:   DIAGNOSTIC FINDINGS:  IMPRESSION: 1. Broad-based right paracentral disc osteophyte complex at C6-7 with secondary flattening of the right hemi cord, with resultant moderate spinal stenosis, with severe right and moderate left C7 foraminal narrowing. This is suspected to be the symptomatic level. 2. Degenerative spondylosis at C5-6 with resultant moderate spinal stenosis, with moderate right worse than left C6 foraminal narrowing. 3. Severe left C5 foraminal stenosis related to uncovertebral and facet disease.    PATIENT SURVEYS:  FOTO 50 (Goal 64 in 11 visits)  COGNITION: Overall cognitive status: Within functional limits for tasks assessed  SENSATION: Significant for constant Rt UE pain and paresthesias and intermittent Lt upper extremity pain and paresthesias, particularly of the 1st-3rd digit bilaterally  POSTURE: rounded shoulders, forward head, and decreased lumbar lordosis     CERVICAL ROM:   Active ROM A/PROM (deg) 01/05/2022  Flexion   Extension 35  Right lateral flexion 15  Left lateral flexion 30  Right rotation 15  Left rotation 45   (Blank rows = not tested)  UPPER EXTREMITY ROM:  Active ROM Right eval Left eval  Shoulder flexion    Shoulder extension    Shoulder abduction    Shoulder adduction    Shoulder extension    Shoulder internal rotation    Shoulder external rotation    Elbow flexion    Elbow extension    Wrist flexion    Wrist extension    Wrist ulnar deviation    Wrist radial deviation    Wrist pronation    Wrist supination     (Blank rows = not tested)  STRENGTH:  In pounds assessed by hand-held dynamometer Right 01/05/2022 Left 01/05/2022  Shoulder flexion    Shoulder extension    Shoulder abduction    Shoulder adduction    Shoulder extension    Shoulder internal rotation    Shoulder external rotation    Middle trapezius    Lower trapezius    Elbow flexion    Elbow extension    Wrist  flexion    Wrist extension    Wrist ulnar deviation    Wrist radial deviation  Wrist pronation    Cervical lateral bending 0.0 pounds 4.9 pounds  Cervical extension 3.1 pounds    (Blank rows = not tested)   TODAY'S TREATMENT:                                                                                                                              DATE: 01/05/2022 Shoulder blade pinches 10X 5 seconds Cervical extension Isometrics 10X 5 seconds Reviewed exam findings, HEP   PATIENT EDUCATION:  Education details: See above Person educated: Patient Education method: Explanation, Demonstration, Tactile cues, Verbal cues, and Handouts Education comprehension: verbalized understanding, returned demonstration, verbal cues required, tactile cues required, and needs further education  HOME EXERCISE PROGRAM: Access Code: [email or web address] URL: [email or web address] Date: 01/05/2022 Prepared by: Vista Mink  Exercises - Standing Isometric Cervical Extension with Manual Resistance  - 5 x daily - 7 x weekly - 1 sets - 5 reps - 5 hold - Standing Scapular Retraction  - 3-5 x daily - 7 x weekly - 1 sets - 5 reps - 5 second hold  ASSESSMENT:  CLINICAL IMPRESSION: Patient is a 51 y.o. male who was seen today for physical therapy evaluation and treatment for cervical radiculopathy.  Jonathan Melendez has a long history of cervical, scapular and bilateral upper extremity symptoms.  He knows he is a surgical candidate and is seeking education, exercise and cervical traction for symptomatic relief until he can get his A1C under control in order to have surgery with Dr. Laurance Flatten.  OBJECTIVE IMPAIRMENTS: decreased activity tolerance, decreased endurance, decreased knowledge of condition, decreased ROM, decreased strength, decreased safety awareness, impaired perceived functional ability, increased muscle spasms, impaired UE functional use, improper body mechanics, postural dysfunction, and pain.   ACTIVITY  LIMITATIONS: carrying, lifting, bending, sitting, standing, sleeping, stairs, and reach over head  PARTICIPATION LIMITATIONS: community activity and occupation  PERSONAL FACTORS: Time since onset of injury/illness/exacerbation are also affecting patient's functional outcome.   REHAB POTENTIAL: [place] We will start cervical traction at his next visit along with a review of his home exercises.  Reassessment will be completed in 3 to 4 weeks to determine if further rehabilitation is indicated or whether he is to be referred back to Dr. Laurance Flatten for surgery or additional medical management.  Jonathan Melendez and I also discussed that he is perfectly appropriate to proceed directly to surgery if his A1C is under control and he can get in to see Dr. Laurance Flatten sooner.  CLINICAL DECISION MAKING: Stable/uncomplicated  EVALUATION COMPLEXITY: Low   GOALS: Goals reviewed with patient? Yes  SHORT TERM GOALS: Target date: 02/02/2022   Jonathan Melendez will report independence with his day 1 home exercise program Baseline: Started 01/05/2022 Goal status: INITIAL  2.  Jonathan Melendez will be able to demonstrate correct standing and seated postures and begin to implement this into daily activities Baseline: Extremely flexed posture at evaluation Goal status: INITIAL  LONG TERM GOALS: Target date: 03/02/2022  Improve FOTO to 64 in 11 visits  or less Baseline: 50 Goal status: INITIAL  2.  Jonathan Melendez will report neck, scapular and bilateral upper extremity pain consistently 0-3 out of 10 on the visual analog scale (VAS) Baseline: 4-7/10 Goal status: INITIAL  3.  Improve cervical active range of motion for rotation to 50 degrees, lateral bending to 30 degrees and extension to 60 degrees Baseline: 45/15; 30/15; and 35 Goal status: INITIAL  4.  Improve cervical strength for extension to 40 and lateral bending to 24 pounds bilaterally Baseline: Less than 5 pounds all directions assessed Goal status: INITIAL  5.  Jonathan Melendez will be independent with his  long-term home exercise program at discharge Baseline: Started 01/05/2022 Goal status: INITIAL   PLAN:  PT FREQUENCY: 2x/week  PT DURATION: 4 weeks  PLANNED INTERVENTIONS: Therapeutic exercises, Therapeutic activity, Neuromuscular re-education, Patient/Family education, Self Care, Dry Needling, Spinal mobilization, Cryotherapy, Moist heat, Traction, and Manual therapy  PLAN FOR NEXT SESSION: Begin cervical traction at 25 pounds static, postural and body mechanics education and review home exercise program from day 1 with progressions as appropriate.   Farley Ly, PT, MPT 01/05/2022, 4:44 PM

## 2022-01-03 NOTE — Addendum Note (Signed)
Addended by: Ileene Rubens on: 01/03/2022 11:25 AM   Modules accepted: Orders

## 2022-01-04 ENCOUNTER — Telehealth: Payer: Self-pay | Admitting: Orthopedic Surgery

## 2022-01-04 NOTE — Telephone Encounter (Signed)
Patient has submitted disability forms. He is requesting continuous leave to start 01/03/2022 through his surgery. Please advise.

## 2022-01-05 ENCOUNTER — Encounter: Payer: Self-pay | Admitting: Rehabilitative and Restorative Service Providers"

## 2022-01-05 ENCOUNTER — Ambulatory Visit (INDEPENDENT_AMBULATORY_CARE_PROVIDER_SITE_OTHER): Payer: BC Managed Care – PPO | Admitting: Rehabilitative and Restorative Service Providers"

## 2022-01-05 DIAGNOSIS — M6281 Muscle weakness (generalized): Secondary | ICD-10-CM

## 2022-01-05 DIAGNOSIS — M5412 Radiculopathy, cervical region: Secondary | ICD-10-CM

## 2022-01-05 DIAGNOSIS — R293 Abnormal posture: Secondary | ICD-10-CM | POA: Diagnosis not present

## 2022-01-08 DIAGNOSIS — Z01818 Encounter for other preprocedural examination: Secondary | ICD-10-CM | POA: Diagnosis not present

## 2022-01-08 DIAGNOSIS — M5412 Radiculopathy, cervical region: Secondary | ICD-10-CM | POA: Diagnosis not present

## 2022-01-08 DIAGNOSIS — E119 Type 2 diabetes mellitus without complications: Secondary | ICD-10-CM | POA: Diagnosis not present

## 2022-01-08 DIAGNOSIS — Z23 Encounter for immunization: Secondary | ICD-10-CM | POA: Diagnosis not present

## 2022-01-09 NOTE — Telephone Encounter (Signed)
Please advise 

## 2022-01-10 ENCOUNTER — Telehealth: Payer: Self-pay | Admitting: Orthopedic Surgery

## 2022-01-10 ENCOUNTER — Encounter: Payer: Self-pay | Admitting: Radiology

## 2022-01-10 ENCOUNTER — Encounter: Payer: Self-pay | Admitting: Physical Therapy

## 2022-01-10 ENCOUNTER — Ambulatory Visit (INDEPENDENT_AMBULATORY_CARE_PROVIDER_SITE_OTHER): Payer: BC Managed Care – PPO | Admitting: Physical Therapy

## 2022-01-10 DIAGNOSIS — M6281 Muscle weakness (generalized): Secondary | ICD-10-CM | POA: Diagnosis not present

## 2022-01-10 DIAGNOSIS — R293 Abnormal posture: Secondary | ICD-10-CM

## 2022-01-10 DIAGNOSIS — M5412 Radiculopathy, cervical region: Secondary | ICD-10-CM

## 2022-01-10 NOTE — Telephone Encounter (Signed)
Work note & PT note faxed to [employer] [phone]. Form will be complete and faxed by [employer].

## 2022-01-10 NOTE — Therapy (Signed)
OUTPATIENT PHYSICAL THERAPY TREATMENT NOTE   Patient Name: Jonathan Melendez MRN: [id number] DOB:[birth year], 51 y.o., male Today's Date: 01/10/2022  PCP: Percell Belt, DO  REFERRING PROVIDER:  Callie Fielding, MD   END OF SESSION:   PT End of Session - 01/10/22 0826     Visit Number 2    Number of Visits 8    Date for PT Re-Evaluation 02/02/22    PT Start Time 0802    PT Stop Time 0840    PT Time Calculation (min) 38 min    Activity Tolerance Patient tolerated treatment well    Behavior During Therapy [facility] for tasks assessed/performed             Past Medical History:  Diagnosis Date   Ankylosing spondylitis ([place])    Diabetes mellitus without complication ([place])    Dyslipidemia    Hypertension    Pancreatitis    Past Surgical History:  Procedure Laterality Date   CYSTOSCOPY/URETEROSCOPY/HOLMIUM LASER/STENT PLACEMENT Right 05/25/2021   Procedure: CYSTOSCOPY/URETEROSCOPY/HOLMIUM LASER/STENT PLACEMENT;  Surgeon: Festus Aloe, MD;  Location: [facility];  Service: Urology;  Laterality: Right;   TONSILLECTOMY     VASECTOMY     VASECTOMY REVERSAL     Patient Active Problem List   Diagnosis Date Noted   Nephrolithiasis 05/25/2021   Hydronephrosis of right kidney 05/24/2021   Overweight (BMI 25.0-29.9) 05/24/2021   Type 2 diabetes mellitus with hyperglycemia ([place]) 05/24/2021   Pulmonary nodule 05/24/2021   Sinus bradycardia 05/24/2021   Urolithiasis 05/24/2021   Acute pancreatitis 06/03/2020   Abdominal pain 06/01/2020   Hypertension    Dyslipidemia    Diabetes mellitus without complication ([place])    Ankylosing spondylitis ([place])    Essential hypertension 07/24/2019   H/O non-insulin dependent diabetes mellitus 07/24/2019   Hyperlipidemia 07/24/2019   Chest pain of uncertain etiology 56/43/3295    REFERRING DIAG: M54.12 (ICD-10-CM) - Radiculopathy, cervical region   THERAPY DIAG:  Abnormal posture  Muscle weakness (generalized)  Radiculopathy, cervical  region  Rationale for Evaluation and Treatment Rehabilitation  PERTINENT HISTORY: Ankylosing spondylitis, diabetes mellitus and hypertension   PRECAUTIONS: none  SUBJECTIVE:                                                                                                                                                                                      SUBJECTIVE STATEMENT:   Pt arriving to therapy reporting 5-6/10 pain in his neck and still reporting numbness and tingling down Rt UE into fingertips.    PAIN:  Are you having pain? Yes, 5-6/10 pain in cervical spine   OBJECTIVE: (objective measures completed at initial evaluation unless otherwise dated)  OBJECTIVE:    DIAGNOSTIC FINDINGS:  IMPRESSION: 1. Broad-based right paracentral disc osteophyte complex at C6-7 with secondary flattening of the right hemi cord, with resultant moderate spinal stenosis, with severe right and moderate left C7 foraminal narrowing. This is suspected to be the symptomatic level. 2. Degenerative spondylosis at C5-6 with resultant moderate spinal stenosis, with moderate right worse than left C6 foraminal narrowing. 3. Severe left C5 foraminal stenosis related to uncovertebral and facet disease.     PATIENT SURVEYS:  FOTO 50 (Goal 64 in 11 visits)   COGNITION: Overall cognitive status: Within functional limits for tasks assessed   SENSATION: Significant for constant Rt UE pain and paresthesias and intermittent Lt upper extremity pain and paresthesias, particularly of the 1st-3rd digit bilaterally   POSTURE: rounded shoulders, forward head, and decreased lumbar lordosis                 CERVICAL ROM:    Active ROM A/PROM (deg) 01/05/2022  Flexion    Extension 35  Right lateral flexion 15  Left lateral flexion 30  Right rotation 15  Left rotation 45   (Blank rows = not tested)   UPPER EXTREMITY ROM:   Active ROM Right eval Left eval  Shoulder flexion      Shoulder extension       Shoulder abduction      Shoulder adduction      Shoulder extension      Shoulder internal rotation      Shoulder external rotation      Elbow flexion      Elbow extension      Wrist flexion      Wrist extension      Wrist ulnar deviation      Wrist radial deviation      Wrist pronation      Wrist supination       (Blank rows = not tested)   STRENGTH:   In pounds assessed by hand-held dynamometer Right 01/05/2022 Left 01/05/2022  Shoulder flexion      Shoulder extension      Shoulder abduction      Shoulder adduction      Shoulder extension      Shoulder internal rotation      Shoulder external rotation      Middle trapezius      Lower trapezius      Elbow flexion      Elbow extension      Wrist flexion      Wrist extension      Wrist ulnar deviation      Wrist radial deviation      Wrist pronation      Cervical lateral bending 0.0 pounds 4.9 pounds  Cervical extension 3.1 pounds     (Blank rows = not tested)     TODAY'S TREATMENT:  01/10/22:  TherEx:  Cervical retraction chin tuck: x 5 holding 5 sec Cervical extension with strap: x 5 holding 5 sec Rows: Level 3 band x 15 holding 3 sec Cervical rotation: x 5 each side holding 5 sec UBE: Level 2 2 minutes each direction Mechanical Traction:  12-18 pound pull, cervical protocol x 20 minutes  01/05/2022 Shoulder blade pinches 10X 5 seconds Cervical extension Isometrics 10X 5 seconds Reviewed exam findings, HEP    PATIENT EDUCATION:  Education details: See above Person educated: Patient Education method: Explanation, Demonstration, Tactile cues, Verbal cues, and Handouts Education comprehension: verbalized understanding, returned demonstration, verbal cues required, tactile cues required, and  needs further education   HOME EXERCISE PROGRAM: Access Code: [email or web address] URL: [email or web address] Date: 01/10/2022 Prepared by: Kearney Hard  Exercises - Standing Isometric Cervical Extension  with Manual Resistance  - 5 x daily - 7 x weekly - 1 sets - 5 reps - 5 hold - Standing Scapular Retraction  - 3-5 x daily - 7 x weekly - 1 sets - 5 reps - 5 second hold - Seated Cervical Rotation AROM  - 3 x daily - 7 x weekly - 5 reps - 5 seconds hold - Upper Cervical Extension SNAG with Strap  - 3 x daily - 7 x weekly - 5 reps - 5- 10 sec hold - Supine Chin Tuck  - 3 x daily - 7 x weekly - 10 reps - 5 seconds hold    ASSESSMENT:   CLINICAL IMPRESSION: Pt arriving today reporting 6/10 pain in his cervical spine. Pt still reporting intermittent numbness/tingling down Rt UE. Mechanical Traction performed with 12-18 pound pull with mild numbness in Rt UE toward end of traction trial.  Pt tolerating new exercises which were added to his HEP. Pt stating cervical extension using snag with the towel allowed the best relief.      OBJECTIVE IMPAIRMENTS: decreased activity tolerance, decreased endurance, decreased knowledge of condition, decreased ROM, decreased strength, decreased safety awareness, impaired perceived functional ability, increased muscle spasms, impaired UE functional use, improper body mechanics, postural dysfunction, and pain.    ACTIVITY LIMITATIONS: carrying, lifting, bending, sitting, standing, sleeping, stairs, and reach over head   PARTICIPATION LIMITATIONS: community activity and occupation   PERSONAL FACTORS: Time since onset of injury/illness/exacerbation are also affecting patient's functional outcome.    REHAB POTENTIAL: Fair We will start cervical traction at his next visit along with a review of his home exercises.  Reassessment will be completed in 3 to 4 weeks to determine if further rehabilitation is indicated or whether he is to be referred back to Dr. Laurance Flatten for surgery or additional medical management.  [name] and I also discussed that he is perfectly appropriate to proceed directly to surgery if his A1C is under control and he can get in to see Dr. Laurance Flatten sooner.    CLINICAL DECISION MAKING: Stable/uncomplicated   EVALUATION COMPLEXITY: Low     GOALS: Goals reviewed with patient? Yes   SHORT TERM GOALS: Target date: 02/02/2022    Jenny Reichmann will report independence with his day 1 home exercise program Baseline: Started 01/05/2022 Goal status: INITIAL   2.  [name] will be able to demonstrate correct standing and seated postures and begin to implement this into daily activities Baseline: Extremely flexed posture at evaluation Goal status: INITIAL   LONG TERM GOALS: Target date: 03/02/2022   Improve FOTO to 64 in 11 visits or less Baseline: 50 Goal status: INITIAL   2.  [name] will report neck, scapular and bilateral upper extremity pain consistently 0-3 out of 10 on the visual analog scale (VAS) Baseline: 4-7/10 Goal status: INITIAL   3.  Improve cervical active range of motion for rotation to 50 degrees, lateral bending to 30 degrees and extension to 60 degrees Baseline: 45/15; 30/15; and 35 Goal status: INITIAL   4.  Improve cervical strength for extension to 40 and lateral bending to 24 pounds bilaterally Baseline: Less than 5 pounds all directions assessed Goal status: INITIAL   5.  Jenny Reichmann will be independent with his long-term home exercise program at discharge Baseline: Started 01/05/2022 Goal status: INITIAL     PLAN:  PT FREQUENCY: 2x/week   PT DURATION: 4 weeks   PLANNED INTERVENTIONS: Therapeutic exercises, Therapeutic activity, Neuromuscular re-education, Patient/Family education, Self Care, Dry Needling, Spinal mobilization, Cryotherapy, Moist heat, Traction, and Manual therapy   PLAN FOR NEXT SESSION:assess response to traction, continue pending pt's preference, postural and body mechanics education, cervical ROM  Oretha Caprice, PT, MPT 01/10/2022, 10:46 AM

## 2022-01-10 NOTE — Telephone Encounter (Signed)
Patient wife [name] called in stating they urgently need patients paperwork (Fax Cover sheet,note from today, notes for PT from today also) sent over to [employer], the form he turned in should have the fax number. If it is not sent in today the company will deny the leave. Please call Wife Hollie at [phone] with any questions.

## 2022-01-15 ENCOUNTER — Ambulatory Visit (INDEPENDENT_AMBULATORY_CARE_PROVIDER_SITE_OTHER): Payer: BC Managed Care – PPO | Admitting: Rehabilitative and Restorative Service Providers"

## 2022-01-15 ENCOUNTER — Encounter: Payer: Self-pay | Admitting: Rehabilitative and Restorative Service Providers"

## 2022-01-15 DIAGNOSIS — M6281 Muscle weakness (generalized): Secondary | ICD-10-CM | POA: Diagnosis not present

## 2022-01-15 DIAGNOSIS — R293 Abnormal posture: Secondary | ICD-10-CM

## 2022-01-15 DIAGNOSIS — M5412 Radiculopathy, cervical region: Secondary | ICD-10-CM

## 2022-01-15 NOTE — Therapy (Signed)
OUTPATIENT PHYSICAL THERAPY TREATMENT NOTE   Patient Name: Jonathan Melendez MRN: [id number] DOB:[birth year], 51 y.o., male Today's Date: 01/15/2022  PCP: Percell Belt, DO  REFERRING PROVIDER:  Callie Fielding, MD   END OF SESSION:   PT End of Session - 01/15/22 1344     Visit Number 3    Number of Visits 8    Date for PT Re-Evaluation 02/02/22    PT Start Time 1344    PT Stop Time 1425    PT Time Calculation (min) 41 min    Activity Tolerance Patient tolerated treatment well;No increased pain;Patient limited by pain    Behavior During Therapy [facility] for tasks assessed/performed              Past Medical History:  Diagnosis Date   Ankylosing spondylitis ([place])    Diabetes mellitus without complication ([place])    Dyslipidemia    Hypertension    Pancreatitis    Past Surgical History:  Procedure Laterality Date   CYSTOSCOPY/URETEROSCOPY/HOLMIUM LASER/STENT PLACEMENT Right 05/25/2021   Procedure: CYSTOSCOPY/URETEROSCOPY/HOLMIUM LASER/STENT PLACEMENT;  Surgeon: Festus Aloe, MD;  Location: [facility];  Service: Urology;  Laterality: Right;   TONSILLECTOMY     VASECTOMY     VASECTOMY REVERSAL     Patient Active Problem List   Diagnosis Date Noted   Nephrolithiasis 05/25/2021   Hydronephrosis of right kidney 05/24/2021   Overweight (BMI 25.0-29.9) 05/24/2021   Type 2 diabetes mellitus with hyperglycemia ([place]) 05/24/2021   Pulmonary nodule 05/24/2021   Sinus bradycardia 05/24/2021   Urolithiasis 05/24/2021   Acute pancreatitis 06/03/2020   Abdominal pain 06/01/2020   Hypertension    Dyslipidemia    Diabetes mellitus without complication ([place])    Ankylosing spondylitis ([place])    Essential hypertension 07/24/2019   H/O non-insulin dependent diabetes mellitus 07/24/2019   Hyperlipidemia 07/24/2019   Chest pain of uncertain etiology 12/87/8676    REFERRING DIAG: M54.12 (ICD-10-CM) - Radiculopathy, cervical region   THERAPY DIAG:  Abnormal posture  Muscle  weakness (generalized)  Radiculopathy, cervical region  Rationale for Evaluation and Treatment Rehabilitation  PERTINENT HISTORY: Ankylosing spondylitis, diabetes mellitus and hypertension   PRECAUTIONS: none  SUBJECTIVE:                                                                                                                                                                                      SUBJECTIVE STATEMENT: Jonathan Melendez reports good early HEP compliance.  His Right arm has been most irritating the past 4-5 days.  PAIN:  Are you having pain? Neck 5-6/10 since Thanksgiving.   Left arm 0-3/10 and Right arm 5-8/10.   OBJECTIVE: (objective measures completed  at initial evaluation unless otherwise dated)  OBJECTIVE:    DIAGNOSTIC FINDINGS:  IMPRESSION: 1. Broad-based right paracentral disc osteophyte complex at C6-7 with secondary flattening of the right hemi cord, with resultant moderate spinal stenosis, with severe right and moderate left C7 foraminal narrowing. This is suspected to be the symptomatic level. 2. Degenerative spondylosis at C5-6 with resultant moderate spinal stenosis, with moderate right worse than left C6 foraminal narrowing. 3. Severe left C5 foraminal stenosis related to uncovertebral and facet disease.     PATIENT SURVEYS:  FOTO 50 (Goal 64 in 11 visits)   COGNITION: Overall cognitive status: Within functional limits for tasks assessed   SENSATION: Significant for constant Rt UE pain and paresthesias and intermittent Lt upper extremity pain and paresthesias, particularly of the 1st-3rd digit bilaterally   POSTURE: rounded shoulders, forward head, and decreased lumbar lordosis                 CERVICAL ROM:    Active ROM A/PROM (deg) 01/05/2022  Flexion    Extension 35  Right lateral flexion 15  Left lateral flexion 30  Right rotation 15  Left rotation 45   (Blank rows = not tested)   UPPER EXTREMITY ROM:   Active ROM Right eval  Left eval  Shoulder flexion      Shoulder extension      Shoulder abduction      Shoulder adduction      Shoulder extension      Shoulder internal rotation      Shoulder external rotation      Elbow flexion      Elbow extension      Wrist flexion      Wrist extension      Wrist ulnar deviation      Wrist radial deviation      Wrist pronation      Wrist supination       (Blank rows = not tested)   STRENGTH:   In pounds assessed by hand-held dynamometer Right 01/05/2022 Left 01/05/2022  Shoulder flexion      Shoulder extension      Shoulder abduction      Shoulder adduction      Shoulder extension      Shoulder internal rotation      Shoulder external rotation      Middle trapezius      Lower trapezius      Elbow flexion      Elbow extension      Wrist flexion      Wrist extension      Wrist ulnar deviation      Wrist radial deviation      Wrist pronation      Cervical lateral bending 0.0 pounds 4.9 pounds  Cervical extension 3.1 pounds     (Blank rows = not tested)     TODAY'S TREATMENT:  01/15/2022 Cervical traction static 25-28 pounds for 12 minutes  Shoulder blade pinches 10X 5 seconds Cervical Isometrics Extension 10X 5 seconds Rows/Pull to chest Blue 20X 3 seconds Shoulder ER theraband Green 10X Bil 3 seconds Shoulder extension theraband    01/10/22:  TherEx:  Cervical retraction chin tuck: x 5 holding 5 sec Cervical extension with strap: x 5 holding 5 sec Rows: Level 3 band x 15 holding 3 sec Cervical rotation: x 5 each side holding 5 sec UBE: Level 2 2 minutes each direction Mechanical Traction:  12-18 pound pull, cervical protocol x 20 minutes   01/05/2022 Shoulder  blade pinches 10X 5 seconds Cervical extension Isometrics 10X 5 seconds Reviewed exam findings, HEP     PATIENT EDUCATION:  Education details: See above Person educated: Patient Education method: Explanation, Demonstration, Tactile cues, Verbal cues, and Handouts Education  comprehension: verbalized understanding, returned demonstration, verbal cues required, tactile cues required, and needs further education   HOME EXERCISE PROGRAM: Access Code: [email or web address] URL: [email or web address] Date: 01/10/2022 Prepared by: Kearney Hard  Exercises - Standing Isometric Cervical Extension with Manual Resistance  - 5 x daily - 7 x weekly - 1 sets - 5 reps - 5 hold - Standing Scapular Retraction  - 3-5 x daily - 7 x weekly - 1 sets - 5 reps - 5 second hold - Seated Cervical Rotation AROM  - 3 x daily - 7 x weekly - 5 reps - 5 seconds hold - Upper Cervical Extension SNAG with Strap  - 3 x daily - 7 x weekly - 5 reps - 5- 10 sec hold - Supine Chin Tuck  - 3 x daily - 7 x weekly - 10 reps - 5 seconds hold    ASSESSMENT:   CLINICAL IMPRESSION: Jenny Reichmann has severe ankylosing spondylitis.  He does get relief in bilateral arms with 25-28# of traction, suggesting nerve root impingement.  We will continue to appropriately and conservatively progress strengthening activities so as not to flare up his radicular symptoms while still working on scapular and postural strength.  Jonathan Melendez and I also discussed the possibility of scheduling a follow-up with Dr. Laurance Flatten in mid-December so as to possibly get on his surgical schedule if progress with physical therapy is not as much as we would like.  He is still on target to be reassessed in mid-December with particular emphasis on resolving as many of his peripheral symptoms as possible.    OBJECTIVE IMPAIRMENTS: decreased activity tolerance, decreased endurance, decreased knowledge of condition, decreased ROM, decreased strength, decreased safety awareness, impaired perceived functional ability, increased muscle spasms, impaired UE functional use, improper body mechanics, postural dysfunction, and pain.    ACTIVITY LIMITATIONS: carrying, lifting, bending, sitting, standing, sleeping, stairs, and reach over head   PARTICIPATION  LIMITATIONS: community activity and occupation   PERSONAL FACTORS: Time since onset of injury/illness/exacerbation are also affecting patient's functional outcome.    REHAB POTENTIAL: Fair We will start cervical traction at his next visit along with a review of his home exercises.  Reassessment will be completed in 3 to 4 weeks to determine if further rehabilitation is indicated or whether he is to be referred back to Dr. Laurance Flatten for surgery or additional medical management.  Jonathan Melendez and I also discussed that he is perfectly appropriate to proceed directly to surgery if his A1C is under control and he can get in to see Dr. Laurance Flatten sooner.   CLINICAL DECISION MAKING: Stable/uncomplicated   EVALUATION COMPLEXITY: Low     GOALS: Goals reviewed with patient? Yes   SHORT TERM GOALS: Target date: 02/02/2022    Jenny Reichmann will report independence with his day 1 home exercise program Baseline: Started 01/05/2022 Goal status: Met 01/15/2022   2.  Jonathan Melendez will be able to demonstrate correct standing and seated postures and begin to implement this into daily activities Baseline: Extremely flexed posture at evaluation Goal status: On Going 01/15/2022   LONG TERM GOALS: Target date: 03/02/2022   Improve FOTO to 64 in 11 visits or less Baseline: 50 Goal status: INITIAL   2.  Jonathan Melendez will report neck, scapular and bilateral upper extremity pain consistently 0-3  out of 10 on the visual analog scale (VAS) Baseline: 4-7/10 Goal status: On Going 01/15/2022   3.  Improve cervical active range of motion for rotation to 50 degrees, lateral bending to 30 degrees and extension to 60 degrees Baseline: 45/15; 30/15; and 35 Goal status: INITIAL   4.  Improve cervical strength for extension to 40 and lateral bending to 24 pounds bilaterally Baseline: Less than 5 pounds all directions assessed Goal status: INITIAL   5.  Jenny Reichmann will be independent with his long-term home exercise program at discharge Baseline: Started  01/05/2022 Goal status: On Going 01/15/2022     PLAN:   PT FREQUENCY: 2x/week   PT DURATION: 4 weeks   PLANNED INTERVENTIONS: Therapeutic exercises, Therapeutic activity, Neuromuscular re-education, Patient/Family education, Self Care, Dry Needling, Spinal mobilization, Cryotherapy, Moist heat, Traction, and Manual therapy   PLAN FOR NEXT SESSION: 25-28# of static traction (he gets relief with 25-28#), continue cervical and scapular strengthening, postural and body mechanics education, cervical ROM  Farley Ly, PT, MPT 01/15/2022, 2:32 PM

## 2022-01-17 ENCOUNTER — Encounter: Payer: Self-pay | Admitting: Rehabilitative and Restorative Service Providers"

## 2022-01-17 ENCOUNTER — Ambulatory Visit (INDEPENDENT_AMBULATORY_CARE_PROVIDER_SITE_OTHER): Payer: BC Managed Care – PPO | Admitting: Rehabilitative and Restorative Service Providers"

## 2022-01-17 DIAGNOSIS — M5412 Radiculopathy, cervical region: Secondary | ICD-10-CM | POA: Diagnosis not present

## 2022-01-17 DIAGNOSIS — M6281 Muscle weakness (generalized): Secondary | ICD-10-CM

## 2022-01-17 DIAGNOSIS — R293 Abnormal posture: Secondary | ICD-10-CM | POA: Diagnosis not present

## 2022-01-17 NOTE — Therapy (Signed)
OUTPATIENT PHYSICAL THERAPY TREATMENT NOTE   Patient Name: Jonathan Melendez MRN: [id number] DOB:14-Jun-1970, 51 y.o., male Today's Date: 01/17/2022  PCP: Percell Belt, DO  REFERRING PROVIDER:  Callie Fielding, MD   END OF SESSION:   PT End of Session - 01/17/22 1440     Visit Number 4    Number of Visits 8    Date for PT Re-Evaluation 02/02/22    PT Start Time 1749    PT Stop Time 1511    PT Time Calculation (min) 39 min    Activity Tolerance Patient tolerated treatment well;No increased pain;Patient limited by pain    Behavior During Therapy [facility] for tasks assessed/performed            Past Medical History:  Diagnosis Date   Ankylosing spondylitis ([place])    Diabetes mellitus without complication ([place])    Dyslipidemia    Hypertension    Pancreatitis    Past Surgical History:  Procedure Laterality Date   CYSTOSCOPY/URETEROSCOPY/HOLMIUM LASER/STENT PLACEMENT Right 05/25/2021   Procedure: CYSTOSCOPY/URETEROSCOPY/HOLMIUM LASER/STENT PLACEMENT;  Surgeon: Festus Aloe, MD;  Location: [facility];  Service: Urology;  Laterality: Right;   TONSILLECTOMY     VASECTOMY     VASECTOMY REVERSAL     Patient Active Problem List   Diagnosis Date Noted   Nephrolithiasis 05/25/2021   Hydronephrosis of right kidney 05/24/2021   Overweight (BMI 25.0-29.9) 05/24/2021   Type 2 diabetes mellitus with hyperglycemia ([place]) 05/24/2021   Pulmonary nodule 05/24/2021   Sinus bradycardia 05/24/2021   Urolithiasis 05/24/2021   Acute pancreatitis 06/03/2020   Abdominal pain 06/01/2020   Hypertension    Dyslipidemia    Diabetes mellitus without complication ([place])    Ankylosing spondylitis ([place])    Essential hypertension 07/24/2019   H/O non-insulin dependent diabetes mellitus 07/24/2019   Hyperlipidemia 07/24/2019   Chest pain of uncertain etiology 44/96/7591    REFERRING DIAG: M54.12 (ICD-10-CM) - Radiculopathy, cervical region   THERAPY DIAG:  Abnormal posture  Muscle weakness  (generalized)  Radiculopathy, cervical region  Rationale for Evaluation and Treatment Rehabilitation  PERTINENT HISTORY: Ankylosing spondylitis, diabetes mellitus and hypertension   PRECAUTIONS: none  SUBJECTIVE:                                                                                                                                                                                      SUBJECTIVE STATEMENT: Jonathan Melendez reports good HEP compliance yesterday.  His Right arm has been most irritating the past 4-5 days.  He notes Left thumb tingling/itchy sensation.  PAIN:  Are you having pain? Neck 5-6/10 since Monday.  Left arm 0-4/10 and Right arm 5-6/10.  OBJECTIVE: (objective measures completed at initial evaluation unless otherwise dated)  OBJECTIVE:    DIAGNOSTIC FINDINGS:  IMPRESSION: 1. Broad-based right paracentral disc osteophyte complex at C6-7 with secondary flattening of the right hemi cord, with resultant moderate spinal stenosis, with severe right and moderate left C7 foraminal narrowing. This is suspected to be the symptomatic level. 2. Degenerative spondylosis at C5-6 with resultant moderate spinal stenosis, with moderate right worse than left C6 foraminal narrowing. 3. Severe left C5 foraminal stenosis related to uncovertebral and facet disease.     PATIENT SURVEYS:  FOTO 50 (Goal 64 in 11 visits)   COGNITION: Overall cognitive status: Within functional limits for tasks assessed   SENSATION: Significant for constant Rt UE pain and paresthesias and intermittent Lt upper extremity pain and paresthesias, particularly of the 1st-3rd digit bilaterally   POSTURE: rounded shoulders, forward head, and decreased lumbar lordosis                 CERVICAL ROM:    Active ROM A/PROM (deg) 01/05/2022  Flexion    Extension 35  Right lateral flexion 15  Left lateral flexion 30  Right rotation 15  Left rotation 45   (Blank rows = not tested)   UPPER EXTREMITY  ROM:   Active ROM Right eval Left eval  Shoulder flexion      Shoulder extension      Shoulder abduction      Shoulder adduction      Shoulder extension      Shoulder internal rotation      Shoulder external rotation      Elbow flexion      Elbow extension      Wrist flexion      Wrist extension      Wrist ulnar deviation      Wrist radial deviation      Wrist pronation      Wrist supination       (Blank rows = not tested)   STRENGTH:   In pounds assessed by hand-held dynamometer Right 01/05/2022 Left 01/05/2022  Shoulder flexion      Shoulder extension      Shoulder abduction      Shoulder adduction      Shoulder extension      Shoulder internal rotation      Shoulder external rotation      Middle trapezius      Lower trapezius      Elbow flexion      Elbow extension      Wrist flexion      Wrist extension      Wrist ulnar deviation      Wrist radial deviation      Wrist pronation      Cervical lateral bending 0.0 pounds 4.9 pounds  Cervical extension 3.1 pounds     (Blank rows = not tested)     TODAY'S TREATMENT:  01/17/2022 Cervical traction static 29 pounds for 12 minutes  Shoulder blade pinches 10X 5 seconds Cervical Isometrics Extension 10X 5 seconds Rows/Pull to chest Blue 20X 3 seconds  UBE Pull only :30 Pull/:30 Rest for 8 minutes Level 4.5 Target 30 RPM   01/15/2022 Cervical traction static 25-28 pounds for 12 minutes  Shoulder blade pinches 10X 5 seconds Cervical Isometrics Extension 10X 5 seconds Rows/Pull to chest Blue 20X 3 seconds Shoulder ER theraband Green 10X Bil 3 seconds Shoulder extension theraband    01/10/22:  TherEx:  Cervical retraction chin tuck: x 5 holding 5  sec Cervical extension with strap: x 5 holding 5 sec Rows: Level 3 band x 15 holding 3 sec Cervical rotation: x 5 each side holding 5 sec UBE: Level 2 2 minutes each direction Mechanical Traction:  12-18 pound pull, cervical protocol x 20 minutes   PATIENT  EDUCATION:  Education details: See above Person educated: Patient Education method: Explanation, Demonstration, Tactile cues, Verbal cues, and Handouts Education comprehension: verbalized understanding, returned demonstration, verbal cues required, tactile cues required, and needs further education   HOME EXERCISE PROGRAM: Access Code: [email or web address] URL: [email or web address] Date: 01/17/2022 Prepared by: Vista Mink  Exercises - Standing Isometric Cervical Extension with Manual Resistance  - 5 x daily - 7 x weekly - 1 sets - 5 reps - 5 hold - Standing Scapular Retraction  - 3-5 x daily - 7 x weekly - 1 sets - 5 reps - 5 second hold - Seated Cervical Rotation AROM  - 3 x daily - 7 x weekly - 5 reps - 5 seconds hold - Upper Cervical Extension SNAG with Strap  - 3 x daily - 7 x weekly - 5 reps - 5- 10 sec hold - Supine Chin Tuck  - 3 x daily - 7 x weekly - 10 reps - 5 seconds hold - Standing Row with Anchored Resistance Band with PLB  - 1 x daily - 7 x weekly - 1 sets - 20 reps - 3 seconds hold    ASSESSMENT:   CLINICAL IMPRESSION: Jenny Reichmann has severe ankylosing spondylitis.  He does get relief in bilateral arms with 29# of traction, suggesting nerve root impingement.  We will continue to appropriately and conservatively progress strengthening activities so as not to flare up his radicular symptoms while still working on scapular and postural strength.  Jenny Reichmann is having his A1C checked tomorrow to see if he can go ahead and have surgery as this is the only thing keeping him from being a surgical candidate.  We discussed that it is perfectly acceptable to call and cancel his PT visits if he gets the [facility] for [facility].    OBJECTIVE IMPAIRMENTS: decreased activity tolerance, decreased endurance, decreased knowledge of condition, decreased ROM, decreased strength, decreased safety awareness, impaired perceived functional ability, increased muscle spasms, impaired UE functional use, improper  body mechanics, postural dysfunction, and pain.    ACTIVITY LIMITATIONS: carrying, lifting, bending, sitting, standing, sleeping, stairs, and reach over head   PARTICIPATION LIMITATIONS: community activity and occupation   PERSONAL FACTORS: Time since onset of injury/illness/exacerbation are also affecting patient's functional outcome.    REHAB POTENTIAL: Fair We will start cervical traction at his next visit along with a review of his home exercises.  Reassessment will be completed in 3 to 4 weeks to determine if further rehabilitation is indicated or whether he is to be referred back to Dr. Laurance Flatten for surgery or additional medical management.  Jonathan Melendez and I also discussed that he is perfectly appropriate to proceed directly to surgery if his A1C is under control and he can get in to see Dr. Laurance Flatten sooner.   CLINICAL DECISION MAKING: Stable/uncomplicated   EVALUATION COMPLEXITY: Low     GOALS: Goals reviewed with patient? Yes   SHORT TERM GOALS: Target date: 02/02/2022    Jenny Reichmann will report independence with his day 1 home exercise program Baseline: Started 01/05/2022 Goal status: Met 01/15/2022   2.  Jonathan Melendez will be able to demonstrate correct standing and seated postures and begin to implement this into daily activities Baseline: Extremely flexed posture  at evaluation Goal status: On Going 01/15/2022   LONG TERM GOALS: Target date: 03/02/2022   Improve FOTO to 64 in 11 visits or less Baseline: 50 Goal status: INITIAL   2.  Jonathan Melendez will report neck, scapular and bilateral upper extremity pain consistently 0-3 out of 10 on the visual analog scale (VAS) Baseline: 4-7/10 Goal status: On Going 01/15/2022   3.  Improve cervical active range of motion for rotation to 50 degrees, lateral bending to 30 degrees and extension to 60 degrees Baseline: 45/15; 30/15; and 35 Goal status: INITIAL   4.  Improve cervical strength for extension to 40 and lateral bending to 24 pounds bilaterally Baseline:  Less than 5 pounds all directions assessed Goal status: INITIAL   5.  Jenny Reichmann will be independent with his long-term home exercise program at discharge Baseline: Started 01/05/2022 Goal status: On Going 01/15/2022     PLAN:   PT FREQUENCY: 2x/week   PT DURATION: 4 weeks   PLANNED INTERVENTIONS: Therapeutic exercises, Therapeutic activity, Neuromuscular re-education, Patient/Family education, Self Care, Dry Needling, Spinal mobilization, Cryotherapy, Moist heat, Traction, and Manual therapy   PLAN FOR NEXT SESSION: 28# of static traction (he gets relief with 28#), continue cervical and scapular strengthening, postural and body mechanics education, cervical ROM as appropriate while avoiding irritating peripheral symptoms.  Progress slowly and conservatively.  Farley Ly, PT, MPT 01/17/2022, 3:15 PM

## 2022-01-19 DIAGNOSIS — M542 Cervicalgia: Secondary | ICD-10-CM | POA: Diagnosis not present

## 2022-01-19 DIAGNOSIS — M450 Ankylosing spondylitis of multiple sites in spine: Secondary | ICD-10-CM | POA: Diagnosis not present

## 2022-01-22 ENCOUNTER — Telehealth: Payer: Self-pay | Admitting: Orthopedic Surgery

## 2022-01-22 NOTE — Telephone Encounter (Signed)
I called and advised patient on Dr. Tawanna Sat response.  He is asking if he should return to work since he is on disability and since he is not able to get surgery soon.  Patient states that his A1C was done last week and it was done to 8.5. Please advise if he should return to work. I did ask if he felt like he could return and he states that his arms are getting worse.

## 2022-01-22 NOTE — Telephone Encounter (Signed)
Patient states he is ready for neck surgery and that he is waiting on someone to call with Lab results and to get Dr. Tawanna Sat approval..[phone]

## 2022-01-22 NOTE — Telephone Encounter (Signed)
I called and advised him of Dr. Tawanna Sat message, he states that since he was already out of work so he will just continue on the disability

## 2022-01-23 ENCOUNTER — Encounter: Payer: Self-pay | Admitting: Physical Therapy

## 2022-01-23 ENCOUNTER — Ambulatory Visit (INDEPENDENT_AMBULATORY_CARE_PROVIDER_SITE_OTHER): Payer: BC Managed Care – PPO | Admitting: Physical Therapy

## 2022-01-23 DIAGNOSIS — M5412 Radiculopathy, cervical region: Secondary | ICD-10-CM | POA: Diagnosis not present

## 2022-01-23 DIAGNOSIS — M6281 Muscle weakness (generalized): Secondary | ICD-10-CM

## 2022-01-23 DIAGNOSIS — R293 Abnormal posture: Secondary | ICD-10-CM | POA: Diagnosis not present

## 2022-01-23 NOTE — Therapy (Signed)
OUTPATIENT PHYSICAL THERAPY TREATMENT NOTE   Patient Name: Jonathan Melendez MRN: [id number] DOB:1970/08/04, 51 y.o., male Today's Date: 01/23/2022  PCP: Percell Belt, DO  REFERRING PROVIDER:  Callie Fielding, MD   END OF SESSION:   PT End of Session - 01/23/22 1121     Visit Number 5    Number of Visits 8    Date for PT Re-Evaluation 02/02/22    PT Start Time 1100    PT Stop Time 1130    PT Time Calculation (min) 30 min    Activity Tolerance Patient tolerated treatment well;Patient limited by pain    Behavior During Therapy [facility] for tasks assessed/performed             Past Medical History:  Diagnosis Date   Ankylosing spondylitis ([place])    Diabetes mellitus without complication ([place])    Dyslipidemia    Hypertension    Pancreatitis    Past Surgical History:  Procedure Laterality Date   CYSTOSCOPY/URETEROSCOPY/HOLMIUM LASER/STENT PLACEMENT Right 05/25/2021   Procedure: CYSTOSCOPY/URETEROSCOPY/HOLMIUM LASER/STENT PLACEMENT;  Surgeon: Festus Aloe, MD;  Location: [facility];  Service: Urology;  Laterality: Right;   TONSILLECTOMY     VASECTOMY     VASECTOMY REVERSAL     Patient Active Problem List   Diagnosis Date Noted   Nephrolithiasis 05/25/2021   Hydronephrosis of right kidney 05/24/2021   Overweight (BMI 25.0-29.9) 05/24/2021   Type 2 diabetes mellitus with hyperglycemia ([place]) 05/24/2021   Pulmonary nodule 05/24/2021   Sinus bradycardia 05/24/2021   Urolithiasis 05/24/2021   Acute pancreatitis 06/03/2020   Abdominal pain 06/01/2020   Hypertension    Dyslipidemia    Diabetes mellitus without complication ([place])    Ankylosing spondylitis ([place])    Essential hypertension 07/24/2019   H/O non-insulin dependent diabetes mellitus 07/24/2019   Hyperlipidemia 07/24/2019   Chest pain of uncertain etiology 02/58/5277    REFERRING DIAG: M54.12 (ICD-10-CM) - Radiculopathy, cervical region   THERAPY DIAG:  Abnormal posture  Muscle weakness  (generalized)  Radiculopathy, cervical region  Rationale for Evaluation and Treatment Rehabilitation  PERTINENT HISTORY: Ankylosing spondylitis, diabetes mellitus and hypertension   PRECAUTIONS: none  SUBJECTIVE:                                                                                                                                                                                      SUBJECTIVE STATEMENT: Pt reporting Rt side neck pain c radiation down Rt UE into finger tips 8/10 pain. Pt stating left side is 3/10 with radiation down left arm. Pt stating today is a bad day and he may only feel like performing the traction which provides some relief.  PAIN:  Are you having pain? Yes, see above statement   OBJECTIVE: (objective measures completed at initial evaluation unless otherwise dated)  OBJECTIVE:    DIAGNOSTIC FINDINGS:  IMPRESSION: 1. Broad-based right paracentral disc osteophyte complex at C6-7 with secondary flattening of the right hemi cord, with resultant moderate spinal stenosis, with severe right and moderate left C7 foraminal narrowing. This is suspected to be the symptomatic level. 2. Degenerative spondylosis at C5-6 with resultant moderate spinal stenosis, with moderate right worse than left C6 foraminal narrowing. 3. Severe left C5 foraminal stenosis related to uncovertebral and facet disease.     PATIENT SURVEYS:  FOTO 50 (Goal 64 in 11 visits)   COGNITION: Overall cognitive status: Within functional limits for tasks assessed   SENSATION: Significant for constant Rt UE pain and paresthesias and intermittent Lt upper extremity pain and paresthesias, particularly of the 1st-3rd digit bilaterally   POSTURE: rounded shoulders, forward head, and decreased lumbar lordosis                 CERVICAL ROM:    Active ROM A/PROM (deg) 01/05/2022  Flexion    Extension 35  Right lateral flexion 15  Left lateral flexion 30  Right rotation 15  Left  rotation 45   (Blank rows = not tested)   UPPER EXTREMITY ROM:   Active ROM Right eval Left eval  Shoulder flexion      Shoulder extension      Shoulder abduction      Shoulder adduction      Shoulder extension      Shoulder internal rotation      Shoulder external rotation      Elbow flexion      Elbow extension      Wrist flexion      Wrist extension      Wrist ulnar deviation      Wrist radial deviation      Wrist pronation      Wrist supination       (Blank rows = not tested)   STRENGTH:   In pounds assessed by hand-held dynamometer Right 01/05/2022 Left 01/05/2022  Shoulder flexion      Shoulder extension      Shoulder abduction      Shoulder adduction      Shoulder extension      Shoulder internal rotation      Shoulder external rotation      Middle trapezius      Lower trapezius      Elbow flexion      Elbow extension      Wrist flexion      Wrist extension      Wrist ulnar deviation      Wrist radial deviation      Wrist pronation      Cervical lateral bending 0.0 pounds 4.9 pounds  Cervical extension 3.1 pounds     (Blank rows = not tested)     TODAY'S TREATMENT:  01/23/22:  Mechanical Traction:  28# static traction for 22 minutes  01/17/2022 Cervical traction static 29 pounds for 12 minutes  Shoulder blade pinches 10X 5 seconds Cervical Isometrics Extension 10X 5 seconds Rows/Pull to chest Blue 20X 3 seconds  UBE Pull only :30 Pull/:30 Rest for 8 minutes Level 4.5 Target 30 RPM   01/15/2022 Cervical traction static 25-28 pounds for 12 minutes  Shoulder blade pinches 10X 5 seconds Cervical Isometrics Extension 10X 5 seconds Rows/Pull to chest Blue 20X 3 seconds Shoulder ER  theraband Green 10X Bil 3 seconds Shoulder extension theraband    01/10/22:  TherEx:  Cervical retraction chin tuck: x 5 holding 5 sec Cervical extension with strap: x 5 holding 5 sec Rows: Level 3 band x 15 holding 3 sec Cervical rotation: x 5 each side  holding 5 sec UBE: Level 2 2 minutes each direction Mechanical Traction:  12-18 pound pull, cervical protocol x 20 minutes   PATIENT EDUCATION:  Education details: See above Person educated: Patient Education method: Explanation, Demonstration, Tactile cues, Verbal cues, and Handouts Education comprehension: verbalized understanding, returned demonstration, verbal cues required, tactile cues required, and needs further education   HOME EXERCISE PROGRAM: Access Code: [email or web address] URL: [email or web address] Date: 01/17/2022 Prepared by: Vista Mink  Exercises - Standing Isometric Cervical Extension with Manual Resistance  - 5 x daily - 7 x weekly - 1 sets - 5 reps - 5 hold - Standing Scapular Retraction  - 3-5 x daily - 7 x weekly - 1 sets - 5 reps - 5 second hold - Seated Cervical Rotation AROM  - 3 x daily - 7 x weekly - 5 reps - 5 seconds hold - Upper Cervical Extension SNAG with Strap  - 3 x daily - 7 x weekly - 5 reps - 5- 10 sec hold - Supine Chin Tuck  - 3 x daily - 7 x weekly - 10 reps - 5 seconds hold - Standing Row with Anchored Resistance Band with PLB  - 1 x daily - 7 x weekly - 1 sets - 20 reps - 3 seconds hold    ASSESSMENT:   CLINICAL IMPRESSION: Pt arriving today stating his A1C has improved but is still not where his doctor wants it for surgery. Pt stating today he was having a bad day with pain of 8/10 in his neck with radiation down bil UE's which is worse on the right. Pt stating he just wanted to try traction and hold off on exercises today due to increased pain.  Continue skilled PT to maximize pt's function while he is waiting for surgical interventions.      OBJECTIVE IMPAIRMENTS: decreased activity tolerance, decreased endurance, decreased knowledge of condition, decreased ROM, decreased strength, decreased safety awareness, impaired perceived functional ability, increased muscle spasms, impaired UE functional use, improper body mechanics, postural  dysfunction, and pain.    ACTIVITY LIMITATIONS: carrying, lifting, bending, sitting, standing, sleeping, stairs, and reach over head   PARTICIPATION LIMITATIONS: community activity and occupation   PERSONAL FACTORS: Time since onset of injury/illness/exacerbation are also affecting patient's functional outcome.    REHAB POTENTIAL: Fair We will start cervical traction at his next visit along with a review of his home exercises.  Reassessment will be completed in 3 to 4 weeks to determine if further rehabilitation is indicated or whether he is to be referred back to Dr. Laurance Flatten for surgery or additional medical management.  [name] and I also discussed that he is perfectly appropriate to proceed directly to surgery if his A1C is under control and he can get in to see Dr. Laurance Flatten sooner.   CLINICAL DECISION MAKING: Stable/uncomplicated   EVALUATION COMPLEXITY: Low     GOALS: Goals reviewed with patient? Yes   SHORT TERM GOALS: Target date: 02/02/2022    Jenny Reichmann will report independence with his day 1 home exercise program Baseline: Started 01/05/2022 Goal status: Met 01/15/2022   2.  [name] will be able to demonstrate correct standing and seated postures and begin to implement this into daily activities Baseline:  Extremely flexed posture at evaluation Goal status: MET 01/23/22   LONG TERM GOALS: Target date: 03/02/2022   Improve FOTO to 64 in 11 visits or less Baseline: 50 Goal status: INITIAL   2.  [name] will report neck, scapular and bilateral upper extremity pain consistently 0-3 out of 10 on the visual analog scale (VAS) Baseline: 4-7/10 Goal status: On Going 01/15/2022   3.  Improve cervical active range of motion for rotation to 50 degrees, lateral bending to 30 degrees and extension to 60 degrees Baseline: 45/15; 30/15; and 35 Goal status: INITIAL   4.  Improve cervical strength for extension to 40 and lateral bending to 24 pounds bilaterally Baseline: Less than 5 pounds all  directions assessed Goal status: INITIAL   5.  Jenny Reichmann will be independent with his long-term home exercise program at discharge Baseline: Started 01/05/2022 Goal status: On Going 01/15/2022     PLAN:   PT FREQUENCY: 2x/week   PT DURATION: 4 weeks   PLANNED INTERVENTIONS: Therapeutic exercises, Therapeutic activity, Neuromuscular re-education, Patient/Family education, Self Care, Dry Needling, Spinal mobilization, Cryotherapy, Moist heat, Traction, and Manual therapy   PLAN FOR NEXT SESSION: 28# of static traction, continue cervical and scapular strengthening, postural and body mechanics education, cervical ROM as appropriate while avoiding irritating peripheral symptoms.  Progress slowly and conservatively.  Oretha Caprice, PT, MPT 01/23/2022, 11:23 AM

## 2022-01-25 ENCOUNTER — Encounter: Payer: BC Managed Care – PPO | Admitting: Physical Therapy

## 2022-01-29 ENCOUNTER — Ambulatory Visit (INDEPENDENT_AMBULATORY_CARE_PROVIDER_SITE_OTHER): Payer: BC Managed Care – PPO | Admitting: Physical Therapy

## 2022-01-29 ENCOUNTER — Encounter: Payer: Self-pay | Admitting: Physical Therapy

## 2022-01-29 DIAGNOSIS — M5412 Radiculopathy, cervical region: Secondary | ICD-10-CM

## 2022-01-29 DIAGNOSIS — R293 Abnormal posture: Secondary | ICD-10-CM | POA: Diagnosis not present

## 2022-01-29 DIAGNOSIS — M6281 Muscle weakness (generalized): Secondary | ICD-10-CM

## 2022-01-29 NOTE — Therapy (Signed)
OUTPATIENT PHYSICAL THERAPY TREATMENT NOTE   Patient Name: Jonathan Melendez MRN: [id number] DOB:17-Mar-1970, 51 y.o., male Today's Date: 01/29/2022  PCP: Percell Belt, DO  REFERRING PROVIDER:  Callie Fielding, MD   END OF SESSION:   PT End of Session - 01/29/22 1004     Visit Number 6    Number of Visits 8    Date for PT Re-Evaluation 02/02/22    PT Start Time 0930    PT Stop Time 1008    PT Time Calculation (min) 38 min    Activity Tolerance Patient tolerated treatment well;Patient limited by pain    Behavior During Therapy [facility] for tasks assessed/performed             Past Medical History:  Diagnosis Date   Ankylosing spondylitis ([place])    Diabetes mellitus without complication ([place])    Dyslipidemia    Hypertension    Pancreatitis    Past Surgical History:  Procedure Laterality Date   CYSTOSCOPY/URETEROSCOPY/HOLMIUM LASER/STENT PLACEMENT Right 05/25/2021   Procedure: CYSTOSCOPY/URETEROSCOPY/HOLMIUM LASER/STENT PLACEMENT;  Surgeon: Festus Aloe, MD;  Location: [facility];  Service: Urology;  Laterality: Right;   TONSILLECTOMY     VASECTOMY     VASECTOMY REVERSAL     Patient Active Problem List   Diagnosis Date Noted   Nephrolithiasis 05/25/2021   Hydronephrosis of right kidney 05/24/2021   Overweight (BMI 25.0-29.9) 05/24/2021   Type 2 diabetes mellitus with hyperglycemia ([place]) 05/24/2021   Pulmonary nodule 05/24/2021   Sinus bradycardia 05/24/2021   Urolithiasis 05/24/2021   Acute pancreatitis 06/03/2020   Abdominal pain 06/01/2020   Hypertension    Dyslipidemia    Diabetes mellitus without complication ([place])    Ankylosing spondylitis ([place])    Essential hypertension 07/24/2019   H/O non-insulin dependent diabetes mellitus 07/24/2019   Hyperlipidemia 07/24/2019   Chest pain of uncertain etiology 75/44/9201    REFERRING DIAG: M54.12 (ICD-10-CM) - Radiculopathy, cervical region   THERAPY DIAG:  Abnormal posture  Muscle weakness  (generalized)  Radiculopathy, cervical region  Rationale for Evaluation and Treatment Rehabilitation  PERTINENT HISTORY: Ankylosing spondylitis, diabetes mellitus and hypertension   PRECAUTIONS: none  SUBJECTIVE:                                                                                                                                                                                      SUBJECTIVE STATEMENT: Pt reporting pain and radiculopathy is about the same overall, 7-8/10 in neck and down arms.  PAIN:  Are you having pain? Yes, see above statement   OBJECTIVE: (objective measures completed at initial evaluation unless otherwise dated)  OBJECTIVE:    DIAGNOSTIC FINDINGS:  IMPRESSION: 1. Broad-based right paracentral disc osteophyte complex at C6-7 with secondary flattening of the right hemi cord, with resultant moderate spinal stenosis, with severe right and moderate left C7 foraminal narrowing. This is suspected to be the symptomatic level. 2. Degenerative spondylosis at C5-6 with resultant moderate spinal stenosis, with moderate right worse than left C6 foraminal narrowing. 3. Severe left C5 foraminal stenosis related to uncovertebral and facet disease.     PATIENT SURVEYS:  FOTO 50 (Goal 64 in 11 visits)   COGNITION: Overall cognitive status: Within functional limits for tasks assessed   SENSATION: Significant for constant Rt UE pain and paresthesias and intermittent Lt upper extremity pain and paresthesias, particularly of the 1st-3rd digit bilaterally   POSTURE: rounded shoulders, forward head, and decreased lumbar lordosis                 CERVICAL ROM:    Active ROM A/PROM (deg) 01/05/2022  Flexion    Extension 35  Right lateral flexion 15  Left lateral flexion 30  Right rotation 15  Left rotation 45   (Blank rows = not tested)   UPPER EXTREMITY ROM:   Active ROM Right eval Left eval  Shoulder flexion      Shoulder extension       Shoulder abduction      Shoulder adduction      Shoulder extension      Shoulder internal rotation      Shoulder external rotation      Elbow flexion      Elbow extension      Wrist flexion      Wrist extension      Wrist ulnar deviation      Wrist radial deviation      Wrist pronation      Wrist supination       (Blank rows = not tested)   STRENGTH:   In pounds assessed by hand-held dynamometer Right 01/05/2022 Left 01/05/2022  Shoulder flexion      Shoulder extension      Shoulder abduction      Shoulder adduction      Shoulder extension      Shoulder internal rotation      Shoulder external rotation      Middle trapezius      Lower trapezius      Elbow flexion      Elbow extension      Wrist flexion      Wrist extension      Wrist ulnar deviation      Wrist radial deviation      Wrist pronation      Cervical lateral bending 0.0 pounds 4.9 pounds  Cervical extension 3.1 pounds     (Blank rows = not tested)     TODAY'S TREATMENT:  01/29/22 Mechanical Traction:  28# static traction for 22 minutes Therex Bilat horizontal abd green 2X10 Bilat shoulder ER green 2X10 Cervical isometric extensions 5 sec X 10 Doorway stretch bilat shoulder extension 5 sec  x10  01/23/22:  Mechanical Traction:  28# static traction for 22 minutes  01/17/2022 Cervical traction static 29 pounds for 12 minutes  Shoulder blade pinches 10X 5 seconds Cervical Isometrics Extension 10X 5 seconds Rows/Pull to chest Blue 20X 3 seconds  UBE Pull only :30 Pull/:30 Rest for 8 minutes Level 4.5 Target 30 RPM   01/15/2022 Cervical traction static 25-28 pounds for 12 minutes  Shoulder blade pinches 10X 5 seconds Cervical Isometrics Extension 10X 5 seconds Rows/Pull  to chest Blue 20X 3 seconds Shoulder ER theraband Green 10X Bil 3 seconds Shoulder extension theraband    01/10/22:  TherEx:  Cervical retraction chin tuck: x 5 holding 5 sec Cervical extension with strap: x 5 holding  5 sec Rows: Level 3 band x 15 holding 3 sec Cervical rotation: x 5 each side holding 5 sec UBE: Level 2 2 minutes each direction Mechanical Traction:  12-18 pound pull, cervical protocol x 20 minutes   PATIENT EDUCATION:  Education details: See above Person educated: Patient Education method: Explanation, Demonstration, Tactile cues, Verbal cues, and Handouts Education comprehension: verbalized understanding, returned demonstration, verbal cues required, tactile cues required, and needs further education   HOME EXERCISE PROGRAM: Access Code: [email or web address] URL: [email or web address] Date: 01/17/2022 Prepared by: Vista Mink  Exercises - Standing Isometric Cervical Extension with Manual Resistance  - 5 x daily - 7 x weekly - 1 sets - 5 reps - 5 hold - Standing Scapular Retraction  - 3-5 x daily - 7 x weekly - 1 sets - 5 reps - 5 second hold - Seated Cervical Rotation AROM  - 3 x daily - 7 x weekly - 5 reps - 5 seconds hold - Upper Cervical Extension SNAG with Strap  - 3 x daily - 7 x weekly - 5 reps - 5- 10 sec hold - Supine Chin Tuck  - 3 x daily - 7 x weekly - 10 reps - 5 seconds hold - Standing Row with Anchored Resistance Band with PLB  - 1 x daily - 7 x weekly - 1 sets - 20 reps - 3 seconds hold    ASSESSMENT:   CLINICAL IMPRESSION: He gets some relief from traction which was again performed today. Activity tolerance remains limited by pain.    OBJECTIVE IMPAIRMENTS: decreased activity tolerance, decreased endurance, decreased knowledge of condition, decreased ROM, decreased strength, decreased safety awareness, impaired perceived functional ability, increased muscle spasms, impaired UE functional use, improper body mechanics, postural dysfunction, and pain.    ACTIVITY LIMITATIONS: carrying, lifting, bending, sitting, standing, sleeping, stairs, and reach over head   PARTICIPATION LIMITATIONS: community activity and occupation   PERSONAL FACTORS: Time since onset  of injury/illness/exacerbation are also affecting patient's functional outcome.    REHAB POTENTIAL: Fair We will start cervical traction at his next visit along with a review of his home exercises.  Reassessment will be completed in 3 to 4 weeks to determine if further rehabilitation is indicated or whether he is to be referred back to Dr. Laurance Flatten for surgery or additional medical management.  [name] and I also discussed that he is perfectly appropriate to proceed directly to surgery if his A1C is under control and he can get in to see Dr. Laurance Flatten sooner.   CLINICAL DECISION MAKING: Stable/uncomplicated   EVALUATION COMPLEXITY: Low     GOALS: Goals reviewed with patient? Yes   SHORT TERM GOALS: Target date: 02/02/2022    Jenny Reichmann will report independence with his day 1 home exercise program Baseline: Started 01/05/2022 Goal status: Met 01/15/2022   2.  [name] will be able to demonstrate correct standing and seated postures and begin to implement this into daily activities Baseline: Extremely flexed posture at evaluation Goal status: MET 01/23/22   LONG TERM GOALS: Target date: 03/02/2022   Improve FOTO to 64 in 11 visits or less Baseline: 50 Goal status: INITIAL   2.  [name] will report neck, scapular and bilateral upper extremity pain consistently 0-3 out of 10 on the visual analog  scale (VAS) Baseline: 4-7/10 Goal status: On Going 01/15/2022   3.  Improve cervical active range of motion for rotation to 50 degrees, lateral bending to 30 degrees and extension to 60 degrees Baseline: 45/15; 30/15; and 35 Goal status: INITIAL   4.  Improve cervical strength for extension to 40 and lateral bending to 24 pounds bilaterally Baseline: Less than 5 pounds all directions assessed Goal status: INITIAL   5.  Jenny Reichmann will be independent with his long-term home exercise program at discharge Baseline: Started 01/05/2022 Goal status: On Going 01/15/2022     PLAN:   PT FREQUENCY: 2x/week   PT DURATION:  4 weeks   PLANNED INTERVENTIONS: Therapeutic exercises, Therapeutic activity, Neuromuscular re-education, Patient/Family education, Self Care, Dry Needling, Spinal mobilization, Cryotherapy, Moist heat, Traction, and Manual therapy   PLAN FOR NEXT SESSION: 28# of static traction, continue cervical and scapular strengthening, postural and body mechanics education, cervical ROM as appropriate while avoiding irritating peripheral symptoms.  Progress slowly and conservatively.  Debbe Odea, PT, DPT 01/29/2022, 10:07 AM

## 2022-01-31 ENCOUNTER — Ambulatory Visit (INDEPENDENT_AMBULATORY_CARE_PROVIDER_SITE_OTHER): Payer: BC Managed Care – PPO | Admitting: Rehabilitative and Restorative Service Providers"

## 2022-01-31 ENCOUNTER — Encounter: Payer: Self-pay | Admitting: Rehabilitative and Restorative Service Providers"

## 2022-01-31 DIAGNOSIS — R293 Abnormal posture: Secondary | ICD-10-CM

## 2022-01-31 DIAGNOSIS — M5412 Radiculopathy, cervical region: Secondary | ICD-10-CM

## 2022-01-31 DIAGNOSIS — M6281 Muscle weakness (generalized): Secondary | ICD-10-CM | POA: Diagnosis not present

## 2022-01-31 NOTE — Therapy (Signed)
OUTPATIENT PHYSICAL THERAPY TREATMENT/PROGRESS/DISCHARGE NOTE   Patient Name: Jonathan Melendez MRN: [id number] DOB:1970-07-26, 51 y.o., male Today's Date: 01/31/2022  PCP: Percell Belt, DO  REFERRING PROVIDER:  Callie Fielding, MD   END OF SESSION:   PT End of Session - 01/31/22 1255     Visit Number 7    Number of Visits 8    Date for PT Re-Evaluation 02/02/22    PT Start Time 6754    PT Stop Time 1336    PT Time Calculation (min) 41 min    Activity Tolerance Patient limited by pain;No increased pain    Behavior During Therapy WFL for tasks assessed/performed            PHYSICAL THERAPY DISCHARGE SUMMARY  Visits from Start of Care: 7  Current functional level related to goals / functional outcomes: See note   Remaining deficits: See note   Education / Equipment: HEP   Patient agrees to discharge. Patient goals were partially met. Patient is being discharged due to  being a surgical candidate when A1C is under control.   Past Medical History:  Diagnosis Date   Ankylosing spondylitis ([place])    Diabetes mellitus without complication ([place])    Dyslipidemia    Hypertension    Pancreatitis    Past Surgical History:  Procedure Laterality Date   CYSTOSCOPY/URETEROSCOPY/HOLMIUM LASER/STENT PLACEMENT Right 05/25/2021   Procedure: CYSTOSCOPY/URETEROSCOPY/HOLMIUM LASER/STENT PLACEMENT;  Surgeon: Festus Aloe, MD;  Location: [facility];  Service: Urology;  Laterality: Right;   TONSILLECTOMY     VASECTOMY     VASECTOMY REVERSAL     Patient Active Problem List   Diagnosis Date Noted   Nephrolithiasis 05/25/2021   Hydronephrosis of right kidney 05/24/2021   Overweight (BMI 25.0-29.9) 05/24/2021   Type 2 diabetes mellitus with hyperglycemia ([place]) 05/24/2021   Pulmonary nodule 05/24/2021   Sinus bradycardia 05/24/2021   Urolithiasis 05/24/2021   Acute pancreatitis 06/03/2020   Abdominal pain 06/01/2020   Hypertension    Dyslipidemia    Diabetes mellitus  without complication ([place])    Ankylosing spondylitis ([place])    Essential hypertension 07/24/2019   H/O non-insulin dependent diabetes mellitus 07/24/2019   Hyperlipidemia 07/24/2019   Chest pain of uncertain etiology 49/20/1007    REFERRING DIAG: M54.12 (ICD-10-CM) - Radiculopathy, cervical region   THERAPY DIAG:  Abnormal posture  Muscle weakness (generalized)  Radiculopathy, cervical region  Rationale for Evaluation and Treatment Rehabilitation  PERTINENT HISTORY: Ankylosing spondylitis, diabetes mellitus and hypertension   PRECAUTIONS: none  SUBJECTIVE:  SUBJECTIVE STATEMENT: Jonathan Melendez reports symptoms have changed very little.  UE symptoms are still severely limiting function.  He has been out of work for 3 weeks because of his arm symptoms.  Jonathan Melendez notes he is now having difficulty driving and he is considering being a passenger only because of the strength in his right hand.  PAIN:  Are you having pain? Yes, see above statement   OBJECTIVE: (objective measures completed at initial evaluation unless otherwise dated)  OBJECTIVE:    DIAGNOSTIC FINDINGS:  IMPRESSION: 1. Broad-based right paracentral disc osteophyte complex at C6-7 with secondary flattening of the right hemi cord, with resultant moderate spinal stenosis, with severe right and moderate left C7 foraminal narrowing. This is suspected to be the symptomatic level. 2. Degenerative spondylosis at C5-6 with resultant moderate spinal stenosis, with moderate right worse than left C6 foraminal narrowing. 3. Severe left C5 foraminal stenosis related to uncovertebral and facet disease.     PATIENT SURVEYS:  01/31/2022 FOTO 48 (Goal 64)  01/05/2022 FOTO 50 (Goal 64 in 11 visits)   COGNITION: Overall cognitive status: Within functional  limits for tasks assessed   SENSATION: Significant for constant Rt UE pain and paresthesias and intermittent Lt upper extremity pain and paresthesias, particularly of the 1st-3rd digit bilaterally   POSTURE: rounded shoulders, forward head, and decreased lumbar lordosis                 CERVICAL ROM:    Active ROM A/PROM (deg) 01/05/2022 AROM (deg) 01/31/2022  Flexion     Extension 35 30  Right lateral flexion 15 10  Left lateral flexion 30 25  Right rotation 15 20  Left rotation 45 40   (Blank rows = not tested)   UPPER EXTREMITY ROM:   Active ROM Right eval Left eval  Shoulder flexion      Shoulder extension      Shoulder abduction      Shoulder adduction      Shoulder extension      Shoulder internal rotation      Shoulder external rotation      Elbow flexion      Elbow extension      Wrist flexion      Wrist extension      Wrist ulnar deviation      Wrist radial deviation      Wrist pronation      Wrist supination       (Blank rows = not tested)   STRENGTH:   In pounds assessed by hand-held dynamometer Right 01/05/2022 Left 01/05/2022 Left/Right 01/31/2022   Shoulder flexion        Shoulder extension        Shoulder abduction        Shoulder adduction        Shoulder extension        Shoulder internal rotation        Shoulder external rotation        Middle trapezius        Grip strength     54.6 pounds/25.5 pounds   Elbow flexion        Elbow extension        Wrist flexion        Wrist extension        Wrist ulnar deviation        Wrist radial deviation        Wrist pronation        Cervical lateral bending 0.0 pounds 4.9  pounds 0.0/0.0 pounds   Cervical extension 3.1 pounds   3.6 pounds    (Blank rows = not tested)     TODAY'S TREATMENT:  01/31/2022 Mechanical Traction 20-28# static for 15 minutes  Shoulder blade pinches 10X 5 seconds Cervical Isometrics Extension 10X 5 seconds Rows/Pull to chest Blue 20X 3  seconds  Reassessment/progress note completed along with HEP review and DC instructions   01/29/22 Mechanical Traction:  28# static traction for 22 minutes Therex Bilat horizontal abd green 2X10 Bilat shoulder ER green 2X10 Cervical isometric extensions 5 sec X 10 Doorway stretch bilat shoulder extension 5 sec  x10   01/23/22:  Mechanical Traction:  28# static traction for 22 minutes   PATIENT EDUCATION:  Education details: See above Person educated: Patient Education method: Explanation, Demonstration, Tactile cues, Verbal cues, and Handouts Education comprehension: verbalized understanding, returned demonstration, verbal cues required, tactile cues required, and needs further education   HOME EXERCISE PROGRAM: Access Code: [email or web address] URL: [email or web address] Date: 01/17/2022 Prepared by: Vista Mink  Exercises - Standing Isometric Cervical Extension with Manual Resistance  - 5 x daily - 7 x weekly - 1 sets - 5 reps - 5 hold - Standing Scapular Retraction  - 3-5 x daily - 7 x weekly - 1 sets - 5 reps - 5 second hold - Seated Cervical Rotation AROM  - 3 x daily - 7 x weekly - 5 reps - 5 seconds hold - Upper Cervical Extension SNAG with Strap  - 3 x daily - 7 x weekly - 5 reps - 5- 10 sec hold - Supine Chin Tuck  - 3 x daily - 7 x weekly - 10 reps - 5 seconds hold - Standing Row with Anchored Resistance Band with PLB  - 1 x daily - 7 x weekly - 1 sets - 20 reps - 3 seconds hold    ASSESSMENT:   CLINICAL IMPRESSION: Jonathan Melendez is doing everything that I would expect to help reduce nerve root pressure and help peripheral pain.  Despite his excellent compliance with his HEP and an appropriate rehabilitation program, symptoms are at best the same and possibly worse (out of work for 3 weeks and considering giving up driving due to right hand weakness).  Due to a lack of significant subjective, objective and functional progress, I recommend Jonathan Melendez follow-up with Dr. Laurance Flatten  for further medical management.  At this point, further supervised PT is not recommended.   OBJECTIVE IMPAIRMENTS: decreased activity tolerance, decreased endurance, decreased knowledge of condition, decreased ROM, decreased strength, decreased safety awareness, impaired perceived functional ability, increased muscle spasms, impaired UE functional use, improper body mechanics, postural dysfunction, and pain.    ACTIVITY LIMITATIONS: carrying, lifting, bending, sitting, standing, sleeping, stairs, and reach over head   PARTICIPATION LIMITATIONS: community activity and occupation   PERSONAL FACTORS: Time since onset of injury/illness/exacerbation are also affecting patient's functional outcome.    REHAB POTENTIAL: Fair We will start cervical traction at his next visit along with a review of his home exercises.  Reassessment will be completed in 3 to 4 weeks to determine if further rehabilitation is indicated or whether he is to be referred back to Dr. Laurance Flatten for surgery or additional medical management.  Jonathan Melendez and I also discussed that he is perfectly appropriate to proceed directly to surgery if his A1C is under control and he can get in to see Dr. Laurance Flatten sooner.   CLINICAL DECISION MAKING: Stable/uncomplicated   EVALUATION COMPLEXITY: Low     GOALS: Goals reviewed  with patient? Yes   SHORT TERM GOALS: Target date: 02/02/2022    Jonathan Melendez will report independence with his day 1 home exercise program Baseline: Started 01/05/2022 Goal status: Met 01/15/2022   2.  Jonathan Melendez will be able to demonstrate correct standing and seated postures and begin to implement this into daily activities Baseline: Extremely flexed posture at evaluation Goal status: MET 01/23/22   LONG TERM GOALS: Target date: 03/02/2022   Improve FOTO to 64 in 11 visits or less Baseline: 50 Goal status: On Going 01/31/2022   2.  Jonathan Melendez will report neck, scapular and bilateral upper extremity pain consistently 0-3 out of 10 on the  visual analog scale (VAS) Baseline: 4-7/10 Goal status: On Going 01/31/2022   3.  Improve cervical active range of motion for rotation to 50 degrees, lateral bending to 30 degrees and extension to 60 degrees Baseline: 45/15; 30/15; and 35 Goal status: On Going 01/31/2022   4.  Improve cervical strength for extension to 40 and lateral bending to 24 pounds bilaterally Baseline: Less than 5 pounds all directions assessed Goal status: On Going 01/31/2022   5.  Jonathan Melendez will be independent with his long-term home exercise program at discharge Baseline: Started 01/05/2022 Goal status: Met 01/31/2022     PLAN:   PT FREQUENCY: DC   PT DURATION: DC   PLANNED INTERVENTIONS: Therapeutic exercises, Therapeutic activity, Neuromuscular re-education, Patient/Family education, Self Care, Dry Needling, Spinal mobilization, Cryotherapy, Moist heat, Traction, and Manual therapy   PLAN FOR NEXT SESSION: DC  Farley Ly, PT, MPT 01/31/2022, 1:43 PM

## 2022-02-01 ENCOUNTER — Telehealth: Payer: Self-pay | Admitting: Orthopedic Surgery

## 2022-02-01 NOTE — Telephone Encounter (Signed)
Orthopedic Telephone Note  Patient is a 51 year old male that I previously seen in the office for neck pain that radiates into bilateral upper extremities.  At her last visit, he had symptoms consistent with radiculopathy but some that were possibly due to myelopathy.  It seemed more like a radiculopathy at that visit. He has had a hard time getting his A1c down with his last one that I can see being 9 on 12/25/2021.  I told him I should probably reexamine him again since it has been awhile since I have seen him.  If I feel that his symptoms and exam have become more consistent with myelopathy we would proceed with C5-7 ACDF as previously planned in spite of his A1c being elevated with the understanding that his risk of complication is significantly higher.  However, if it is radiculopathy then we would continue to optimize and try to get his A1c down to 7.5 which would be in line with CNS recommendations.  He expressed understanding of the plan.  I told him I would see him tomorrow morning (02/02/2022).  He is going to come tomorrow to the office and I will see him to reexamine him and determine our next steps in treatment.   Callie Fielding, MD Orthopedic Surgeon

## 2022-02-02 ENCOUNTER — Telehealth: Payer: Self-pay | Admitting: Orthopedic Surgery

## 2022-02-02 ENCOUNTER — Encounter: Payer: Self-pay | Admitting: Radiology

## 2022-02-02 ENCOUNTER — Ambulatory Visit (INDEPENDENT_AMBULATORY_CARE_PROVIDER_SITE_OTHER): Payer: BC Managed Care – PPO | Admitting: Orthopedic Surgery

## 2022-02-02 DIAGNOSIS — G549 Nerve root and plexus disorder, unspecified: Secondary | ICD-10-CM

## 2022-02-02 NOTE — Telephone Encounter (Signed)
Patient submitted disability forms, please advise work status and provide note. Thank you

## 2022-02-02 NOTE — Progress Notes (Addendum)
Orthopedic Spine Surgery Office Note  Assessment: Patient is a 51 y.o. male with neck pain that radiates into bilateral upper extremities.  mJOa score of 12. MRI shows central and foraminal stenosis at C5/6 and C6/7.   Plan: -We discussed the progression of his symptoms since the last time I saw him and today his symptoms seem consistent with myelopathy especially his hand dexterity issues and hand numbness bilaterally.  I had encouraged him to work on his blood sugar control.  His last A1c was 9.  I explained that his risks of surgery are higher with this A1c.  However, because of the natural history of myelopathy (which I explained to him is a stepwise decline), we could proceed with surgery to prevent further functional decline.  He would have to understand that his risk of surgery particular infection would be higher.  He expressed understanding of this elevated risk due to his A1C. -I discussed his foraminal stenosis on the left at C4-5.  Since he his not having pain in a distribution consistent with that stenosis, we decided not to extend the ACDF to that level. -Patient will next be seen at the date of surgery   The patient has signs and symptoms consistent with cervical myelopathy. The most common natural history of cervical myelopathy was explained to the patient. Given this course, operative management in the form of C5-7 anterior cervical discectomy and fusion was recommended to the patient. The risks, including but not limited to pseudarthrosis, dysphagia, hematoma, airway compromise, recurrent laryngeal nerve injury, esophageal perforation, durotomy, spinal cord injury, nerve root injury, persistent pain, adjacent segment disease, infection, bleeding, hardware failure, vascular injury, heart attack, death, stroke, fracture, and need for additional procedures were discussed with the patient. The benefit of surgery would be preventing progression of the myelopathy and not to reverse any  myelopathic symptoms. Explained that the patient the surgery would likely help give him relief of the radicular type pain that radiates into his arms, but likely will not help with any neck pain. The alternatives to surgical management would be continued monitoring, physical therapy, over-the-counter pain medications, ambulatory aids, and activity modification. I reiterated that these modalities would not change the natural history of the disease and that is why operative management has been recommended. I discussed that his A1C is elevated so his risks of surgery, particularly infection are higher because of this, but because this is myelopathy, we can proceed. He understood that his risks were higher due to his A1C. All the patient's questions were answered to their satisfaction. After this discussion, the patient expressed understanding and elected to proceed with surgical intervention.     __________________________________________________________________________  History: Patient is a 51 y.o. male who has been previously seen in the office at the end of October.  At that time, he seemed to have signs and symptoms consistent with radiculopathy but did have some concerns about possible myelopathy.  I had him come back to the office for repeat evaluation today.  He states that his symptoms have gotten worse since the last time I saw him.  He is now having bilateral decreased sensation in his hands.  He is having trouble with fine motor skills in his hands.  He has trouble typing and has involuntarily dropped objects.  He still notes neck pain that shoots down his right arm.  He gets numbness and tingling down the right arm as well.  He gets some pain and numbness and tingling on the left arm but is  not as significant as the right arm.  His pain radiates down the lateral aspect of the arm into the thumb, index, and long fingers on the right side. On the left side, it radiates down the from the elbow into the  thumb, index, and long fingers. He feels the pain on a daily basis.  Has not had any issues with unsteadiness in his lower extremities.  Feels his arms are getting weaker particularly his bicep on the right since the last time I saw him.   Previous treatments: NSAIDs, Tylenol, steroid injections, oral steroids, activity modification   COPY OF FIRST NOTE Patient is a 51 y.o. male who presents today for cervical spine.  Patient has had several years of pain in his neck that radiates into his bilateral arms.  Feels it goes down the right arm on the lateral aspect into the thumb, index, and long fingers.  On the left side, he feels that from the elbow down radiating into his thumb, index, and long finger.  In the past, the pain has subsided with time.  This time the pain has been constant and is getting progressively worse.  He feels that both of his hands are going numb and he is having trouble doing fine motor skills due to the decreased sensation in his hands.     Weakness: Denies Difficulty with fine motor skills (e.g., buttoning shirts, handwriting): Yes Symptoms of imbalance: Denies Paresthesias and numbness: Yes in C6 and C7 distributions bilaterally Bowel or bladder incontinence: Denies Saddle anesthesia: Denies END OF COPY  Physical Exam:  BMI 27.4  General: no acute distress, appears stated age Neurologic: alert, answering questions appropriately, following commands Respiratory: unlabored breathing on room air, symmetric chest rise Psychiatric: appropriate affect, normal cadence to speech   MSK (spine):  -Strength exam      Left  Right Grip strength                5/5  5/5 Interosseus   5/5   5/5 Wrist extension  5/5  5/5 Wrist flexion   5/5  5/5 Elbow flexion   5/5  4/5 Deltoid    5/5  4+/5  EHL    5/5  5/5 TA    5/5  5/5 GSC    5/5  5/5 Knee extension  5/5  5/5 Hip flexion   5/5  5/5  -Sensory exam    Sensation intact to light touch in L3-S1 nerve distributions  of bilateral lower extremities  Sensation intact to light touch in C5-T1 nerve distributions of bilateral upper extremities (decreased in bilateral hands)  -Brachioradialis DTR: 3/4 on the left, 2/4 on the right -Biceps DTR: 2/4 on the left, 2/4 on the right -Triceps DTR: 2/4 on the left, 2/4 on the right -Achilles DTR: 2/4 on the left, 2/4 on the right -Patellar tendon DTR: 2/4 on the left, 2/4 on the right  -Spurling: positive on the right, negative on the left -Hoffman sign: negative bilaterally -Clonus: no beats bilaterally -Interosseous wasting: none seen -Grip and release test: positive -Romberg: negative -Gait: normal -Imbalance with tandem gait: no  Left shoulder exam: No pain through range of motion, negative Jobe, negative belly press, no weakness with external rotation with arm at side Right shoulder exam: Pain at end of external rotation otherwise no pain through range of motion, negative Jobe, negative belly press, no weakness with external rotation with arm at side  Tinel's at wrist: negative bilaterally Phalen's at wrist: negative bilaterally Durkan's: negative  bilaterally  Tinel's at elbow: negative bilaterally  Imaging: XR of the cervical spine from 12/18/2021 was previously independently reviewed and interpreted, showing neutral alignment. Disc height loss and anterior osteophyte formation at C5/6 and C6/7. No fracture or dislocation. No evidence of instability on flexion/extension views.   MRI of the cervical spine from 12/03/2021 was previously independently reviewed and interpreted, showing disc bulges at C5-6 and C6-7 resulting in foraminal stenosis bilaterally and central stenosis. Foraminal stenosis is worse on the right at C6/7. Has foraminal stenosis at C4/5 on the left.    Patient name: Jonathan Melendez Patient MRN: [id number] Date of visit: 02/02/22

## 2022-02-05 ENCOUNTER — Encounter: Payer: BC Managed Care – PPO | Admitting: Physical Therapy

## 2022-02-08 ENCOUNTER — Encounter: Payer: BC Managed Care – PPO | Admitting: Rehabilitative and Restorative Service Providers"

## 2022-02-14 DIAGNOSIS — E119 Type 2 diabetes mellitus without complications: Secondary | ICD-10-CM | POA: Diagnosis not present

## 2022-02-14 DIAGNOSIS — E785 Hyperlipidemia, unspecified: Secondary | ICD-10-CM | POA: Diagnosis not present

## 2022-02-14 DIAGNOSIS — I10 Essential (primary) hypertension: Secondary | ICD-10-CM | POA: Diagnosis not present

## 2022-02-14 DIAGNOSIS — Z01818 Encounter for other preprocedural examination: Secondary | ICD-10-CM | POA: Diagnosis not present

## 2022-02-16 ENCOUNTER — Ambulatory Visit: Payer: BC Managed Care – PPO | Admitting: Orthopedic Surgery

## 2022-02-17 ENCOUNTER — Other Ambulatory Visit: Payer: Self-pay | Admitting: Orthopedic Surgery

## 2022-02-20 MED ORDER — PREGABALIN 50 MG PO CAPS: 50.0000 mg | ORAL_CAPSULE | Freq: Three times a day (TID) | ORAL | 0 refills | Status: DC

## 2022-02-21 DIAGNOSIS — D72829 Elevated white blood cell count, unspecified: Secondary | ICD-10-CM | POA: Diagnosis not present

## 2022-03-06 NOTE — Pre-Procedure Instructions (Signed)
Surgical Instructions    Your procedure is scheduled on Friday, January 19th.  Report to [facility] Main Entrance "A" at 10:15 A.M., then check in with the Admitting office.  Call this number if you have problems the morning of surgery:  [phone]  If you have any questions prior to your surgery date call [phone]: Open Monday-Friday 8am-4pm If you experience any cold or flu symptoms such as cough, fever, chills, shortness of breath, etc. between now and your scheduled surgery, please notify us at the above number.     Remember:  Do not eat after midnight the night before your surgery  You may drink clear liquids until 09:15 AM the morning of your surgery.   Clear liquids allowed are: Water, Non-Citrus Juices (without pulp), Carbonated Beverages, Clear Tea, Black Coffee Only (NO MILK, CREAM OR POWDERED CREAMER of any kind), and Gatorade.   Patient Instructions  The night before surgery:  No food after midnight. ONLY clear liquids after midnight   The day of surgery (if you have diabetes): Drink ONE (1) 12 oz G2 given to you in your pre admission testing appointment by 09:15 AM the morning of surgery. Drink in one sitting. Do not sip.  This drink was given to you during your hospital  pre-op appointment visit.  Nothing else to drink after completing the  12 oz bottle of G2.         If you have questions, please contact your surgeon's office.     Take these medicines the morning of surgery with A SIP OF WATER  amLODipine (NORVASC)  atorvastatin (LIPITOR)  metoprolol succinate (TOPROL-XL)  pregabalin (LYRICA)    If needed: acetaminophen (TYLENOL)  nitroGLYCERIN (NITROSTAT)     As of today, STOP taking any Aspirin (unless otherwise instructed by your surgeon) Aleve, Naproxen, Ibuprofen, Motrin, Advil, Goody's, BC's, all herbal medications, fish oil, and all vitamins.   WHAT DO I DO ABOUT MY DIABETES MEDICATION?   Do not take metFORMIN (GLUCOPHAGE-XR)  the  morning of surgery.  If taken at night time, take 10 units (50%) of insulin glargine (LANTUS SOLOSTAR) the night before surgery 1/18. If taken in the morning, take 10 units (50%) of insulin glargine (LANTUS SOLOSTAR)  the morning of surgery 1/19.     HOW TO MANAGE YOUR DIABETES BEFORE AND AFTER SURGERY  Why is it important to control my blood sugar before and after surgery? Improving blood sugar levels before and after surgery helps healing and can limit problems. A way of improving blood sugar control is eating a healthy diet by:  Eating less sugar and carbohydrates  Increasing activity/exercise  Talking with your doctor about reaching your blood sugar goals High blood sugars (greater than 180 mg/dL) can raise your risk of infections and slow your recovery, so you will need to focus on controlling your diabetes during the weeks before surgery. Make sure that the doctor who takes care of your diabetes knows about your planned surgery including the date and location.  How do I manage my blood sugar before surgery? Check your blood sugar at least 4 times a day, starting 2 days before surgery, to make sure that the level is not too high or low.  Check your blood sugar the morning of your surgery when you wake up and every 2 hours until you get to the Short Stay unit.  If your blood sugar is less than 70 mg/dL, you will need to treat for low blood sugar: Do not take insulin.  Treat a low blood sugar (less than 70 mg/dL) with  cup of clear juice (cranberry or apple), 4 glucose tablets, OR glucose gel. Recheck blood sugar in 15 minutes after treatment (to make sure it is greater than 70 mg/dL). If your blood sugar is not greater than 70 mg/dL on recheck, call [phone] for further instructions. Report your blood sugar to the short stay nurse when you get to Short Stay.  If you are admitted to the hospital after surgery: Your blood sugar will be checked by the staff and you will probably  be given insulin after surgery (instead of oral diabetes medicines) to make sure you have good blood sugar levels. The goal for blood sugar control after surgery is 80-180 mg/dL.                     Do NOT Smoke (Tobacco/Vaping) for 24 hours prior to your procedure.  If you use a CPAP at night, you may bring your mask/headgear for your overnight stay.   Contacts, glasses, piercing's, hearing aid's, dentures or partials may not be worn into surgery, please bring cases for these belongings.    For patients admitted to the hospital, discharge time will be determined by your treatment team.   Patients discharged the day of surgery will not be allowed to drive home, and someone needs to stay with them for 24 hours.  SURGICAL WAITING ROOM VISITATION Patients having surgery or a procedure may have no more than 2 support people in the waiting area - these visitors may rotate.   Children under the age of 95 must have an adult with them who is not the patient. If the patient needs to stay at the hospital during part of their recovery, the visitor guidelines for inpatient rooms apply. Pre-op nurse will coordinate an appropriate time for 1 support person to accompany patient in pre-op.  This support person may not rotate.   Please refer to the [facility] website for the visitor guidelines for Inpatients (after your surgery is over and you are in a regular room).    Special instructions:   [facility]- Preparing For Surgery  Before surgery, you can play an important role. Because skin is not sterile, your skin needs to be as free of germs as possible. You can reduce the number of germs on your skin by washing with CHG (chlorahexidine gluconate) Soap before surgery.  CHG is an antiseptic cleaner which kills germs and bonds with the skin to continue killing germs even after washing.    Oral Hygiene is also important to reduce your risk of infection.  Remember - BRUSH YOUR TEETH THE MORNING OF SURGERY  WITH YOUR REGULAR TOOTHPASTE  Please do not use if you have an allergy to CHG or antibacterial soaps. If your skin becomes reddened/irritated stop using the CHG.  Do not shave (including legs and underarms) for at least 48 hours prior to first CHG shower. It is OK to shave your face.  Please follow these instructions carefully.   Shower the NIGHT BEFORE SURGERY and the MORNING OF SURGERY  If you chose to wash your hair, wash your hair first as usual with your normal shampoo.  After you shampoo, rinse your hair and body thoroughly to remove the shampoo.  Use CHG Soap as you would any other liquid soap. You can apply CHG directly to the skin and wash gently with a scrungie or a clean washcloth.   Apply the CHG Soap to your body  ONLY FROM THE NECK DOWN.  Do not use on open wounds or open sores. Avoid contact with your eyes, ears, mouth and genitals (private parts). Wash Face and genitals (private parts)  with your normal soap.   Wash thoroughly, paying special attention to the area where your surgery will be performed.  Thoroughly rinse your body with warm water from the neck down.  DO NOT shower/wash with your normal soap after using and rinsing off the CHG Soap.  Pat yourself dry with a CLEAN TOWEL.  Wear CLEAN PAJAMAS to bed the night before surgery  Place CLEAN SHEETS on your bed the night before your surgery  DO NOT SLEEP WITH PETS.   Day of Surgery: Take a shower with CHG soap. Do not wear jewelry  Do not wear lotions, powders, colognes, or deodorant. Men may shave face and neck. Do not bring valuables to the hospital. [facility] is not responsible for any belongings or valuables.  Wear Clean/Comfortable clothing the morning of surgery Remember to brush your teeth WITH YOUR REGULAR TOOTHPASTE.   Please read over the following fact sheets that you were given.    If you received a COVID test during your pre-op visit  it is requested that you wear a mask when out in  public, stay away from anyone that may not be feeling well and notify your surgeon if you develop symptoms. If you have been in contact with anyone that has tested positive in the last 10 days please notify you surgeon.

## 2022-03-07 ENCOUNTER — Encounter (HOSPITAL_COMMUNITY): Payer: Self-pay

## 2022-03-07 ENCOUNTER — Other Ambulatory Visit: Payer: Self-pay

## 2022-03-07 ENCOUNTER — Encounter (HOSPITAL_COMMUNITY)
Admission: RE | Admit: 2022-03-07 | Discharge: 2022-03-07 | Disposition: A | Discharge status: Home / Self Care | Payer: BC Managed Care – PPO | Source: Ambulatory Visit | Attending: Orthopedic Surgery | Admitting: Orthopedic Surgery

## 2022-03-07 VITALS — BP 128/80 | HR 62 | Temp 97.8°F | Resp 17 | Ht 68.0 in | Wt 181.0 lb

## 2022-03-07 DIAGNOSIS — Z01818 Encounter for other preprocedural examination: Secondary | ICD-10-CM

## 2022-03-07 DIAGNOSIS — Z01812 Encounter for preprocedural laboratory examination: Secondary | ICD-10-CM | POA: Diagnosis not present

## 2022-03-07 HISTORY — DX: Personal history of urinary calculi: Z87.442

## 2022-03-07 LAB — SURGICAL PCR SCREEN
MRSA, PCR: NEGATIVE
Staphylococcus aureus: NEGATIVE

## 2022-03-07 LAB — GLUCOSE, CAPILLARY: Glucose-Capillary: 114 mg/dL — ABNORMAL HIGH (ref 70–99)

## 2022-03-07 NOTE — Progress Notes (Signed)
PCP - Irene Pap, DO Cardiologist - Denies  PPM/ICD - Denies  Chest x-ray - 07/06/2019 EKG - 05/25/2021 Stress Test - Denies ECHO - 08/04/2019 Cardiac Cath - Denies  Sleep Study - Denies  DM: Type II Fasting Blood Sugar - 150-160 Checks Blood Sugar multiple times a day "continuous monitor"   Blood Thinner Instructions: N/A Aspirin Instructions: N/A  ERAS Protcol - Yes PRE-SURGERY Ensure or G2-  G2  COVID TEST-  No   Anesthesia review: No  Patient denies shortness of breath, fever, cough and chest pain at PAT appointment   All instructions explained to the patient, with a verbal understanding of the material. Patient agrees to go over the instructions while at home for a better understanding. The opportunity to ask questions was provided.

## 2022-03-09 ENCOUNTER — Ambulatory Visit (HOSPITAL_COMMUNITY): Payer: BC Managed Care – PPO | Admitting: Certified Registered Nurse Anesthetist

## 2022-03-09 ENCOUNTER — Encounter (HOSPITAL_COMMUNITY): Payer: Self-pay | Admitting: Orthopedic Surgery

## 2022-03-09 ENCOUNTER — Ambulatory Visit (HOSPITAL_COMMUNITY): Payer: BC Managed Care – PPO

## 2022-03-09 ENCOUNTER — Ambulatory Visit (HOSPITAL_COMMUNITY)
Admission: RE | Disposition: A | Discharge status: Home / Self Care | Payer: Self-pay | Source: Home / Self Care | Attending: Orthopedic Surgery

## 2022-03-09 ENCOUNTER — Other Ambulatory Visit: Payer: Self-pay

## 2022-03-09 ENCOUNTER — Observation Stay (HOSPITAL_COMMUNITY)
Admission: RE | Admit: 2022-03-09 | Discharge: 2022-03-10 | Disposition: A | Discharge status: Home / Self Care | Payer: BC Managed Care – PPO | Attending: Orthopedic Surgery | Admitting: Orthopedic Surgery

## 2022-03-09 DIAGNOSIS — G959 Disease of spinal cord, unspecified: Secondary | ICD-10-CM | POA: Insufficient documentation

## 2022-03-09 DIAGNOSIS — E871 Hypo-osmolality and hyponatremia: Secondary | ICD-10-CM | POA: Insufficient documentation

## 2022-03-09 DIAGNOSIS — M50123 Cervical disc disorder at C6-C7 level with radiculopathy: Secondary | ICD-10-CM | POA: Diagnosis not present

## 2022-03-09 DIAGNOSIS — M5412 Radiculopathy, cervical region: Secondary | ICD-10-CM | POA: Insufficient documentation

## 2022-03-09 DIAGNOSIS — M4322 Fusion of spine, cervical region: Secondary | ICD-10-CM | POA: Diagnosis not present

## 2022-03-09 DIAGNOSIS — M4802 Spinal stenosis, cervical region: Principal | ICD-10-CM | POA: Diagnosis present

## 2022-03-09 DIAGNOSIS — G549 Nerve root and plexus disorder, unspecified: Secondary | ICD-10-CM

## 2022-03-09 DIAGNOSIS — Z4682 Encounter for fitting and adjustment of non-vascular catheter: Secondary | ICD-10-CM | POA: Diagnosis not present

## 2022-03-09 DIAGNOSIS — E1165 Type 2 diabetes mellitus with hyperglycemia: Secondary | ICD-10-CM | POA: Insufficient documentation

## 2022-03-09 DIAGNOSIS — I10 Essential (primary) hypertension: Secondary | ICD-10-CM | POA: Insufficient documentation

## 2022-03-09 DIAGNOSIS — M2578 Osteophyte, vertebrae: Secondary | ICD-10-CM

## 2022-03-09 DIAGNOSIS — M50323 Other cervical disc degeneration at C6-C7 level: Secondary | ICD-10-CM

## 2022-03-09 DIAGNOSIS — M50322 Other cervical disc degeneration at C5-C6 level: Secondary | ICD-10-CM | POA: Diagnosis not present

## 2022-03-09 DIAGNOSIS — M50122 Cervical disc disorder at C5-C6 level with radiculopathy: Secondary | ICD-10-CM | POA: Diagnosis not present

## 2022-03-09 HISTORY — PX: ANTERIOR CERVICAL DECOMP/DISCECTOMY FUSION: SHX1161

## 2022-03-09 LAB — GLUCOSE, CAPILLARY
Glucose-Capillary: 168 mg/dL — ABNORMAL HIGH (ref 70–99)
Glucose-Capillary: 116 mg/dL — ABNORMAL HIGH (ref 70–99)
Glucose-Capillary: 177 mg/dL — ABNORMAL HIGH (ref 70–99)
Glucose-Capillary: 287 mg/dL — ABNORMAL HIGH (ref 70–99)

## 2022-03-09 SURGERY — ANTERIOR CERVICAL DECOMPRESSION/DISCECTOMY FUSION 2 LEVELS
Anesthesia: General | Site: Spine Cervical

## 2022-03-09 MED ORDER — ONDANSETRON HCL 4 MG PO TABS: 4.0000 mg | ORAL_TABLET | Freq: Four times a day (QID) | ORAL | Status: DC | PRN

## 2022-03-09 MED ORDER — CHLORHEXIDINE GLUCONATE 0.12 % MT SOLN: 15.0000 mL | Freq: Once | OROMUCOSAL | Status: AC

## 2022-03-09 MED ORDER — TRANEXAMIC ACID-NACL 1000-0.7 MG/100ML-% IV SOLN
1000.0000 mg | INTRAVENOUS | Status: AC
  Administered 2022-03-09: 1000 mg via INTRAVENOUS
  Filled 2022-03-09: qty 100

## 2022-03-09 MED ORDER — FENTANYL CITRATE (PF) 100 MCG/2ML IJ SOLN
INTRAMUSCULAR | Status: AC
  Filled 2022-03-09: qty 2

## 2022-03-09 MED ORDER — BACITRACIN ZINC 500 UNIT/GM EX OINT
TOPICAL_OINTMENT | CUTANEOUS | Status: AC
  Filled 2022-03-09: qty 28.35

## 2022-03-09 MED ORDER — DOCUSATE SODIUM 100 MG PO CAPS
100.0000 mg | ORAL_CAPSULE | Freq: Two times a day (BID) | ORAL | Status: DC
  Administered 2022-03-09 – 2022-03-10 (×2): 100 mg via ORAL
  Filled 2022-03-09 (×2): qty 1

## 2022-03-09 MED ORDER — THROMBIN 5000 UNITS EX SOLR
CUTANEOUS | Status: AC
  Filled 2022-03-09: qty 10000

## 2022-03-09 MED ORDER — PROPOFOL 10 MG/ML IV BOLUS
INTRAVENOUS | Status: AC
  Filled 2022-03-09: qty 20

## 2022-03-09 MED ORDER — LIDOCAINE 2% (20 MG/ML) 5 ML SYRINGE
INTRAMUSCULAR | Status: DC | PRN
  Administered 2022-03-09: 60 mg via INTRAVENOUS

## 2022-03-09 MED ORDER — SUCCINYLCHOLINE CHLORIDE 200 MG/10ML IV SOSY
PREFILLED_SYRINGE | INTRAVENOUS | Status: DC | PRN
  Administered 2022-03-09: 140 mg via INTRAVENOUS

## 2022-03-09 MED ORDER — ACETAMINOPHEN 10 MG/ML IV SOLN
1000.0000 mg | Freq: Once | INTRAVENOUS | Status: DC | PRN
  Administered 2022-03-09: 1000 mg via INTRAVENOUS

## 2022-03-09 MED ORDER — DEXAMETHASONE SODIUM PHOSPHATE 10 MG/ML IJ SOLN
10.0000 mg | Freq: Once | INTRAMUSCULAR | Status: AC
  Administered 2022-03-09: 10 mg via INTRAVENOUS
  Filled 2022-03-09: qty 1

## 2022-03-09 MED ORDER — ORAL CARE MOUTH RINSE: 15.0000 mL | Freq: Once | OROMUCOSAL | Status: AC

## 2022-03-09 MED ORDER — CHLORHEXIDINE GLUCONATE 0.12 % MT SOLN
OROMUCOSAL | Status: AC
  Administered 2022-03-09: 15 mL
  Filled 2022-03-09: qty 15

## 2022-03-09 MED ORDER — LACTATED RINGERS IV SOLN: INTRAVENOUS | Status: DC | PRN

## 2022-03-09 MED ORDER — INSULIN ASPART 100 UNIT/ML IJ SOLN: 0.0000 [IU] | INTRAMUSCULAR | Status: DC | PRN

## 2022-03-09 MED ORDER — ONDANSETRON HCL 4 MG/2ML IJ SOLN: 4.0000 mg | Freq: Four times a day (QID) | INTRAMUSCULAR | Status: DC | PRN

## 2022-03-09 MED ORDER — LACTATED RINGERS IV SOLN: INTRAVENOUS | Status: DC

## 2022-03-09 MED ORDER — ACETAMINOPHEN 500 MG PO TABS
1000.0000 mg | ORAL_TABLET | Freq: Three times a day (TID) | ORAL | Status: DC
  Administered 2022-03-10: 1000 mg via ORAL
  Filled 2022-03-09: qty 2

## 2022-03-09 MED ORDER — CEFAZOLIN SODIUM-DEXTROSE 2-4 GM/100ML-% IV SOLN
2.0000 g | INTRAVENOUS | Status: AC
  Administered 2022-03-09 (×2): 2 g via INTRAVENOUS
  Filled 2022-03-09: qty 100

## 2022-03-09 MED ORDER — FENTANYL CITRATE (PF) 250 MCG/5ML IJ SOLN
INTRAMUSCULAR | Status: DC | PRN
  Administered 2022-03-09: 150 ug via INTRAVENOUS
  Administered 2022-03-09 (×2): 50 ug via INTRAVENOUS

## 2022-03-09 MED ORDER — MULTIVITAMIN ADULT PO CHEW: CHEWABLE_TABLET | Freq: Every day | ORAL | Status: DC

## 2022-03-09 MED ORDER — POVIDONE-IODINE 10 % EX SWAB
2.0000 | Freq: Once | CUTANEOUS | Status: AC
  Administered 2022-03-09: 2 via TOPICAL

## 2022-03-09 MED ORDER — INSULIN ASPART 100 UNIT/ML IJ SOLN
INTRAMUSCULAR | Status: AC
  Administered 2022-03-09: 2 [IU] via SUBCUTANEOUS
  Filled 2022-03-09: qty 1

## 2022-03-09 MED ORDER — PROPOFOL 10 MG/ML IV BOLUS
INTRAVENOUS | Status: DC | PRN
  Administered 2022-03-09: 160 mg via INTRAVENOUS
  Administered 2022-03-09: 40 mg via INTRAVENOUS

## 2022-03-09 MED ORDER — THROMBIN 5000 UNITS EX SOLR: OROMUCOSAL | Status: DC | PRN

## 2022-03-09 MED ORDER — HYDROMORPHONE HCL 1 MG/ML IJ SOLN: 0.5000 mg | INTRAMUSCULAR | Status: DC | PRN

## 2022-03-09 MED ORDER — FENTANYL CITRATE (PF) 100 MCG/2ML IJ SOLN
25.0000 ug | INTRAMUSCULAR | Status: DC | PRN
  Administered 2022-03-09: 50 ug via INTRAVENOUS

## 2022-03-09 MED ORDER — PHENYLEPHRINE 80 MCG/ML (10ML) SYRINGE FOR IV PUSH (FOR BLOOD PRESSURE SUPPORT)
PREFILLED_SYRINGE | INTRAVENOUS | Status: DC | PRN
  Administered 2022-03-09 (×2): 160 ug via INTRAVENOUS

## 2022-03-09 MED ORDER — CEFAZOLIN SODIUM 1 G IJ SOLR
INTRAMUSCULAR | Status: AC
  Filled 2022-03-09: qty 20

## 2022-03-09 MED ORDER — OXYCODONE HCL 5 MG/5ML PO SOLN: 5.0000 mg | Freq: Once | ORAL | Status: DC | PRN

## 2022-03-09 MED ORDER — PHENYLEPHRINE HCL-NACL 20-0.9 MG/250ML-% IV SOLN
INTRAVENOUS | Status: DC | PRN
  Administered 2022-03-09: 25 ug/min via INTRAVENOUS

## 2022-03-09 MED ORDER — CEFAZOLIN SODIUM-DEXTROSE 2-4 GM/100ML-% IV SOLN
2.0000 g | Freq: Three times a day (TID) | INTRAVENOUS | Status: AC
  Administered 2022-03-09 – 2022-03-10 (×2): 2 g via INTRAVENOUS
  Filled 2022-03-09 (×2): qty 100

## 2022-03-09 MED ORDER — PROPOFOL 1000 MG/100ML IV EMUL
INTRAVENOUS | Status: AC
  Filled 2022-03-09: qty 100

## 2022-03-09 MED ORDER — AMLODIPINE BESYLATE 5 MG PO TABS
5.0000 mg | ORAL_TABLET | Freq: Every day | ORAL | Status: DC
  Administered 2022-03-10: 5 mg via ORAL
  Filled 2022-03-09: qty 1

## 2022-03-09 MED ORDER — ACETAMINOPHEN 10 MG/ML IV SOLN
INTRAVENOUS | Status: AC
  Filled 2022-03-09: qty 100

## 2022-03-09 MED ORDER — ACETAMINOPHEN 500 MG PO TABS: 1000.0000 mg | ORAL_TABLET | Freq: Once | ORAL | Status: DC | PRN

## 2022-03-09 MED ORDER — OXYCODONE HCL 5 MG PO TABS
5.0000 mg | ORAL_TABLET | ORAL | Status: DC | PRN
  Administered 2022-03-09 – 2022-03-10 (×4): 10 mg via ORAL
  Filled 2022-03-09 (×4): qty 2

## 2022-03-09 MED ORDER — MIDAZOLAM HCL 2 MG/2ML IJ SOLN
INTRAMUSCULAR | Status: AC
  Filled 2022-03-09: qty 2

## 2022-03-09 MED ORDER — PROPOFOL 500 MG/50ML IV EMUL
INTRAVENOUS | Status: DC | PRN
  Administered 2022-03-09: 75 ug/kg/min via INTRAVENOUS

## 2022-03-09 MED ORDER — FENTANYL CITRATE (PF) 250 MCG/5ML IJ SOLN
INTRAMUSCULAR | Status: AC
  Filled 2022-03-09: qty 5

## 2022-03-09 MED ORDER — SUFENTANIL CITRATE 50 MCG/ML IV SOLN
0.2500 ug/kg/h | INTRAVENOUS | Status: DC
  Filled 2022-03-09: qty 1

## 2022-03-09 MED ORDER — METOPROLOL SUCCINATE ER 50 MG PO TB24
50.0000 mg | ORAL_TABLET | Freq: Every day | ORAL | Status: DC
  Administered 2022-03-10: 50 mg via ORAL
  Filled 2022-03-09: qty 1

## 2022-03-09 MED ORDER — CHLORHEXIDINE GLUCONATE 0.12 % MT SOLN
OROMUCOSAL | Status: AC
  Filled 2022-03-09: qty 15

## 2022-03-09 MED ORDER — POLYETHYLENE GLYCOL 3350 17 G PO PACK
17.0000 g | PACK | Freq: Every day | ORAL | Status: DC
  Administered 2022-03-09 – 2022-03-10 (×2): 17 g via ORAL
  Filled 2022-03-09: qty 1

## 2022-03-09 MED ORDER — ATORVASTATIN CALCIUM 10 MG PO TABS
20.0000 mg | ORAL_TABLET | Freq: Every day | ORAL | Status: DC
  Administered 2022-03-10: 20 mg via ORAL
  Filled 2022-03-09: qty 2

## 2022-03-09 MED ORDER — METHOCARBAMOL 750 MG PO TABS
750.0000 mg | ORAL_TABLET | Freq: Four times a day (QID) | ORAL | Status: DC
  Administered 2022-03-09 – 2022-03-10 (×2): 750 mg via ORAL
  Filled 2022-03-09 (×2): qty 1

## 2022-03-09 MED ORDER — ACETAMINOPHEN 160 MG/5ML PO SOLN: 1000.0000 mg | Freq: Once | ORAL | Status: DC | PRN

## 2022-03-09 MED ORDER — ONDANSETRON HCL 4 MG/2ML IJ SOLN
INTRAMUSCULAR | Status: DC | PRN
  Administered 2022-03-09: 4 mg via INTRAVENOUS

## 2022-03-09 MED ORDER — INSULIN ASPART 100 UNIT/ML IJ SOLN
0.0000 [IU] | Freq: Three times a day (TID) | INTRAMUSCULAR | Status: DC
  Administered 2022-03-10: 5 [IU] via SUBCUTANEOUS

## 2022-03-09 MED ORDER — OXYCODONE HCL 5 MG PO TABS: 5.0000 mg | ORAL_TABLET | Freq: Once | ORAL | Status: DC | PRN

## 2022-03-09 MED ORDER — METFORMIN HCL ER 500 MG PO TB24
1000.0000 mg | ORAL_TABLET | Freq: Two times a day (BID) | ORAL | Status: DC
  Administered 2022-03-10: 1000 mg via ORAL
  Filled 2022-03-09: qty 2

## 2022-03-09 MED ORDER — 0.9 % SODIUM CHLORIDE (POUR BTL) OPTIME
TOPICAL | Status: DC | PRN
  Administered 2022-03-09: 1000 mL

## 2022-03-09 MED ORDER — CHILDRENS CHEW MULTIVITAMIN PO CHEW
2.0000 | CHEWABLE_TABLET | Freq: Every day | ORAL | Status: DC
  Administered 2022-03-10: 2 via ORAL
  Filled 2022-03-09 (×2): qty 2

## 2022-03-09 MED ORDER — MIDAZOLAM HCL 5 MG/5ML IJ SOLN
INTRAMUSCULAR | Status: DC | PRN
  Administered 2022-03-09 (×2): 1 mg via INTRAVENOUS

## 2022-03-09 MED ORDER — INSULIN ASPART 100 UNIT/ML IJ SOLN
0.0000 [IU] | Freq: Every day | INTRAMUSCULAR | Status: DC
  Administered 2022-03-09: 3 [IU] via SUBCUTANEOUS

## 2022-03-09 MED ORDER — SUFENTANIL CITRATE 50 MCG/ML IV SOLN
0.2500 ug/kg/h | INTRAVENOUS | Status: AC
  Administered 2022-03-09: .2 ug/kg/h via INTRAVENOUS
  Filled 2022-03-09: qty 1

## 2022-03-09 SURGICAL SUPPLY — 67 items
ALLOGRAFT LORDOTIC CC 7X11X14 (Bone Implant) IMPLANT
BAG COUNTER SPONGE SURGICOUNT (BAG) ×1 IMPLANT
BAND RUBBER #18 3X1/16 STRL (MISCELLANEOUS) ×2 IMPLANT
BENZOIN TINCTURE PRP APPL 2/3 (GAUZE/BANDAGES/DRESSINGS) IMPLANT
BLADE CLIPPER SURG (BLADE) IMPLANT
BUR MATCHSTICK NEURO 3.0 HP (BURR) ×1 IMPLANT
BUR MATCHSTICK NEURO 3.0 LAGG (BURR) IMPLANT
CABLE BIPOLOR RESECTION CORD (MISCELLANEOUS) ×1 IMPLANT
CANISTER SUCT 3000ML PPV (MISCELLANEOUS) ×1 IMPLANT
CLSR STERI-STRIP ANTIMIC 1/2X4 (GAUZE/BANDAGES/DRESSINGS) ×1 IMPLANT
CNTNR URN SCR LID CUP LEK RST (MISCELLANEOUS) IMPLANT
CONT SPEC 4OZ STRL OR WHT (MISCELLANEOUS) ×1
COVER MAYO STAND STRL (DRAPES) ×3 IMPLANT
COVER SURGICAL LIGHT HANDLE (MISCELLANEOUS) ×2 IMPLANT
DRAIN CHANNEL 15F RND FF W/TCR (WOUND CARE) IMPLANT
DRAIN PENROSE 0.5X18 (DRAIN) IMPLANT
DRAPE C-ARM 42X72 X-RAY (DRAPES) ×1 IMPLANT
DRAPE LAPAROTOMY 100X72X124 (DRAPES) ×1 IMPLANT
DRAPE MICROSCOPE LEICA (MISCELLANEOUS) ×1 IMPLANT
DRAPE POUCH INSTRU U-SHP 10X18 (DRAPES) ×1 IMPLANT
DRAPE SURG 17X23 STRL (DRAPES) ×1 IMPLANT
DRAPE U-SHAPE 47X51 STRL (DRAPES) ×1 IMPLANT
DRAPE UTILITY XL STRL (DRAPES) ×1 IMPLANT
DRSG OPSITE POSTOP 3X4 (GAUZE/BANDAGES/DRESSINGS) ×1 IMPLANT
DURAPREP 26ML APPLICATOR (WOUND CARE) ×1 IMPLANT
ELECT COATED BLADE 2.86 ST (ELECTRODE) ×1 IMPLANT
ELECT NVM5 SURFACE MEP/EMG (ELECTRODE) IMPLANT
ELECT PENCIL ROCKER SW 15FT (MISCELLANEOUS) ×1 IMPLANT
ELECT REM PT RETURN 9FT ADLT (ELECTROSURGICAL) ×1
ELECTRODE REM PT RTRN 9FT ADLT (ELECTROSURGICAL) ×1 IMPLANT
GAUZE SPONGE 4X4 12PLY STRL (GAUZE/BANDAGES/DRESSINGS) IMPLANT
GLOVE BIO SURGEON STRL SZ 6.5 (GLOVE) ×1 IMPLANT
GLOVE BIOGEL PI IND STRL 6.5 (GLOVE) ×1 IMPLANT
GLOVE BIOGEL PI IND STRL 8.5 (GLOVE) ×1 IMPLANT
GLOVE SS BIOGEL STRL SZ 8.5 (GLOVE) ×1 IMPLANT
GOWN STRL REUS W/ TWL LRG LVL3 (GOWN DISPOSABLE) ×1 IMPLANT
GOWN STRL REUS W/TWL 2XL LVL3 (GOWN DISPOSABLE) ×1 IMPLANT
GOWN STRL REUS W/TWL LRG LVL3 (GOWN DISPOSABLE) ×1
KIT BASIN OR (CUSTOM PROCEDURE TRAY) ×1 IMPLANT
KIT TURNOVER KIT B (KITS) ×1 IMPLANT
MODULE EMG NDL SSEP NVM5 (NEEDLE) IMPLANT
MODULE EMG NEEDLE SSEP NVM5 (NEEDLE) ×1 IMPLANT
NDL SPNL 18GX3.5 QUINCKE PK (NEEDLE) ×1 IMPLANT
NEEDLE HYPO 22GX1.5 SAFETY (NEEDLE) ×1 IMPLANT
NEEDLE SPNL 18GX3.5 QUINCKE PK (NEEDLE) ×1 IMPLANT
NS IRRIG 1000ML POUR BTL (IV SOLUTION) ×1 IMPLANT
PACK ORTHO CERVICAL (CUSTOM PROCEDURE TRAY) ×1 IMPLANT
PACK UNIVERSAL I (CUSTOM PROCEDURE TRAY) ×1 IMPLANT
PAD ARMBOARD 7.5X6 YLW CONV (MISCELLANEOUS) ×2 IMPLANT
PATTIES SURGICAL .25X.25 (GAUZE/BANDAGES/DRESSINGS) IMPLANT
PIN DISTRACTION 14MM (PIN) IMPLANT
PLATE ACP 1.6X40 2LVL (Plate) IMPLANT
POSITIONER HEAD DONUT 9IN (MISCELLANEOUS) ×1 IMPLANT
RESTRAINT LIMB HOLDER UNIV (RESTRAINTS) ×1 IMPLANT
SCREW ACP VA SD 3.5X15 (Screw) IMPLANT
SPONGE INTESTINAL PEANUT (DISPOSABLE) ×1 IMPLANT
SPONGE SURGIFOAM ABS GEL SZ50 (HEMOSTASIS) ×1 IMPLANT
SURGIFLO W/THROMBIN 8M KIT (HEMOSTASIS) IMPLANT
SUT BONE WAX W31G (SUTURE) ×1 IMPLANT
SUT MNCRL AB 3-0 PS2 27 (SUTURE) ×1 IMPLANT
SUT VIC AB 2-0 CT1 18 (SUTURE) ×1 IMPLANT
SYR BULB IRRIG 60ML STRL (SYRINGE) ×1 IMPLANT
TAPE CLOTH 4X10 WHT NS (GAUZE/BANDAGES/DRESSINGS) ×1 IMPLANT
TAPE CLOTH SURG 4X10 WHT LF (GAUZE/BANDAGES/DRESSINGS) IMPLANT
TOWEL GREEN STERILE (TOWEL DISPOSABLE) ×1 IMPLANT
TOWEL GREEN STERILE FF (TOWEL DISPOSABLE) ×1 IMPLANT
WATER STERILE IRR 1000ML POUR (IV SOLUTION) ×1 IMPLANT

## 2022-03-09 NOTE — Anesthesia Postprocedure Evaluation (Signed)
Anesthesia Post Note  Patient: Jonathan Melendez  Procedure(s) Performed: C5-6, C6-7 ANTERIOR CERVICAL DECOMPRESSION/DISCECTOMY FUSION 2 LEVELS (Spine Cervical)     Patient location during evaluation: PACU Anesthesia Type: General Level of consciousness: awake and alert Pain management: pain level controlled Vital Signs Assessment: post-procedure vital signs reviewed and stable Respiratory status: spontaneous breathing, nonlabored ventilation and respiratory function stable Cardiovascular status: stable and blood pressure returned to baseline Anesthetic complications: no   No notable events documented.  Last Vitals:  Vitals:   03/09/22 1945 03/09/22 2006  BP: (!) 140/96 (!) 150/94  Pulse: 94 87  Resp: 16 20  Temp: 36.9 C 36.8 C  SpO2: 93% 94%    Last Pain:  Vitals:   03/09/22 2006  TempSrc: Oral  PainSc:                  Audry Pili

## 2022-03-09 NOTE — H&P (Addendum)
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 Has central and foraminal stenosis on MRI at C5/6 and C6/7.   Plan: -Out of bed as tolerated, activity as tolerated, no brace -Covered the risks of surgery one more time with the patient and patient elected to proceed with planned surgery -Written consent verified -Hold anticoagulation in anticipation of surgery -Ancef on all to OR -NPO for procedure -Site marked -To OR when ready  The patient has signs and symptoms consistent with cervical myelopathy. The most common natural history of cervical myelopathy was explained to the patient. Given this course, operative management in the form of C5-7 anterior cervical discectomy and fusion was recommended to the patient. The risks, including but not limited to pseudarthrosis, dysphagia, hematoma, airway compromise, recurrent laryngeal nerve injury, esophageal perforation, durotomy, spinal cord injury, nerve root injury, persistent pain, adjacent segment disease, infection, bleeding, hardware failure, vascular injury, heart attack, death, stroke, fracture, and need for additional procedures were discussed with the patient. The benefit of surgery would be preventing progression of the myelopathy and not to reverse any myelopathic symptoms. Explained that the surgery would help with his radicular type pain, but he may not get full relief of his pain with this surgery, especially any neck pain. The alternatives to surgical management would be continued monitoring, physical therapy, over-the-counter pain medications, ambulatory aids, and activity modification. I reiterated that these modalities would not change the natural history of the disease and that is why operative management has been recommended. All the patient's questions were answered to their satisfaction. After this discussion, the patient expressed understanding and elected to proceed with surgical  intervention.     ___________________________________________________________________________  Chief Complaint: radiating arm pain and decreased hand function  History: Patient is 52 y.o. male who has been previously seen in the office for symptoms of pain that radiate from his neck down his arm (R>L). He also reported decreased hand function with decreased sensation and decreased ability to perform fine motor skills. Initially, he had been working on blood sugar control prior to operative intervention but his function continued to decrease so operative intervention was discussed for his myelopathy symptoms. The patient presents today with no changes in his symptoms since the last office visit. See previous office note for further details.    Review of systems: General: denies fevers and chills, myalgias Neurologic: denies recent changes in vision, slurred speech Abdomen: denies nausea, vomiting, hematemesis Respiratory: denies cough, shortness of breath  Past medical history: Hyperlipidemia Hypertension Depression Diabetes Ankylosing spondylitis   Allergies: NKDA   Past surgical history:  Vasectomy Kidney stone removal   Social history: Denies use of nicotine product (smoking, vaping, patches, smokeless) Alcohol use: Yes, 1 to 2/month Denies recreational drug use  Family history: -reviewed and not pertinent to myeloradiculopathy   Physical Exam:  General: no acute distress, appears stated age Neurologic: alert, answering questions appropriately, following commands Cardiovascular: regular rate, no cyanosis Respiratory: unlabored breathing on room air, symmetric chest rise Psychiatric: appropriate affect, normal cadence to speech   MSK (spine):  -Strength exam      Left  Right Grip strength               5/5  5/5 Interosseus   5/5   5/5 Wrist extension  4+/5  4/5 Wrist flexion   4+/5  4/5 Elbow flexion   5/5  4+/5 Deltoid    5/5  4+/5  EHL    5/5  5/5 TA     5/5  5/5 GSC    5/5  5/5 Knee extension  5/5  5/5 Knee flexion   5/5  5/5 Hip flexion   5/5  5/5  -Sensory exam    Sensation intact to light touch in L3-S1 nerve distributions of bilateral lower extremities  Sensation intact to light touch in C5-T1 nerve distributions of bilateral upper extremities   Patient name: Jonathan Melendez Patient MRN: [id number] Date: 03/09/22

## 2022-03-09 NOTE — Progress Notes (Signed)
Orthopedic Tech Progress Note Patient Details:  Jonathan Melendez 1970/09/12 [id number]  Ortho Devices Type of Ortho Device: Aspen cervical collar Ortho Device/Splint Interventions: Ordered     Aspen dropped off at [employer]. Vernona Rieger 03/09/2022, 6:29 PM

## 2022-03-09 NOTE — Progress Notes (Signed)
Orthopedic Surgery Post-operative Progress Note  Assessment: Patient is a 52 y.o. male who is currently admitted after undergoing C5-7 ACDF   Plan: -Operative plans complete -Needs upright films when able -Drain: to be maintained until tomorrow morning -Out of bed as tolerated with aspen collar -No bending/lifting/twisting greater than 10 pounds -PT evaluation and treat -Pain control -Diabetic diet -Ancef x2 post-operative doses -No antiplatelet or dvt chemoprophylaxis for 72 hours after surgery -Disposition: remain floor status  _________________________________________________________________________  Subjective: No acute events since surgery. Has made it to the floor. Not having any left arm pain. Right arm pain improved from pre-op but still having some pain around the right elbow. Has decreased sensation in right thumb, index, and long finger. No other numbness or paresthesias. Was able to eat some dinner. Had a sore throat but was able to swallow without issue, just took extra bites.   Objective:  General: no acute distress, appropriate affect Neurologic: alert, answering questions appropriately, following commands Respiratory: unlabored breathing on room air Neck: no hematoma appreciated Skin: dressing clear/dry/intact, c collar in place  MSK (spine):  -Strength exam      Right  Left Grip strength                5/5  5/5 Interosseus   5/5   5/5 Wrist extension  4/5  4+/5 Wrist flexion   4/5  4+/5 Elbow flexion   4+/5  5/5 Deltoid    4+/5  5/5  EHL    5/5  5/5 TA    5/5  5/5 GSC    5/5  5/5 Knee extension  5/5  5/5 Hip flexion   5/5  5/5  -Sensory exam    Sensation intact to light touch in L3-S1 nerve distributions of bilateral lower extremities  Sensation intact to light touch in C5-T1 nerve distributions of bilateral upper extremities   Patient name: Jonathan Melendez Patient MRN: [id number] Date: 03/09/22

## 2022-03-09 NOTE — Brief Op Note (Signed)
03/09/2022  6:36 PM  PATIENT:  Jonathan Melendez  52 y.o. male  PRE-OPERATIVE DIAGNOSIS:  CERVICAL SPONDYLOTIC MYELOPATHY  POST-OPERATIVE DIAGNOSIS:  CERVICAL SPONDYLOTIC MYELOPATHY  PROCEDURE:  Procedure(s): C5-6, C6-7 ANTERIOR CERVICAL DECOMPRESSION/DISCECTOMY FUSION 2 LEVELS (N/A)  SURGEON:  Surgeon(s) and Role:    * Callie Fielding, MD - Primary  PHYSICIAN ASSISTANT:   ASSISTANTS: none   ANESTHESIA:   general  EBL:  20 mL   BLOOD ADMINISTERED:none  DRAINS: Penrose drain in the neck    LOCAL MEDICATIONS USED:  NONE  SPECIMEN:  No Specimen  DISPOSITION OF SPECIMEN:  N/A  COUNTS:  YES  TOURNIQUET:  none  DICTATION: .Note written in EPIC  PLAN OF CARE: Admit for overnight observation  PATIENT DISPOSITION:  PACU - hemodynamically stable.   Delay start of Pharmacological VTE agent (>24hrs) due to surgical blood loss or risk of bleeding: yes

## 2022-03-09 NOTE — Anesthesia Preprocedure Evaluation (Signed)
Anesthesia Evaluation  Patient identified by MRN, date of birth, ID band Patient awake    Reviewed: Allergy & Precautions, NPO status , Patient's Chart, lab work & pertinent test results  Airway Mallampati: III  TM Distance: >3 FB Neck ROM: Limited    Dental  (+) Teeth Intact, Dental Advisory Given   Pulmonary neg pulmonary ROS   breath sounds clear to auscultation       Cardiovascular hypertension, Pt. on medications (-) angina (-) Past MI and (-) CHF  Rhythm:Regular     Neuro/Psych  Neuromuscular disease  negative psych ROS   GI/Hepatic negative GI ROS, Neg liver ROS,,,  Endo/Other  diabetes, Type 2    Renal/GU Lab Results      Component                Value               Date                      CREATININE               1.22                07/18/2021                Musculoskeletal  (+) Arthritis ,    Abdominal   Peds  Hematology negative hematology ROS (+) Lab Results      Component                Value               Date                      WBC                      10.1                07/18/2021                HGB                      15.8                07/18/2021                HCT                      45.1                07/18/2021                MCV                      84.3                07/18/2021                PLT                      240                 07/18/2021              Anesthesia Other Findings   Reproductive/Obstetrics  Anesthesia Physical Anesthesia Plan  ASA: 2  Anesthesia Plan: General   Post-op Pain Management: Ofirmev IV (intra-op)*   Induction: Intravenous  PONV Risk Score and Plan: 2 and Ondansetron, Dexamethasone and Propofol infusion  Airway Management Planned: Video Laryngoscope Planned and Oral ETT  Additional Equipment: None  Intra-op Plan:   Post-operative Plan: Extubation in OR  Informed Consent: I have  reviewed the patients History and Physical, chart, labs and discussed the procedure including the risks, benefits and alternatives for the proposed anesthesia with the patient or authorized representative who has indicated his/her understanding and acceptance.     Dental advisory given  Plan Discussed with: CRNA  Anesthesia Plan Comments:        Anesthesia Quick Evaluation

## 2022-03-09 NOTE — Op Note (Signed)
Orthopedic Spine Surgery Operative Report  Procedure: C5/6 and C6/7 anterior cervical discectomy and fusion C5/6 and C6/7 placement of structural interbody allograft spacer C5, C6, C7 anterior plate instrumentation Placement and removal of Gardner-Wells tongs for disc space distraction  Modifier: none  Date of procedure: 03/09/2022  Patient name: Jonathan Melendez MRN: [id number] DOB: 1970-09-08  Surgeon: Ileene Rubens, MD Assistant: None Pre-operative diagnosis: cervical myeloradiculopathy Post-operative diagnosis: same as above Findings: degenerative discs at C5/6 and C6/7, anterior osteophyte formation at C5/6 and C6/7  Specimens: none Anesthesia: general EBL: 53ZS Complications: none Pre-incision antibiotic: ancef Pre-incision decadron: '10mg'$   Implants:  C5/6 - 7x14x11 7 degree allograft C6/7 - 7x14x11 7 degree allograft 3.5 x 51m screws x6 426mNuvasive anterior cervical plate   Indication for procedure: Patient is a [age].o. male who presented to the office with signs and symptoms consistent with cervical myeloradiculopathy. The natural history of cervical myelopathy was explained to the patient. After this discussion, surgical intervention was recommended. The pre-operative MRI showed stenosis from C5-7 so a C5-7 anterior cervical discectomy and fusion was presented as a treatment option. The risks, including but not limited to pseudarthrosis, dysphagia, hematoma, airway compromise, recurrent laryngeal nerve injury, esophageal perforation, durotomy, spinal cord injury, nerve root injury, persistent pain, adjacent segment disease, infection, bleeding, hardware failure, vascular injury, heart attack, death, stroke, fracture, and need for additional procedures were discussed with the patient. The benefit of surgery would be preventing progression of the myelopathy and not to reverse any myelopathic symptoms. Explained that a second benefit and goal of surgery would be relief of  his radiating arm pain, but he may not get full relief of his pain with this surgery, especially any neck pain. The alternatives to surgical management would be continued monitoring, physical therapy, over-the-counter pain medications, ambulatory aids, and activity modification. I reiterated that these modalities would not change the natural history of the disease and that is why operative management has been recommended. All the patient's questions were answered to his satisfaction. After this discussion, the patient expressed understanding and elected to proceed with surgical intervention.    Procedure Description: The patient was met in the pre-operative holding area. The patient's identity and consent were verified. The operative site was marked. The patient's remaining questions about the surgery were answered. The patient was brought back to the operating room. General anesthesia was induced and an endotracheal tube was placed by the anesthesia staff. The patient was transferred to the flat top JaHenryable in the supine position. All bony prominences were well padded. A bump was placed underneath the patient's shoulders to extend the neck slightly.  Neuromonitoring leads were attached to the patient by the neuromonitoring technologist. Gardner-Wells tongs were attached to the patient's head about 1cm above the pinna in line with the external auditory meatus. Baseline neuromonitoring signals were obtained. 15 pounds of weight was then attached to the tongs. There was no change in the neuromonitoring signals after the weight was attached. The patient's shoulders were than taped to the bed to improve the fluoroscopic visualization during the procedure. His facial hair over the neck was shaved with an elCopyThe surgical area was cleansed with alcohol. Fluoroscopy was then brought in to check rotation on the AP image and to mark the levels on the lateral image. The patient's skin was then prepped and  draped in a standard, sterile fashion. A time out was performed that identified the patient, the procedure, and the operative levels. All team members agreed  with what was stated in the time out.   A left-sided transverse incision was made in the middle of the previously marked levels. Incision was taken sharply down through the skin and dermis. Electrocautery was used to dissect down to the level of the platysma. A Metzenbaum scissors was used to elevate flaps both cranially and caudally above the platysma. A small opening was made in the platysma with the Metzenbaum scissors. The scissors were then placed under the platysma and electrocautery was used on top of the scissors to split the platysma in line with the incision. Blunt dissection was carried out medial to the sternocleidomastoid to identify the omohyoid. The omohyoid was then dissected out with the Metzenbaum scissors. A right angle was placed underneath it and electrocautery was used to split the inferior aspect of the omohyoid. An appendiceal retractor was placed around the carotid sheath to retract it laterally and a cloward retractor was placed medially to retract the esophagus and trachea medially. The interval between these structures was then bluntly dissected with the Metzenbaum scissors. At this point, the prevertebral fascia was visible within the wound. A kittner was used to dissect the prevertebral fascia off the vertebral bodies and discs. A bayonetted needle was placed into the disc thought to be the correct level. A lateral fluoroscopic shot was then used to confirm the level. Once the correct level was identified, electrocautery was used to mark the disc space. The retractors that were previously placed were then put back into the wound.   The operative microscope was brought in at this point. Electrocautery was used to elevate the longus coli off the bone on each side. Blunt retractor blades were then placed under the longus coli on  each side and the self-retainer was attached to these retractor blades. A long handle fifteen blade was then used to incise the disc space along the endplates and longitudinally to connect these end plate incisions to create a rectangular incision in the disc. A pituitary was used to remove the incised disc material. A 2 kerrison was used to remove the anterior osteophyte and square the inferior endplate of the cranial vertebra. A combination of straight and curved curettes were used to remove further disc material and the cartilaginous aspect of the endplates. This was done until both the inferior and superior endplates had been prepared from the uncus to uncus and from ventral vertebral body to the posterior osteophytes. At this point, a burr was then used to remove the posterior vertebral osteophytes. Once the PLL was visualized within the wound, a nerve hook was used to develop the plane between the PLL and the dura. While lifting up the PLL with the nerve hook, a 1 kerrison was placed under the PLL and used to remove the PLL. Once some of the PLL had been removed, a combination of 1 and 2 kerrisons were used to continue removing the PLL until it had been completely removed. A curved curette was then placed into the foramen and pointed ventrally. It was used to remove soft tissue and the deep aspect of the uncus. A nerve hook was then easily passed into the foramen. The same process was repeated for the other foramen. Lateral fluoroscopic imaging was used to guide the insertion of trials into the disc space in a serial fashion, starting with the smallest trial. Once a good fit was obtained, the trial was left in and neuromonitoring signals were checked. There were no changes from baseline. A 56m allograft was selected  for use as the final implant. The allograft interbody implant was then placed into the prepared disc space and lightly malleted into place. Neuromonitoring signals were checked again and no changes  from baseline were noted. AP and lateral fluoroscopic images was obtained and confirmed satisfactory position of the final implant.  The retractor blades were removed from the wound. Then, the same steps in the above paragraph were repeated for the C6/7 disc spaces. AP and lateral fluoroscopic images of the entire C spine were obtained which showed satisfactory positioning of all the interbody implants.   The microscope was then moved away from the operative field and the retractor blades were removed from the wound. Anesthesia staff removed the weight from the Gardner-Wells tongs and a repeat neuromonitoring check showed no change from baseline. A cloward was then used to retract the esophagus and trachea away from the ventral vertebral bodies and another was used to retract the carotid and sternocleidomastoid laterally. A plate was then selected and placed over the ventral aspects of the vertebral bodies. Lateral fluoroscopy confirmed appropriate plate length. No soft tissue structures were underneath the plate. An awl was put into the wound and put into one of the screw holes in the plate. It was aimed slightly medially. It was impacted down into the hole. A 71m screw was then inserted into the drilled hole. The same steps were repeated with the awl and screw to place the same sized screws in the remaining holes within the plate. Again, the wound was checked and no soft tissue structures were seen underneath the plate.   Final AP and lateral fluoroscopic films were taken and confirmed satisfactory position of the plate and interbody implants. The screws were final tightened. Hemostasis was achieved. A penrose drain was placed into the wound. The platysma was reapproximated using 2-0 vicryl. Neuromonitoring was checked and there were no changes from baseline. Neuromonitoring was discontinued at this time. The deep dermal layer was closed with 2-0 vicryl. The skin was closed with a 3-0 monocryl. All counts  were correct at the end of the procedure. Benzoine and steri strips were used over the incision area. The wound was dressed with 4x4 gauze and paper tape. The Gardner-Wells tongs were removed and bacitracin was placed over the skin puncture sites. The patient was awakened from anesthesia and transferred to a bed where a hard cervical collar was applied. The patient was brought back to the post-anesthesia care unit in stable condition by the anesthesia staff.   Post-operative plan: The patient will recover in the post-anesthesia care unit and then go to the floor. The patient will receive two post-operative doses of ancef. The patient will be out of bed as tolerated in a cervical collar. The patient will work with physical therapy. The drain will be removed on post-operative day one. The patient's disposition will be determined based off how he is doing on the floor post-operatively.       MIleene Rubens MD Orthopedic Surgeon

## 2022-03-09 NOTE — Anesthesia Procedure Notes (Signed)
Procedure Name: Intubation Date/Time: 03/09/2022 12:41 PM  Performed by: Colin Benton, CRNAPre-anesthesia Checklist: Patient identified, Emergency Drugs available, Suction available and Patient being monitored Patient Re-evaluated:Patient Re-evaluated prior to induction Oxygen Delivery Method: Circle system utilized Preoxygenation: Pre-oxygenation with 100% oxygen Induction Type: IV induction Ventilation: Mask ventilation without difficulty Laryngoscope Size: Glidescope and 4 Grade View: Grade I Tube type: Oral Tube size: 7.5 mm Number of attempts: 1 Airway Equipment and Method: Rigid stylet and Video-laryngoscopy Placement Confirmation: ETT inserted through vocal cords under direct vision, positive ETCO2 and breath sounds checked- equal and bilateral Secured at: 24 cm Tube secured with: Tape Dental Injury: Teeth and Oropharynx as per pre-operative assessment

## 2022-03-09 NOTE — Transfer of Care (Signed)
Immediate Anesthesia Transfer of Care Note  Patient: Jonathan Melendez  Procedure(s) Performed: C5-6, C6-7 ANTERIOR CERVICAL DECOMPRESSION/DISCECTOMY FUSION 2 LEVELS (Spine Cervical)  Patient Location: PACU  Anesthesia Type:General  Level of Consciousness: awake and oriented  Airway & Oxygen Therapy: Patient Spontanous Breathing and Patient connected to nasal cannula oxygen  Post-op Assessment: Report given to RN and Patient moving all extremities X 4  Post vital signs: Reviewed and stable  Last Vitals:  Vitals Value Taken Time  BP 156/90 03/09/22 1822  Temp 37 C 03/09/22 1822  Pulse 94 03/09/22 1823  Resp 10 03/09/22 1823  SpO2 94 % 03/09/22 1823  Vitals shown include unvalidated device data.  Last Pain:  Vitals:   03/09/22 1044  TempSrc:   PainSc: 6          Complications: No notable events documented.

## 2022-03-10 ENCOUNTER — Observation Stay (HOSPITAL_COMMUNITY): Payer: BC Managed Care – PPO

## 2022-03-10 DIAGNOSIS — Z9889 Other specified postprocedural states: Secondary | ICD-10-CM | POA: Diagnosis not present

## 2022-03-10 DIAGNOSIS — M4322 Fusion of spine, cervical region: Secondary | ICD-10-CM | POA: Diagnosis not present

## 2022-03-10 DIAGNOSIS — M47812 Spondylosis without myelopathy or radiculopathy, cervical region: Secondary | ICD-10-CM | POA: Diagnosis not present

## 2022-03-10 DIAGNOSIS — M5412 Radiculopathy, cervical region: Secondary | ICD-10-CM | POA: Diagnosis not present

## 2022-03-10 DIAGNOSIS — E871 Hypo-osmolality and hyponatremia: Secondary | ICD-10-CM | POA: Diagnosis not present

## 2022-03-10 DIAGNOSIS — I10 Essential (primary) hypertension: Secondary | ICD-10-CM | POA: Diagnosis not present

## 2022-03-10 DIAGNOSIS — M4802 Spinal stenosis, cervical region: Secondary | ICD-10-CM | POA: Diagnosis not present

## 2022-03-10 DIAGNOSIS — G959 Disease of spinal cord, unspecified: Secondary | ICD-10-CM | POA: Diagnosis not present

## 2022-03-10 DIAGNOSIS — E1165 Type 2 diabetes mellitus with hyperglycemia: Secondary | ICD-10-CM | POA: Diagnosis not present

## 2022-03-10 DIAGNOSIS — G549 Nerve root and plexus disorder, unspecified: Secondary | ICD-10-CM

## 2022-03-10 LAB — CBC
HCT: 41 % (ref 39.0–52.0)
Hemoglobin: 14.9 g/dL (ref 13.0–17.0)
MCH: 31 pg (ref 26.0–34.0)
MCHC: 36.3 g/dL — ABNORMAL HIGH (ref 30.0–36.0)
MCV: 85.2 fL (ref 80.0–100.0)
Platelets: 228 K/uL (ref 150–400)
RBC: 4.81 MIL/uL (ref 4.22–5.81)
RDW: 11.9 % (ref 11.5–15.5)
WBC: 16.3 K/uL — ABNORMAL HIGH (ref 4.0–10.5)
nRBC: 0 % (ref 0.0–0.2)

## 2022-03-10 LAB — GLUCOSE, CAPILLARY: Glucose-Capillary: 239 mg/dL — ABNORMAL HIGH (ref 70–99)

## 2022-03-10 LAB — BASIC METABOLIC PANEL WITH GFR
Anion gap: 11 (ref 5–15)
BUN: 8 mg/dL (ref 6–20)
CO2: 24 mmol/L (ref 22–32)
Calcium: 9.1 mg/dL (ref 8.9–10.3)
Chloride: 96 mmol/L — ABNORMAL LOW (ref 98–111)
Creatinine, Ser: 0.97 mg/dL (ref 0.61–1.24)
GFR, Estimated: >60 mL/min (ref >60)
Glucose, Bld: 236 mg/dL — ABNORMAL HIGH (ref 70–99)
Potassium: 3.5 mmol/L (ref 3.5–5.1)
Sodium: 131 mmol/L — ABNORMAL LOW (ref 135–145)

## 2022-03-10 MED ORDER — POLYETHYLENE GLYCOL 3350 17 G PO PACK: 17.0000 g | PACK | Freq: Every day | ORAL | 0 refills | Status: AC

## 2022-03-10 MED ORDER — PREGABALIN 50 MG PO CAPS: 50.0000 mg | ORAL_CAPSULE | Freq: Three times a day (TID) | ORAL | 0 refills | Status: DC

## 2022-03-10 MED ORDER — SODIUM CHLORIDE 1 G PO TABS
1.0000 g | ORAL_TABLET | Freq: Once | ORAL | Status: AC
  Administered 2022-03-10: 1 g via ORAL
  Filled 2022-03-10: qty 1

## 2022-03-10 MED ORDER — METHOCARBAMOL 750 MG PO TABS: 750.0000 mg | ORAL_TABLET | Freq: Four times a day (QID) | ORAL | 0 refills | Status: DC

## 2022-03-10 MED ORDER — SENNA 8.6 MG PO TABS: 1.0000 | ORAL_TABLET | Freq: Two times a day (BID) | ORAL | 0 refills | Status: AC

## 2022-03-10 MED ORDER — OXYCODONE HCL 5 MG PO TABS: 5.0000 mg | ORAL_TABLET | ORAL | 0 refills | Status: DC | PRN

## 2022-03-10 MED ORDER — ACETAMINOPHEN 500 MG PO TABS: 1000.0000 mg | ORAL_TABLET | Freq: Three times a day (TID) | ORAL | 0 refills | Status: AC

## 2022-03-10 NOTE — Progress Notes (Signed)
Patient is discharged from room 3C05 at this time. Alert and in stable condition. IV site d/c'd as well as drain. Instructions read to patient and daughter with all questions answered. Left unit via wheelchair with all belongings at side.

## 2022-03-10 NOTE — Discharge Instructions (Addendum)
Orthopedic Surgery Discharge Instructions  Patient name: Jonathan Melendez Procedure Performed: C5-7 ACDF Date of Surgery: 03/09/2022 Surgeon: Ileene Rubens, MD  Pre-operative Diagnosis: Cervical myeloradiculopathy Post-operative Diagnosis: Same as above  Discharge Date: 03/10/2022 Discharged to: home Discharge Condition: stable  Activity: You should wear your cervical collar for six weeks after surgery. It is okay to remove the cervical collar when taking a shower, but you should have it on at all other times. You should refrain from bending, lifting, or twisting with objects greater than ten pounds until three months after surgery. You are encouraged to walk as much as desired. You can perform household activities such as cleaning dishes, doing laundry, vacuuming, etc. as long as the ten-pound restriction is followed.  Incision Care: Your incision site has a dressing over it. That dressing should remain in place and dry at all times for a total of one week after surgery. After one week, you can remove the dressing. Underneath the dressing, you will find pieces of tape. You should leave these pieces of tape in place. They will fall off with time. Do not pick, rub, or scrub at them. Do not put cream or lotion over the surgical area. After one week and once the dressing is off, it is okay to let soap and water run over your incision. Again, do not pick, scrub, or rub at the pieces of tape when bathing. Do not submerge (e.g., take a bath, swim, go in a hot tub, etc.) until six weeks after surgery. There may be some bloody drainage from the incision into the dressing after surgery. This is normal. You do not need to replace the dressing. Continue to leave it in place for the one week as instructed above. Should the dressing become saturated with blood or drainage, please call the office for further instructions.   Medications: You have been prescribed oxycodone. This is a narcotic pain medication and  should only be taken as prescribed. You should not drink alcohol or operate heavy machinery (including driving) while taking this medication. The oxycodone can cause constipation as a side effect. For that reason, you have been prescribed senna and miralax. These are both laxatives. You do not need to take this medication if you develop diarrhea. Should you remain constipated even while taking these medications, please increase the dose of miralax to twice daily. Tylenol has been prescribed to be taken every 8 hours, which will give you additional pain relief. Robaxin is a muscle relaxer that has been prescribed to you for muscle spasm type pain. Take this medication as needed. Lyrica has been prescribed to help with nerve pain. Take this medication as instructed.   Do not take NSAIDs (ibuprofen, Aleve, Celebrex, naproxen, meloxicam, etc.) for the first 6 weeks after surgery as there is some evidence that their use may decrease the chances of successful fusion.   In order to set expectations for opioid prescriptions, you will only be prescribed opioids for a total of six weeks after surgery and, at two-weeks after surgery, your opioid prescription will start to tapered (decreased dosage and number of pills). If you have ongoing need for opioid medication six weeks after surgery, you will be referred to pain management. If you are already established with a provider that is giving you opioid medications, you should schedule an appointment with them for six weeks after surgery if you feel you are going to need another prescription. State law only allows for opioid prescriptions one week at a time. If you  are running out of opioid medication near the end of the week, please call the office during business hours before running out so I can send you another prescription.   You may resume any home blood thinners (aspirin, warfarin, lovenox, apixaban, plavix, xarelto, etc.) 72 hours after your surgery. Take these  medications as they were previously prescribed.   Driving: You should not drive while taking narcotic pain medications. You should also not drive if you feel you cannot turn your body enough to check your blind spots while in your cervical collar. In this case, you should wait six weeks to drive when your cervical collar will be discontinued. You should start getting back to driving slowly and you may want to try driving in a parking lot before doing anything more. Please be mindful that in some states it is illegal to drive with a cervical collar in place. You should check your local laws before driving and you may have to wait until the collar is discontinued before driving.  Diet: You are safe to resume your regular diet. A common complication after cervical spine surgery is trouble swallowing especially if the incision was made over the front part of your neck. This complication happens often and, the vast majority of the time, it is a temporary problem that gets better with time. The first few days after surgery, you should take more bites than normal to break the food into smaller pieces before swallowing. With time as the swallowing becomes easier, you can gradually get back to taking bites like you normally would.   Reasons to Call the Office After Surgery: You should feel free to call the office with any concerns or questions you have in the post-operative period, but you should definitely notify the office if you develop: -shortness of breath, chest pain, or trouble breathing -excessive bleeding, drainage, redness, or swelling around the surgical site -fevers, chills, or pain that is getting worse with each passing day -persistent nausea or vomiting -new weakness in any extremity, new or worsening numbness or tingling in any extremity -numbness in the groin, bowel or bladder incontinence -other concerns about your surgery  Follow Up Appointments: You should have an office appointment  scheduled for approximately two weeks after surgery. If you do not remember when this appointment is or do not already have it scheduled, please call the office to schedule.   Office Information:  -Ileene Rubens, MD -Phone number: [phone] -Address: [address]       [place], [place] [address]

## 2022-03-10 NOTE — Progress Notes (Addendum)
Orthopedic Surgery Post-operative Progress Note  Assessment: Patient is a 52 y.o. male who is currently admitted after undergoing C5-7 ACDF   Plan: -Operative plans complete -Drain: pulled this morning -Out of bed as tolerated with aspen collar -No bending/lifting/twisting greater than 10 pounds -PT evaluation and treat -Pain control -Diabetic diet -Ancef x2 post-operative doses -No antiplatelet or dvt chemoprophylaxis for 72 hours after surgery -Anticipate discharge to home today  DM -hyperglycemic this morning -restarting home metformin -sliding scale insulin -diabetic diet  HLD -continue home lipitor  HTN -continue home amlodipine and metoprolol  _________________________________________________________________________  Subjective: No acute events overnight. Pain is improved slightly in the right arm this morning. No pain in the left arm. Still has decreased sensation in the thumb, index, and long finger on the right. No other numbness or paresthesias. Was able to tolerate breakfast with minimal discomfort.   Objective:  General: no acute distress, appropriate affect Neurologic: alert, answering questions appropriately, following commands Respiratory: unlabored breathing on room air Neck: no hematoma appreciated Skin: dressing clear/dry/intact, c collar in place  MSK (spine):  -Strength exam      Right  Left Grip strength                5/5  5/5 Interosseus   5/5   5/5 Wrist extension  4/5  4+/5 Wrist flexion   4/5  4+/5 Elbow flexion   4+/5  5/5 Deltoid    4+/5  5/5  EHL    5/5  5/5 TA    5/5  5/5 GSC    5/5  5/5 Knee extension  5/5  5/5 Hip flexion   5/5  5/5  -Sensory exam    Sensation intact to light touch in L3-S1 nerve distributions of bilateral lower extremities  Sensation intact to light touch in C5-T1 nerve distributions of bilateral upper extremities   Patient name: Jonathan Melendez Patient MRN: [id number] Date: 03/10/22

## 2022-03-10 NOTE — Discharge Summary (Addendum)
Orthopedic Surgery Discharge Summary  Patient name: Jonathan Melendez Patient MRN: [id number] [place] today: 03/09/2022 Discharge date: 03/10/2022  Attending physician: Ileene Rubens, MD Final diagnosis: Cervical myeloradiculopathy Findings: Degenerated discs at C5/6 and C6/7. Anterior osteophyte formation at C5/6 and C6/7  Hospital course: Patient is a 52 y.o. male who was admitted after undergoing C5-7 ACDF. The patient had significant pain immediately after surgery, but pain eventually was controlled with a multimodal regimen including oxycodone. Labs during the hospitalization revealed hyperglycemia. He was on a sliding scale insulin during admission. He has a glucose monitor and has been controlling his blood sugars at home. He also was hyponatremic with a sodium of 131. He has given a salt tablet prior to discharge. The patient worked with physical therapy who recommended discharge to home. The patient was tolerating an oral diet without issue and was voiding spontaneously after surgery. The patient's vitals were stable on the day of discharge. The patient's drains were removed on the day of discharge. The patient was medically ready for discharge and was discharge to home on post-operative day one.  Instructions:   Orthopedic Surgery Discharge Instructions  Patient name: Jonathan Melendez Procedure Performed: C5-7 ACDF Date of Surgery: 03/09/2022 Surgeon: Ileene Rubens, MD  Pre-operative Diagnosis: Cervical myeloradiculopathy Post-operative Diagnosis: Same as above  Discharge Date: 03/10/2022 Discharged to: home Discharge Condition: stable  Activity: You should wear your cervical collar for six weeks after surgery. It is okay to remove the cervical collar when taking a shower, but you should have it on at all other times. You should refrain from bending, lifting, or twisting with objects greater than ten pounds until three months after surgery. You are encouraged to walk as much as  desired. You can perform household activities such as cleaning dishes, doing laundry, vacuuming, etc. as long as the ten-pound restriction is followed.  Incision Care: Your incision site has a dressing over it. That dressing should remain in place and dry at all times for a total of one week after surgery. After one week, you can remove the dressing. Underneath the dressing, you will find pieces of tape. You should leave these pieces of tape in place. They will fall off with time. Do not pick, rub, or scrub at them. Do not put cream or lotion over the surgical area. After one week and once the dressing is off, it is okay to let soap and water run over your incision. Again, do not pick, scrub, or rub at the pieces of tape when bathing. Do not submerge (e.g., take a bath, swim, go in a hot tub, etc.) until six weeks after surgery. There may be some bloody drainage from the incision into the dressing after surgery. This is normal. You do not need to replace the dressing. Continue to leave it in place for the one week as instructed above. Should the dressing become saturated with blood or drainage, please call the office for further instructions.   Medications: You have been prescribed oxycodone. This is a narcotic pain medication and should only be taken as prescribed. You should not drink alcohol or operate heavy machinery (including driving) while taking this medication. The oxycodone can cause constipation as a side effect. For that reason, you have been prescribed senna and miralax. These are both laxatives. You do not need to take this medication if you develop diarrhea. Should you remain constipated even while taking these medications, please increase the dose of miralax to twice daily. Tylenol has been prescribed to be taken  every 8 hours, which will give you additional pain relief. Robaxin is a muscle relaxer that has been prescribed to you for muscle spasm type pain. Take this medication as needed. Lyrica  has been prescribed to help with nerve pain. Take this medication as instructed.   Do not take NSAIDs (ibuprofen, Aleve, Celebrex, naproxen, meloxicam, etc.) for the first 6 weeks after surgery as there is some evidence that their use may decrease the chances of successful fusion.   In order to set expectations for opioid prescriptions, you will only be prescribed opioids for a total of six weeks after surgery and, at two-weeks after surgery, your opioid prescription will start to tapered (decreased dosage and number of pills). If you have ongoing need for opioid medication six weeks after surgery, you will be referred to pain management. If you are already established with a provider that is giving you opioid medications, you should schedule an appointment with them for six weeks after surgery if you feel you are going to need another prescription. State law only allows for opioid prescriptions one week at a time. If you are running out of opioid medication near the end of the week, please call the office during business hours before running out so I can send you another prescription.   You may resume any home blood thinners (aspirin, warfarin, lovenox, apixaban, plavix, xarelto, etc.) 72 hours after your surgery. Take these medications as they were previously prescribed.   Driving: You should not drive while taking narcotic pain medications. You should also not drive if you feel you cannot turn your body enough to check your blind spots while in your cervical collar. In this case, you should wait six weeks to drive when your cervical collar will be discontinued. You should start getting back to driving slowly and you may want to try driving in a parking lot before doing anything more. Please be mindful that in some states it is illegal to drive with a cervical collar in place. You should check your local laws before driving and you may have to wait until the collar is discontinued before driving.  Diet:  You are safe to resume your regular diet. A common complication after cervical spine surgery is trouble swallowing especially if the incision was made over the front part of your neck. This complication happens often and, the vast majority of the time, it is a temporary problem that gets better with time. The first few days after surgery, you should take more bites than normal to break the food into smaller pieces before swallowing. With time as the swallowing becomes easier, you can gradually get back to taking bites like you normally would.   Reasons to Call the Office After Surgery: You should feel free to call the office with any concerns or questions you have in the post-operative period, but you should definitely notify the office if you develop: -shortness of breath, chest pain, or trouble breathing -excessive bleeding, drainage, redness, or swelling around the surgical site -fevers, chills, or pain that is getting worse with each passing day -persistent nausea or vomiting -new weakness in any extremity, new or worsening numbness or tingling in any extremity -numbness in the groin, bowel or bladder incontinence -other concerns about your surgery  Follow Up Appointments: You should have an office appointment scheduled for approximately two weeks after surgery. If you do not remember when this appointment is or do not already have it scheduled, please call the office to schedule.  Office Information:  -Ileene Rubens, MD -Phone number: [phone] -Address: [address]       [place], [place] [address]

## 2022-03-10 NOTE — Evaluation (Signed)
Occupational Therapy Evaluation Patient Details Name: Jonathan Melendez MRN: [id number] DOB: 06/10/1970 Today's Date: 03/10/2022   History of Present Illness Pt is a 52 y.o. male s/p ACDF C5-7. PMH significant for ankylosing spondylitis, Type 2 DM, HTN.   Clinical Impression   PTA, pt performing ADL and IADL with mod I. Upon eval, pt performing UB ADL and LB ADL with mod I. Pt educated and demonstrating use of compensatory techniques for bed mobility, dressing, bathing, shower transfer, grooming, toileting, and car transfers within precautions. Pt with dizziness and nausea after performance of ADL during session and during education for brace application. Self-initiated return to EOB for rest break and then supine; provided emesis bag and MAP of 103. Provided education regarding safe return to mobility as pt adjusts to pain medications; significant other present at this time and is RN who feels comfortable navigating this with pt. Recommending discharge home with no OT follow up. OT to sign off. Thank you for this order.      Recommendations for follow up therapy are one component of a multi-disciplinary discharge planning process, led by the attending physician.  Recommendations may be updated based on patient status, additional functional criteria and insurance authorization.   Follow Up Recommendations  No OT follow up     Assistance Recommended at Discharge Intermittent Supervision/Assistance  Patient can return home with the following Assistance with cooking/housework;Assist for transportation;Help with stairs or ramp for entrance    Functional Status Assessment  Patient has had a recent decline in their functional status and demonstrates the ability to make significant improvements in function in a reasonable and predictable amount of time.  Equipment Recommendations  None recommended by OT    Recommendations for Other Services       Precautions / Restrictions  Precautions Precautions: Cervical Precaution Booklet Issued: Yes (comment) Precaution Comments: all precautions reviewed within the context of ADL Required Braces or Orthoses: Cervical Brace Cervical Brace: Hard collar;At all times (except trips to restroom and bathing) Restrictions Weight Bearing Restrictions: No      Mobility Bed Mobility Overal bed mobility: Needs Assistance Bed Mobility: Rolling, Sidelying to Sit, Sit to Sidelying Rolling: Supervision Sidelying to sit: Supervision     Sit to sidelying: Supervision General bed mobility comments: cues for proper technique. Pt reporting he will be sleeping in a recliner    Transfers Overall transfer level: Modified independent                 General transfer comment: Increased time      Balance Overall balance assessment: Modified Independent                                         ADL either performed or assessed with clinical judgement   ADL Overall ADL's : Modified independent                                       General ADL Comments: Mod I. Reviewed all compensatory techniques for bathing, dressing, shower transfer, grooming, pericare, bed mobility, brace application, and cleaning.     Vision Baseline Vision/History: 1 Wears glasses Ability to See in Adequate Light: 0 Adequate Patient Visual Report: No change from baseline Vision Assessment?: No apparent visual deficits     Perception     Praxis  Pertinent Vitals/Pain Pain Assessment Pain Assessment: Faces Faces Pain Scale: Hurts little more Pain Location: operative site Pain Descriptors / Indicators: Discomfort, Operative site guarding, Numbness, Tingling Pain Intervention(s): Limited activity within patient's tolerance, Monitored during session, Repositioned     Hand Dominance     Extremity/Trunk Assessment Upper Extremity Assessment Upper Extremity Assessment: RUE deficits/detail;LUE  deficits/detail RUE Deficits / Details: generalized weakness, numbness following median nerve distribution, decreased dexterity and muscular endurance (could hold 4-/5 MMT for ~2 seconds, then could no longer resist) RUE Sensation: decreased light touch RUE Coordination: decreased fine motor;decreased gross motor LUE Deficits / Details: generalized weakness and muscular endurance; sensation disturbances resolved according to pt. Pt could hold resistance of 4/5 during MMT for ~3-4 seconds   Lower Extremity Assessment Lower Extremity Assessment: Overall WFL for tasks assessed   Cervical / Trunk Assessment Cervical / Trunk Assessment: Neck Surgery   Communication Communication Communication: No difficulties   Cognition Arousal/Alertness: Awake/alert Behavior During Therapy: WFL for tasks assessed/performed Overall Cognitive Status: Within Functional Limits for tasks assessed                                       General Comments  VSS. S/o entering at end of session. She is an Scientist, research (physical sciences) [place] expects to be discharged to:: Private residence Living Arrangements: Spouse/significant other;Parent Available Help at Discharge: Family Type of Home: House Home Access: Stairs to enter [employer] of Steps: 3 Entrance Stairs-Rails: Right Home Layout: Multi-level;Able to live on main level with bedroom/bathroom     Bathroom Shower/Tub: Occupational psychologist: Standard     Home Equipment: Shower seat - built in          Prior Functioning/Environment Prior Level of Function : Independent/Modified Independent             Mobility Comments: no AD ADLs Comments: Was occasionally receiving assist with FM tasks; dropped things often.used compensatory techniques for activities requiring longer muscle contraction (ex: pour glass of milk right next to fridge using two hands)        OT  Problem List: Decreased strength;Decreased activity tolerance;Decreased coordination;Decreased knowledge of use of DME or AE;Decreased knowledge of precautions;Impaired UE functional use      OT Treatment/Interventions:      OT Goals(Current goals can be found in the care plan section) Acute Rehab OT Goals Patient Stated Goal: go home OT Goal Formulation: With patient  OT Frequency:      Co-evaluation              AM-PAC OT "6 Clicks" Daily Activity     Outcome Measure Help from another person eating meals?: None Help from another person taking care of personal grooming?: None Help from another person toileting, which includes using toliet, bedpan, or urinal?: None Help from another person bathing (including washing, rinsing, drying)?: None Help from another person to put on and taking off regular upper body clothing?: None Help from another person to put on and taking off regular lower body clothing?: None 6 Click Score: 24   End of Session Equipment Utilized During Treatment: Cervical collar Nurse Communication: Mobility status  Activity Tolerance: Patient tolerated treatment well Patient left: in bed;with call bell/phone within reach;with family/visitor present  OT Visit Diagnosis: Unsteadiness on feet (R26.81);Muscle weakness (generalized) (M62.81);Pain Pain -  part of body:  (RUE, neck)                Time: 3154-0086 OT Time Calculation (min): 29 min Charges:  OT General Charges $OT Visit: 1 Visit OT Evaluation $OT Eval Low Complexity: 1 Low OT Treatments $Self Care/Home Management : 8-22 mins  Elder Cyphers, OTR/L [facility] Acute Rehabilitation Office: [phone]   Magnus Ivan 03/10/2022, 9:23 AM

## 2022-03-10 NOTE — Progress Notes (Signed)
Pre-operative scores  mJOA - 12 NDI - 42 SF-36:  -Physical functioning: 65  -Role limitations due to physical health: 0  -Role limitations due to emotional health: 0  -Energy/fatigue: 15  -Emotional well-being: 40  -Social functioning: 25  -Pain: 22.5  -General health: [place], MD Orthopedic Surgeon

## 2022-03-12 ENCOUNTER — Encounter: Payer: Self-pay | Admitting: Orthopedic Surgery

## 2022-03-12 ENCOUNTER — Encounter (HOSPITAL_COMMUNITY): Payer: Self-pay | Admitting: Orthopedic Surgery

## 2022-03-12 MED FILL — Thrombin For Soln 5000 Unit: CUTANEOUS | Qty: 5000 | Status: AC

## 2022-03-14 ENCOUNTER — Encounter: Payer: Self-pay | Admitting: Orthopedic Surgery

## 2022-03-15 MED ORDER — METHOCARBAMOL 750 MG PO TABS: 750.0000 mg | ORAL_TABLET | Freq: Four times a day (QID) | ORAL | 0 refills | Status: AC

## 2022-03-15 MED ORDER — OXYCODONE HCL 5 MG PO TABS: 5.0000 mg | ORAL_TABLET | ORAL | 0 refills | Status: AC | PRN

## 2022-03-15 NOTE — Addendum Note (Signed)
Addended by: Ileene Rubens on: 03/15/2022 09:27 AM   Modules accepted: Orders

## 2022-03-23 ENCOUNTER — Ambulatory Visit (INDEPENDENT_AMBULATORY_CARE_PROVIDER_SITE_OTHER): Payer: BC Managed Care – PPO | Admitting: Orthopedic Surgery

## 2022-03-23 ENCOUNTER — Encounter: Payer: Self-pay | Admitting: Orthopedic Surgery

## 2022-03-23 ENCOUNTER — Ambulatory Visit (INDEPENDENT_AMBULATORY_CARE_PROVIDER_SITE_OTHER): Payer: BC Managed Care – PPO

## 2022-03-23 VITALS — BP 128/86 | HR 65

## 2022-03-23 DIAGNOSIS — M542 Cervicalgia: Secondary | ICD-10-CM | POA: Diagnosis not present

## 2022-03-23 MED ORDER — METHOCARBAMOL 500 MG PO TABS: 500.0000 mg | ORAL_TABLET | Freq: Four times a day (QID) | ORAL | 0 refills | Status: AC | PRN

## 2022-03-23 NOTE — Progress Notes (Addendum)
Orthopedic Surgery Post-operative Office Visit  Procedure: C5-7 anterior cervical discectomy and fusion Date of Surgery: 03/09/2022  Assessment: Patient is a 52 y.o. male who is still having some right-sided symptoms but his left-sided symptoms have completely resolved.  Feels he is still making improvement on the right side   Plan: -Operative plans complete -Out of bed as tolerated, cervical collar -No bending/lifting/twisting greater than 10 pounds -Can shower and let soap/water run over the incision.  Do not submerge -Pain management: Robaxin (refilled today), Lyrica, is weaning from oxycodone -Use voltaren gel around the elbow -Return to office in 4 weeks, cervical spine films needed at next visit: AP/lateral cervical  ___________________________________________________________________________   Subjective: Patient feels that his symptoms are better than preop at this point.  He is still having some symptoms on the right side.  He has pain around the right elbow.  He feels it along the triceps tendon near the insertion of the triceps.  He also has pain that radiates down his forearm into the thumb and index finger.  The radiating pain into the thumb and index fingers improve from preop but is still present.  He has no left upper extremity pain.  He is not having any any neck pain.  Has decreased sensation in the thumb index and long finger on the right side.  Denies other numbness or paresthesias.  Had dysphagia immediately postoperatively but that is almost complete resolved.  He said he still has trouble swallowing some things like hard bread. Feels his strength has been improving in the right arm. Has been able to sleep better at night in the last week. Has not noticed any redness or drainage from his incision.  Objective:  General: no acute distress, appropriate affect Neurologic: alert, answering questions appropriately, following commands Respiratory: unlabored breathing on room  air Skin: incision is well-approximated with no surrounding erythema or induration.  There is no active or expressible drainage  MSK (spine):  -Strength exam      Left  Right Grip strength                5/5  5/5 Interosseus   5/5   5/5 Wrist extension  5/5  4+/5 Wrist flexion   5/5  4+/5 Elbow flexion   5/5  4+/5 Deltoid    5/5  5/5  EHL    5/5  5/5 TA    5/5  5/5 GSC    5/5  5/5 Knee extension  5/5  5/5 Hip flexion   5/5  5/5  -Sensory exam    Sensation intact to light touch in L3-S1 nerve distributions of bilateral lower extremities  Sensation intact to light touch in C5-T1 nerve distributions of bilateral upper extremities  -No pain through range of motion at the elbow, able to take elbow through range of motion from full extension to 110 degrees of flexion.  Tender to palpation over the triceps tendon and near its insertion.  No swelling seen over the olecranon  Imaging: X-rays of the cervical spine taken today were independently reviewed and interpreted, showing C5/6 and C6/7 interbody allograft with no interval migration from immediately postop films.  Anterior instrumentation in place with no lucency around the screws.  Screws are not backed out.  No fracture or dislocation seen.   Patient name: Jonathan Melendez Patient MRN: [id number] Date of visit: 03/23/22

## 2022-04-05 ENCOUNTER — Encounter: Payer: Self-pay | Admitting: Orthopedic Surgery

## 2022-04-05 MED ORDER — ACETAMINOPHEN 500 MG PO TABS: 1000.0000 mg | ORAL_TABLET | Freq: Three times a day (TID) | ORAL | 0 refills | Status: AC

## 2022-04-05 MED ORDER — OXYCODONE HCL 5 MG PO TABS: 2.5000 mg | ORAL_TABLET | ORAL | 0 refills | Status: AC | PRN

## 2022-04-19 ENCOUNTER — Ambulatory Visit (INDEPENDENT_AMBULATORY_CARE_PROVIDER_SITE_OTHER): Payer: BC Managed Care – PPO | Admitting: Orthopedic Surgery

## 2022-04-19 ENCOUNTER — Ambulatory Visit (INDEPENDENT_AMBULATORY_CARE_PROVIDER_SITE_OTHER): Payer: BC Managed Care – PPO

## 2022-04-19 DIAGNOSIS — Z981 Arthrodesis status: Secondary | ICD-10-CM

## 2022-04-19 NOTE — Progress Notes (Addendum)
Orthopedic Surgery Post-operative Office Visit   Procedure: C5-7 anterior cervical discectomy and fusion Date of Surgery: 03/09/2022 (~6 weeks post-op)   Assessment: Patient is a 52 y.o. male who feels he has had significant improvement in his preoperative symptoms.  His elbow pain that he mention at the last visit has gotten better but not completely resolved.  He feels his strength and pain have improved since surgery   Plan: -Operative plans complete -Out of bed as tolerated, no brace needed -No bending/lifting/twisting greater than 10 pounds -Okay to submerge wound at this time -Pain management: Over-the-counter medications -Return to office in 6 weeks, cervical spine films needed at next visit: AP/lateral/flex/ex cervical   ___________________________________________________________________________     Subjective: Patient comes in today for routine follow-up after a C5-7 ACDF.  He states that his symptoms have gotten significantly better since surgery.  He is not having any pain in his left upper extremity.  He is not having any neck pain.  He still has a burning sensation in the thumb and index finger but it has improved since surgery and has improved since last time I saw him.  At her last visit, he had mentioned having pain around the right elbow.  It was located around the triceps tendon.  That pain has gotten better but has not complete resolved at this time.  Feels his strength in his upper extremities has improved since surgery as well.  Denies paresthesias and numbness.   Objective:   General: no acute distress, appropriate affect Neurologic: alert, answering questions appropriately, following commands Respiratory: unlabored breathing on room air Skin: incision is well healed with no surrounding erythema or induration.  There is no active or expressible drainage   MSK (spine):   -Strength exam                                                   Left                   Right Grip strength                5/5                  5/5 Interosseus                  5/5                  5/5 Wrist extension            5/5                  5/5 Wrist flexion                 5/5                  5/5 Elbow flexion                5/5                  5/5 Deltoid                          5/5                  5/5   EHL  5/5                  5/5 TA                                 5/5                  5/5 GSC                             5/5                  5/5 Knee extension            5/5                  5/5 Hip flexion                    5/5                  5/5   -Sensory exam                           Sensation intact to light touch in L3-S1 nerve distributions of bilateral lower extremities             Sensation intact to light touch in C5-T1 nerve distributions of bilateral upper extremities  -Right elbow exam: No pain through range of motion, can extend fully and flex to 110 degrees.  No tenderness to palpation around the elbow   Imaging: X-rays of the cervical spine taken today were independently reviewed and interpreted, showing C5/6 and C6/7 interbody allograft with no interval migration. Allograft appears in appropriate position.  Anterior instrumentation in place with no lucency around the screws.  Screws have not backed out.  No fracture or dislocation seen.     Patient name: Jonathan Melendez Patient MRN: [id number] Date of visit: 04/19/22

## 2022-04-20 ENCOUNTER — Encounter: Payer: BC Managed Care – PPO | Admitting: Orthopedic Surgery

## 2022-05-09 DIAGNOSIS — E119 Type 2 diabetes mellitus without complications: Secondary | ICD-10-CM | POA: Diagnosis not present

## 2022-05-09 DIAGNOSIS — Z794 Long term (current) use of insulin: Secondary | ICD-10-CM | POA: Diagnosis not present

## 2022-05-09 DIAGNOSIS — M542 Cervicalgia: Secondary | ICD-10-CM | POA: Diagnosis not present

## 2022-05-09 DIAGNOSIS — I10 Essential (primary) hypertension: Secondary | ICD-10-CM | POA: Diagnosis not present

## 2022-05-09 DIAGNOSIS — E785 Hyperlipidemia, unspecified: Secondary | ICD-10-CM | POA: Diagnosis not present

## 2022-05-13 ENCOUNTER — Other Ambulatory Visit (INDEPENDENT_AMBULATORY_CARE_PROVIDER_SITE_OTHER): Payer: Self-pay | Admitting: Orthopedic Surgery

## 2022-05-14 ENCOUNTER — Encounter: Payer: Self-pay | Admitting: Orthopedic Surgery

## 2022-05-15 MED ORDER — PREGABALIN 50 MG PO CAPS: 50.0000 mg | ORAL_CAPSULE | Freq: Three times a day (TID) | ORAL | 2 refills | Status: DC

## 2022-05-25 DIAGNOSIS — M450 Ankylosing spondylitis of multiple sites in spine: Secondary | ICD-10-CM | POA: Diagnosis not present

## 2022-05-25 DIAGNOSIS — M542 Cervicalgia: Secondary | ICD-10-CM | POA: Diagnosis not present

## 2022-05-25 DIAGNOSIS — Z111 Encounter for screening for respiratory tuberculosis: Secondary | ICD-10-CM | POA: Diagnosis not present

## 2022-05-31 ENCOUNTER — Other Ambulatory Visit (INDEPENDENT_AMBULATORY_CARE_PROVIDER_SITE_OTHER): Payer: BC Managed Care – PPO

## 2022-05-31 ENCOUNTER — Ambulatory Visit (INDEPENDENT_AMBULATORY_CARE_PROVIDER_SITE_OTHER): Payer: BC Managed Care – PPO | Admitting: Orthopedic Surgery

## 2022-05-31 DIAGNOSIS — Z981 Arthrodesis status: Secondary | ICD-10-CM

## 2022-05-31 NOTE — Progress Notes (Signed)
3 Month Post-operative Scores  mJOA: 16 NDI: 14 SF-36:   -Physical functioning: 95  -Role limitations due to physical health: 100  -Role limitations due to emotional problems: 100  -Energy/fatigue: 60  -Emotional well-being: 72  -Social functioning: 75  -Pain: 77.5  -General health: 55  Pre-operative scores   mJOA - 12 NDI - 42 SF-36:             -Physical functioning: 65             -Role limitations due to physical health: 0             -Role limitations due to emotional health: 0             -Energy/fatigue: 15             -Emotional well-being: 40             -Social functioning: 25             -Pain: 22.5             -General health: 76   London Sheer, MD Orthopedic Surgeon

## 2022-05-31 NOTE — Progress Notes (Signed)
Orthopedic Surgery Post-operative Office Visit   Procedure: C5-7 anterior cervical discectomy and fusion Date of Surgery: 03/09/2022 (~3 months postop)   Assessment: Patient is a 52 y.o. male who is doing well but still has some decreased sensation in the bilateral thumb and index fingers     Plan: -Operative plans complete -Out of bed as tolerated, no brace needed -No spine specific restrictionsat this point -Okay to submerge wound  -Pain management: OTC medications -Explained that sensation is the last neurologic symptoms and that he may not get full return of sensation, but we we will continue to monitor at this time -Return to office in 12 weeks, cervical spine films needed at next visit: AP/lateral/flex/ex cervical   ___________________________________________________________________________     Subjective: Patient's pain has resolved since surgery.  He has been able to return to work without issue.  He does note decreased sensation in the dorsal hand on the radial aspect and in the thumb and index finger bilaterally.  His decreased sensation is more significant on the right side.  He feels that that has been getting better with time now.  He feels his strength is back to normal in both of his arms.  He has not had any issues with swallowing or pain with swallowing.  No other paresthesias or numbness except as described above.  Overall, he is pleased with his outcome at this point.   Objective:   General: no acute distress, appropriate affect Neurologic: alert, answering questions appropriately, following commands Respiratory: unlabored breathing on room air Skin: incision is well healed with no evidence of infection   MSK (spine):   -Strength exam                                                   Left                  Right Grip strength                5/5                  5/5 Interosseus                  5/5                  5/5 Wrist extension            5/5                   5/5 Wrist flexion                 5/5                  5/5 Elbow flexion                5/5                  5/5 Deltoid                          5/5                  5/5   EHL  5/5                  5/5 TA                                 5/5                  5/5 GSC                             5/5                  5/5 Knee extension            5/5                  5/5 Hip flexion                    5/5                  5/5   -Sensory exam                           Sensation intact to light touch in L3-S1 nerve distributions of bilateral lower extremities             Sensation intact to light touch in C5-T1 nerve distributions of bilateral upper extremities (decreased sensation in thumb and index finger bilaterally)    Imaging: X-rays of the cervical spine taken 05/31/2022 were independently reviewed and interpreted, showing C5/6 and C6/7 interbody allograft with no interval migration from immediately postop films.  Anterior instrumentation in place with no lucency around the screws.  Screws are not backed out.  Interspinous distance between flexion and extension views at C5/6 is 1 mm and is 0.5 mm at C6/7.  There is 6 mm of change in interspinous distance between C4 and C5 between the flexion and extension views.     Patient name: Jonathan Melendez Patient MRN: [id number] Date of visit: 05/31/22

## 2022-06-26 ENCOUNTER — Encounter: Payer: Self-pay | Admitting: Orthopedic Surgery

## 2022-06-26 MED ORDER — PREGABALIN 50 MG PO CAPS: 50.0000 mg | ORAL_CAPSULE | Freq: Three times a day (TID) | ORAL | 2 refills | Status: DC

## 2022-08-30 ENCOUNTER — Ambulatory Visit: Payer: BC Managed Care – PPO | Admitting: Orthopedic Surgery

## 2022-10-12 DIAGNOSIS — Z794 Long term (current) use of insulin: Secondary | ICD-10-CM | POA: Diagnosis not present

## 2022-10-12 DIAGNOSIS — Z125 Encounter for screening for malignant neoplasm of prostate: Secondary | ICD-10-CM | POA: Diagnosis not present

## 2022-10-12 DIAGNOSIS — E119 Type 2 diabetes mellitus without complications: Secondary | ICD-10-CM | POA: Diagnosis not present

## 2022-10-12 DIAGNOSIS — Z0000 Encounter for general adult medical examination without abnormal findings: Secondary | ICD-10-CM | POA: Diagnosis not present

## 2022-10-23 DIAGNOSIS — E1165 Type 2 diabetes mellitus with hyperglycemia: Secondary | ICD-10-CM | POA: Diagnosis not present

## 2022-10-23 DIAGNOSIS — I10 Essential (primary) hypertension: Secondary | ICD-10-CM | POA: Diagnosis not present

## 2022-10-23 DIAGNOSIS — E785 Hyperlipidemia, unspecified: Secondary | ICD-10-CM | POA: Diagnosis not present

## 2022-10-23 DIAGNOSIS — R4702 Dysphasia: Secondary | ICD-10-CM | POA: Diagnosis not present

## 2022-10-23 DIAGNOSIS — Z23 Encounter for immunization: Secondary | ICD-10-CM | POA: Diagnosis not present

## 2022-11-12 DIAGNOSIS — K219 Gastro-esophageal reflux disease without esophagitis: Secondary | ICD-10-CM | POA: Diagnosis not present

## 2022-11-12 DIAGNOSIS — E1165 Type 2 diabetes mellitus with hyperglycemia: Secondary | ICD-10-CM | POA: Diagnosis not present

## 2022-11-12 DIAGNOSIS — R1310 Dysphagia, unspecified: Secondary | ICD-10-CM | POA: Diagnosis not present

## 2022-11-22 DIAGNOSIS — R1310 Dysphagia, unspecified: Secondary | ICD-10-CM | POA: Diagnosis not present

## 2022-11-22 DIAGNOSIS — K219 Gastro-esophageal reflux disease without esophagitis: Secondary | ICD-10-CM | POA: Diagnosis not present

## 2022-11-23 DIAGNOSIS — M450 Ankylosing spondylitis of multiple sites in spine: Secondary | ICD-10-CM | POA: Diagnosis not present

## 2022-11-23 DIAGNOSIS — E7849 Other hyperlipidemia: Secondary | ICD-10-CM | POA: Diagnosis not present

## 2022-11-23 DIAGNOSIS — M542 Cervicalgia: Secondary | ICD-10-CM | POA: Diagnosis not present

## 2022-12-11 DIAGNOSIS — Z794 Long term (current) use of insulin: Secondary | ICD-10-CM | POA: Diagnosis not present

## 2022-12-11 DIAGNOSIS — E119 Type 2 diabetes mellitus without complications: Secondary | ICD-10-CM | POA: Diagnosis not present

## 2022-12-11 DIAGNOSIS — Z23 Encounter for immunization: Secondary | ICD-10-CM | POA: Diagnosis not present

## 2022-12-11 DIAGNOSIS — R001 Bradycardia, unspecified: Secondary | ICD-10-CM | POA: Diagnosis not present

## 2022-12-11 DIAGNOSIS — Z7984 Long term (current) use of oral hypoglycemic drugs: Secondary | ICD-10-CM | POA: Diagnosis not present

## 2022-12-11 DIAGNOSIS — R002 Palpitations: Secondary | ICD-10-CM | POA: Diagnosis not present

## 2022-12-11 DIAGNOSIS — E1165 Type 2 diabetes mellitus with hyperglycemia: Secondary | ICD-10-CM | POA: Diagnosis not present

## 2022-12-13 DIAGNOSIS — Z111 Encounter for screening for respiratory tuberculosis: Secondary | ICD-10-CM | POA: Diagnosis not present

## 2022-12-13 DIAGNOSIS — R002 Palpitations: Secondary | ICD-10-CM | POA: Diagnosis not present

## 2022-12-13 DIAGNOSIS — R001 Bradycardia, unspecified: Secondary | ICD-10-CM | POA: Diagnosis not present

## 2023-03-04 DIAGNOSIS — E7849 Other hyperlipidemia: Secondary | ICD-10-CM | POA: Diagnosis not present

## 2023-03-04 DIAGNOSIS — M450 Ankylosing spondylitis of multiple sites in spine: Secondary | ICD-10-CM | POA: Diagnosis not present

## 2023-03-04 DIAGNOSIS — M542 Cervicalgia: Secondary | ICD-10-CM | POA: Diagnosis not present

## 2023-03-04 DIAGNOSIS — M25569 Pain in unspecified knee: Secondary | ICD-10-CM | POA: Diagnosis not present

## 2023-03-15 DIAGNOSIS — Z23 Encounter for immunization: Secondary | ICD-10-CM | POA: Diagnosis not present

## 2023-03-15 DIAGNOSIS — I10 Essential (primary) hypertension: Secondary | ICD-10-CM | POA: Diagnosis not present

## 2023-03-15 DIAGNOSIS — E785 Hyperlipidemia, unspecified: Secondary | ICD-10-CM | POA: Diagnosis not present

## 2023-03-15 DIAGNOSIS — E1165 Type 2 diabetes mellitus with hyperglycemia: Secondary | ICD-10-CM | POA: Diagnosis not present

## 2023-03-15 DIAGNOSIS — Z794 Long term (current) use of insulin: Secondary | ICD-10-CM | POA: Diagnosis not present

## 2023-03-25 ENCOUNTER — Other Ambulatory Visit (HOSPITAL_BASED_OUTPATIENT_CLINIC_OR_DEPARTMENT_OTHER): Payer: Self-pay

## 2023-03-25 ENCOUNTER — Emergency Department (HOSPITAL_BASED_OUTPATIENT_CLINIC_OR_DEPARTMENT_OTHER)
Admission: EM | Admit: 2023-03-25 | Discharge: 2023-03-25 | Disposition: A | Discharge status: Home / Self Care | Payer: BC Managed Care – PPO

## 2023-03-25 ENCOUNTER — Emergency Department (HOSPITAL_BASED_OUTPATIENT_CLINIC_OR_DEPARTMENT_OTHER): Payer: BC Managed Care – PPO

## 2023-03-25 ENCOUNTER — Other Ambulatory Visit: Payer: Self-pay

## 2023-03-25 DIAGNOSIS — Z794 Long term (current) use of insulin: Secondary | ICD-10-CM | POA: Diagnosis not present

## 2023-03-25 DIAGNOSIS — R519 Headache, unspecified: Secondary | ICD-10-CM | POA: Diagnosis not present

## 2023-03-25 DIAGNOSIS — Z79899 Other long term (current) drug therapy: Secondary | ICD-10-CM | POA: Diagnosis not present

## 2023-03-25 DIAGNOSIS — E1165 Type 2 diabetes mellitus with hyperglycemia: Secondary | ICD-10-CM | POA: Diagnosis not present

## 2023-03-25 DIAGNOSIS — R202 Paresthesia of skin: Secondary | ICD-10-CM | POA: Insufficient documentation

## 2023-03-25 DIAGNOSIS — Z7984 Long term (current) use of oral hypoglycemic drugs: Secondary | ICD-10-CM | POA: Diagnosis not present

## 2023-03-25 DIAGNOSIS — I10 Essential (primary) hypertension: Secondary | ICD-10-CM | POA: Diagnosis not present

## 2023-03-25 DIAGNOSIS — R29818 Other symptoms and signs involving the nervous system: Secondary | ICD-10-CM | POA: Diagnosis not present

## 2023-03-25 LAB — URINALYSIS, ROUTINE W REFLEX MICROSCOPIC
Bacteria, UA: NONE SEEN
Bilirubin Urine: NEGATIVE
Glucose, UA: 500 mg/dL — AB
Hgb urine dipstick: NEGATIVE
Ketones, ur: 15 mg/dL — AB
Leukocytes,Ua: NEGATIVE
Nitrite: NEGATIVE
Specific Gravity, Urine: 1.027 (ref 1.005–1.030)
pH: 5.5 (ref 5.0–8.0)

## 2023-03-25 LAB — BASIC METABOLIC PANEL WITH GFR
Anion gap: 12 (ref 5–15)
BUN: 10 mg/dL (ref 6–20)
CO2: 24 mmol/L (ref 22–32)
Calcium: 9.9 mg/dL (ref 8.9–10.3)
Chloride: 103 mmol/L (ref 98–111)
Creatinine, Ser: 0.91 mg/dL (ref 0.61–1.24)
GFR, Estimated: >60 mL/min (ref >60)
Glucose, Bld: 153 mg/dL — ABNORMAL HIGH (ref 70–99)
Potassium: 4.2 mmol/L (ref 3.5–5.1)
Sodium: 139 mmol/L (ref 135–145)

## 2023-03-25 LAB — CBC
HCT: 50.1 % (ref 39.0–52.0)
Hemoglobin: 17.1 g/dL — ABNORMAL HIGH (ref 13.0–17.0)
MCH: 29.9 pg (ref 26.0–34.0)
MCHC: 34.1 g/dL (ref 30.0–36.0)
MCV: 87.6 fL (ref 80.0–100.0)
Platelets: 251 K/uL (ref 150–400)
RBC: 5.72 MIL/uL (ref 4.22–5.81)
RDW: 12.7 % (ref 11.5–15.5)
WBC: 8.8 K/uL (ref 4.0–10.5)
nRBC: 0 % (ref 0.0–0.2)

## 2023-03-25 LAB — CBG MONITORING, ED: Glucose-Capillary: 144 mg/dL — ABNORMAL HIGH (ref 70–99)

## 2023-03-25 MED ORDER — KETOROLAC TROMETHAMINE 15 MG/ML IJ SOLN
15.0000 mg | Freq: Once | INTRAMUSCULAR | Status: AC
  Administered 2023-03-25: 15 mg via INTRAVENOUS
  Filled 2023-03-25: qty 1

## 2023-03-25 MED ORDER — DIPHENHYDRAMINE HCL 50 MG/ML IJ SOLN
12.5000 mg | Freq: Once | INTRAMUSCULAR | Status: AC
  Administered 2023-03-25: 12.5 mg via INTRAVENOUS
  Filled 2023-03-25: qty 1

## 2023-03-25 MED ORDER — SODIUM CHLORIDE 0.9 % IV BOLUS
1000.0000 mL | Freq: Once | INTRAVENOUS | Status: AC
  Administered 2023-03-25: 1000 mL via INTRAVENOUS

## 2023-03-25 MED ORDER — PROCHLORPERAZINE EDISYLATE 10 MG/2ML IJ SOLN
10.0000 mg | Freq: Once | INTRAMUSCULAR | Status: AC
  Administered 2023-03-25: 10 mg via INTRAVENOUS
  Filled 2023-03-25: qty 2

## 2023-03-25 NOTE — ED Provider Notes (Signed)
EMERGENCY DEPARTMENT AT [facility] Provider Note   CSN: [id number] Arrival date & time: 03/25/23  4098     History  Chief Complaint  Patient presents with   Headache    Jonathan Melendez is a 53 y.o. male.  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  States that headache began approximately 3 days ago.  Was not sudden in onset.  Localized behind the left eye.  Described as a pressure.  No history of headaches.  No vision changes.  No fevers, chills, worsened neck stiffness.  No nausea or vomiting.  He reports photosensitivity as well.  Reports he woke up this morning with a tingling sensation to the left hand that begins at the wrist and radiates into the palm.  It spares the pinky finger.  Has a history of similar on the right hand due to his spinal stenosis.  Denies any weakness.   Headache Associated symptoms: numbness        Home Medications Prior to Admission medications   Medication Sig Start Date End Date Taking? Authorizing Provider  amLODipine (NORVASC) 5 MG tablet Take 5 mg by mouth daily.    [provider]  atorvastatin (LIPITOR) 20 MG tablet Take 20 mg by mouth daily. 05/08/21   [provider]  insulin glargine (LANTUS SOLOSTAR) 100 UNIT/ML Solostar Pen Inject 20 Units into the skin daily. 10 units at 0530 02/14/22   [provider]  metFORMIN (GLUCOPHAGE-XR) 500 MG 24 hr tablet Take 1,000 mg by mouth in the morning and at bedtime.    [provider]  metoprolol succinate (TOPROL-XL) 50 MG 24 hr tablet Take 50 mg by mouth daily.    [provider]  Multiple Vitamin (MULTIVITAMIN ADULT PO) Take 1 tablet by mouth daily.    [provider]  nitroGLYCERIN (NITROSTAT) 0.4 MG SL tablet Place 1 tablet (0.4 mg total) under the tongue every 5  (five) minutes as needed for chest pain. Max 3 doses in 15 minutes Patient taking differently: Place 0.4 mg under the tongue every 5 (five) minutes as needed for chest pain. Max 3 doses in 15 minutes Never used before 07/24/19 03/01/22  Runell Gess, MD  pregabalin (LYRICA) 50 MG capsule Take 1 capsule (50 mg total) by mouth 3 (three) times daily. 06/26/22 09/24/22  London Sheer, MD  tamsulosin (FLOMAX) 0.4 MG CAPS capsule Take 1 capsule (0.4 mg total) by mouth daily. Once daily until stone passes Patient not taking: Reported on 03/01/2022 07/18/21   Roemhildt, Lorin T, PA-C      Allergies    Patient has no known allergies.    Review of Systems   Review of Systems  Neurological:  Positive for numbness and headaches.  All other systems reviewed and are negative.   Physical Exam Updated Vital Signs BP 119/80   Pulse (!) 55   Temp 98.4 F (36.9 C)   Resp 15   Ht 5\' 8"  (1.727 m)   Wt 83.9 kg   SpO2 97%   BMI 28.13 kg/m  Physical Exam Vitals and nursing note reviewed.  Constitutional:      General: He is not in acute distress.    Appearance: Normal appearance. He is normal weight. He is not ill-appearing.  HENT:     Head: Normocephalic and atraumatic.  Pulmonary:     Effort:  Pulmonary effort is normal. No respiratory distress.  Abdominal:     General: Abdomen is flat.  Musculoskeletal:        General: Normal range of motion.     Cervical back: Neck supple.  Skin:    General: Skin is warm and dry.  Neurological:     Mental Status: He is alert and oriented to person, place, and time.     Comments:   MENTAL STATUS: AAOx3   LANG/SPEECH: Fluent, intact naming, repetition & comprehension   CRANIAL NERVES:   II: Pupils equal and reactive   III, IV, VI: EOM intact, no gaze preference or deviation, no nystagmus   V: normal sensation of the face   VII: no facial asymmetry   VIII: normal hearing to speech   MOTOR: 5/5 in both upper and lower extremities   SENSORY:  Significantly diminished sensation to right fingers, slightly diminished sensation to the left fingers sparing the pinky finger   COORD: Normal finger to nose, heel to shin and shoulder shrug, no tremor, no dysmetria. No pronator drift   Psychiatric:        Mood and Affect: Mood normal.        Behavior: Behavior normal.     ED Results / Procedures / Treatments   Labs (all labs ordered are listed, but only abnormal results are displayed) Labs Reviewed  BASIC METABOLIC PANEL - Abnormal; Notable for the following components:      Result Value   Glucose, Bld 153 (*)    All other components within normal limits  CBC - Abnormal; Notable for the following components:   Hemoglobin 17.1 (*)    All other components within normal limits  URINALYSIS, ROUTINE W REFLEX MICROSCOPIC - Abnormal; Notable for the following components:   Glucose, UA 500 (*)    Ketones, ur 15 (*)    Protein, ur TRACE (*)    All other components within normal limits  CBG MONITORING, ED - Abnormal; Notable for the following components:   Glucose-Capillary 144 (*)    All other components within normal limits    EKG None  Radiology CT Head Wo Contrast Result Date: 03/25/2023 CLINICAL DATA:  Headache with neuro deficit.  Headache for 3-4 days. EXAM: CT HEAD WITHOUT CONTRAST TECHNIQUE: Contiguous axial images were obtained from the base of the skull through the vertex without intravenous contrast. RADIATION DOSE REDUCTION: This exam was performed according to the departmental dose-optimization program which includes automated exposure control, adjustment of the mA and/or kV according to patient size and/or use of iterative reconstruction technique. COMPARISON:  07/06/2019 FINDINGS: Brain: No evidence of acute infarction, hemorrhage, hydrocephalus, extra-axial collection or mass lesion/mass effect. Vascular: No hyperdense vessel or unexpected calcification. Skull: Normal. Negative for fracture or focal lesion. Sinuses/Orbits:  No acute finding. IMPRESSION: Negative head CT. Electronically Signed   By: Tiburcio Pea M.D.   On: 03/25/2023 09:47    Procedures Procedures    Medications Ordered in ED Medications  ketorolac (TORADOL) 15 MG/ML injection 15 mg (15 mg Intravenous Given 03/25/23 1019)  sodium chloride 0.9 % bolus 1,000 mL (0 mLs Intravenous Stopped 03/25/23 1127)  prochlorperazine (COMPAZINE) injection 10 mg (10 mg Intravenous Given 03/25/23 1021)  diphenhydrAMINE (BENADRYL) injection 12.5 mg (12.5 mg Intravenous Given 03/25/23 1018)    ED Course/ Medical Decision Making/ A&P  Medical Decision Making Amount and/or Complexity of Data Reviewed Labs: ordered. Radiology: ordered.  Risk Prescription drug management.  This patient presents to the ED for concern of headache, this involves an extensive number of treatment options, and is a complaint that carries with it a high risk of complications and morbidity. Emergent considerations for headache include subarachnoid hemorrhage, meningitis, temporal arteritis, glaucoma, cerebral ischemia, carotid/vertebral dissection, intracranial tumor, Venous sinus thrombosis, carbon monoxide poisoning, acute or chronic subdural hemorrhage.  Other considerations include: Migraine, Cluster headache, Hypertension, Caffeine, alcohol, or drug withdrawal, Pseudotumor cerebri, Arteriovenous malformation, Head injury, Neurocysticercosis, Post-lumbar puncture, Preeclampsia, Tension headache, Sinusitis, Cervical arthritis, Refractive error causing strain, Dental abscess, Otitis media, Temporomandibular joint syndrome, Depression, Somatoform disorder (eg, somatization) Trigeminal neuralgia, Glossopharyngeal neuralgia.   My initial workup includes labs, imaging, symptom control  Additional history obtained from: Nursing notes from this visit. Previous records within EMR system office visit with Dr. Christell Constant on 05/31/2022  I ordered, reviewed and interpreted  labs which include: BMP, CBC, urinalysis.  Labs within normal limits other than hyperglycemia of 153 which is nonfasting  I ordered imaging studies including CT head I independently visualized and interpreted imaging which showed negative I agree with the radiologist interpretation  Afebrile, hemodynamically stable.  53 year old male presenting to the ED for evaluation of a headache and left hand numbness.  Headache began 3 to 4 days ago.  Left hand numbness began this morning.  Numbness is described as a tingling sensation.  He does have intact although slightly diminished sensation to the left hand.  Has a history of similar on the right side due to his cervical stenosis.  He appears otherwise well on physical exam.  No other focal neurologic deficits.  Headache appears consistent with a migraine.  Overall suspect complex migraine.  CT head negative.  Lower suspicion for acute intracranial abnormality.  Patient reports significant improvement after treatment in the emergency department.  He was encouraged to follow-up closely with his orthopedic provider.  He was given strict return precautions.  Stable at discharge.  At this time there does not appear to be any evidence of an acute emergency medical condition and the patient appears stable for discharge with appropriate outpatient follow up. Diagnosis was discussed with patient who verbalizes understanding of care plan and is agreeable to discharge. I have discussed return precautions with patient who verbalizes understanding. Patient encouraged to follow-up with their PCP within 1 week. All questions answered.  Patient's case discussed with Dr. Maple Hudson who agrees with plan to discharge with follow-up.   Note: Portions of this report may have been transcribed using voice recognition software. Every effort was made to ensure accuracy; however, inadvertent computerized transcription errors may still be present.         Final Clinical Impression(s)  / ED Diagnoses Final diagnoses:  Bad headache  Left hand paresthesia    Rx / DC Orders ED Discharge Orders     None         Michelle Piper, PA-C 03/25/23 1137    Coral Spikes, DO 03/25/23 1615

## 2023-03-25 NOTE — ED Triage Notes (Signed)
Pt caox4, ambulatory c/o headache 3-4 days, fatigue and numbness in L hand woke up with this morning at 0500, LKW went to bed at 2200 last night, blurred vision in L eye since headache began.

## 2023-03-25 NOTE — Discharge Instructions (Signed)
You have been seen today for your complaint of headache, left hand numbness. Your lab work was reassuring. Your imaging was reassuring. Follow up with: Your primary care provider and orthopedic provider Please seek immediate medical care if you develop any of the following symptoms: Your migraine headache becomes severe or lasts more than 72 hours. You have a fever or stiff neck. You have vision loss. Your muscles feel weak or like you cannot control them. You lose your balance often or have trouble walking. You faint. You have a seizure. At this time there does not appear to be the presence of an emergent medical condition, however there is always the potential for conditions to change. Please read and follow the below instructions.  Do not take your medicine if  develop an itchy rash, swelling in your mouth or lips, or difficulty breathing; call 911 and seek immediate emergency medical attention if this occurs.  You may review your lab tests and imaging results in their entirety on your MyChart account.  Please discuss all results of fully with your primary care provider and other specialist at your follow-up visit.  Note: Portions of this text may have been transcribed using voice recognition software. Every effort was made to ensure accuracy; however, inadvertent computerized transcription errors may still be present.

## 2023-03-25 NOTE — ED Notes (Signed)
 Discharge paperwork given and verbally understood.

## 2023-06-12 DIAGNOSIS — E1165 Type 2 diabetes mellitus with hyperglycemia: Secondary | ICD-10-CM | POA: Diagnosis not present

## 2023-06-12 DIAGNOSIS — Z794 Long term (current) use of insulin: Secondary | ICD-10-CM | POA: Diagnosis not present

## 2023-06-12 DIAGNOSIS — I10 Essential (primary) hypertension: Secondary | ICD-10-CM | POA: Diagnosis not present

## 2023-06-12 DIAGNOSIS — R109 Unspecified abdominal pain: Secondary | ICD-10-CM | POA: Diagnosis not present

## 2023-06-12 DIAGNOSIS — E785 Hyperlipidemia, unspecified: Secondary | ICD-10-CM | POA: Diagnosis not present

## 2023-06-12 DIAGNOSIS — M459 Ankylosing spondylitis of unspecified sites in spine: Secondary | ICD-10-CM | POA: Diagnosis not present

## 2023-06-12 LAB — HEMOGLOBIN A1C: Hemoglobin A1C: 8.6

## 2023-06-12 LAB — MICROALBUMIN / CREATININE URINE RATIO: Microalb Creat Ratio: 16

## 2023-06-20 IMAGING — CT CT ABD-PELV W/ CM
2 of 5 series · 15 of 46 positions shown, 17 images · IV contrast (APPLIED)
Comparison: CT abdomen pelvis 06/01/2020, CT 08/04/2019

CLINICAL DATA: Abdominal pain, acute, nonlocalized abd pain

EXAM:
CT ABDOMEN AND PELVIS WITH CONTRAST
TECHNIQUE: Multidetector CT imaging of the abdomen and pelvis was performed
using the standard protocol following bolus administration of
intravenous contrast.

[Series 2: abd pel w · axial · 0.74mm/px · z∈[+798,+1213]mm · 12 of 95 slices shown, 14 images]
[im 6/95  soft-tissue]
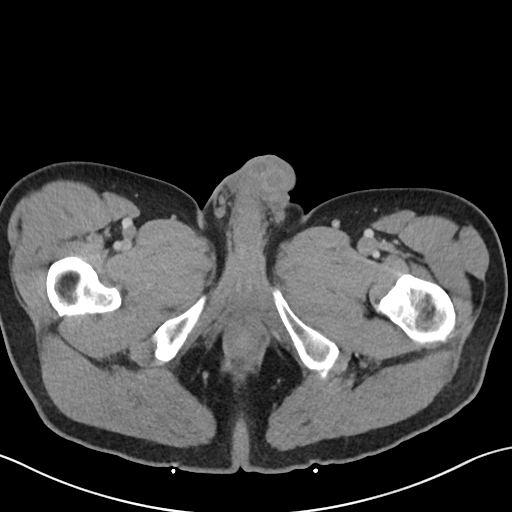
[im 6/95  bone]
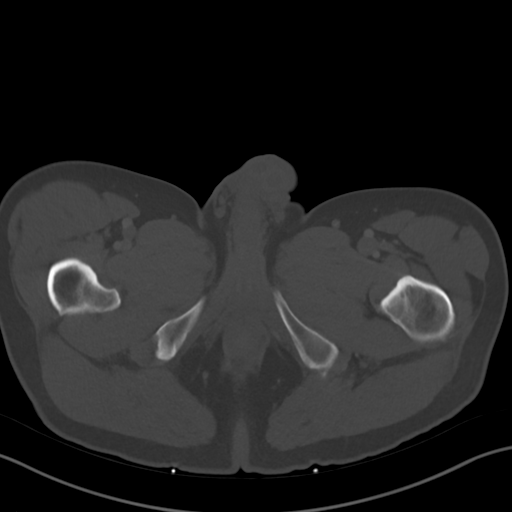
[im 16/95  soft-tissue]
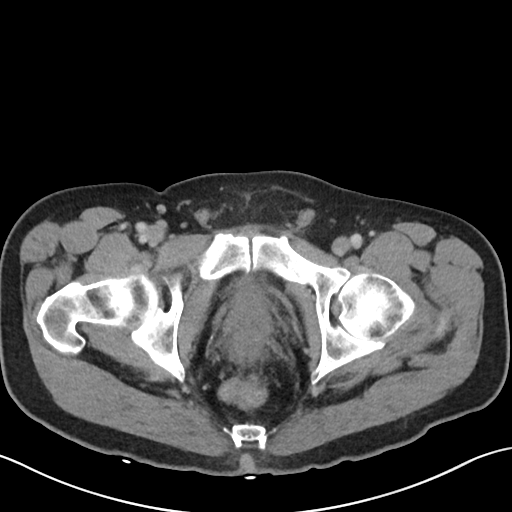
[im 21/95  soft-tissue]
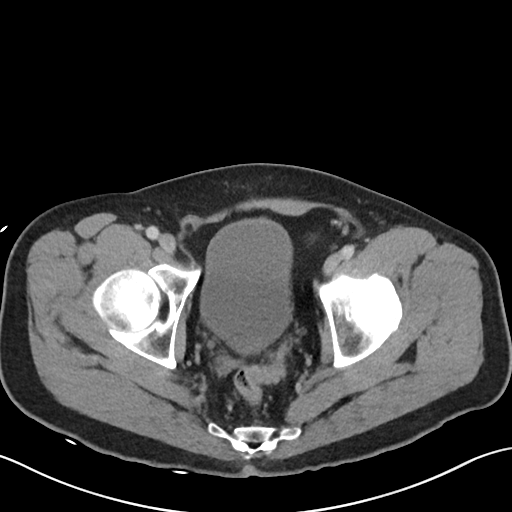
[im 27/95  soft-tissue]
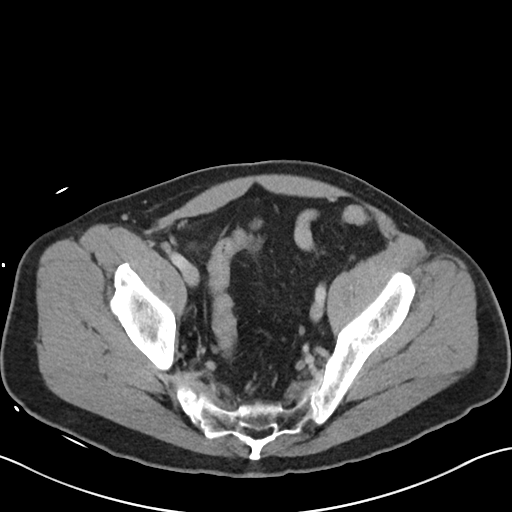
[im 37/95  soft-tissue]
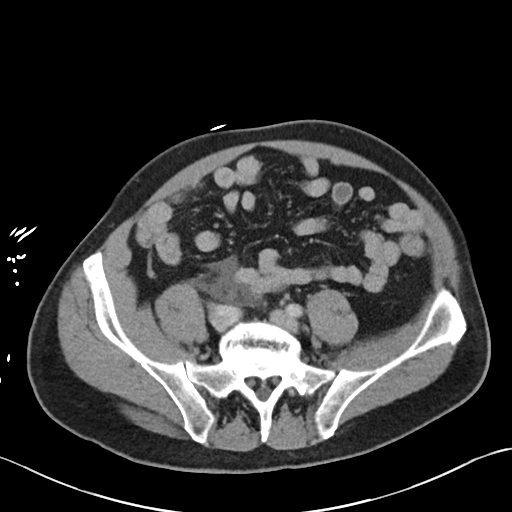
[im 42/95  soft-tissue]
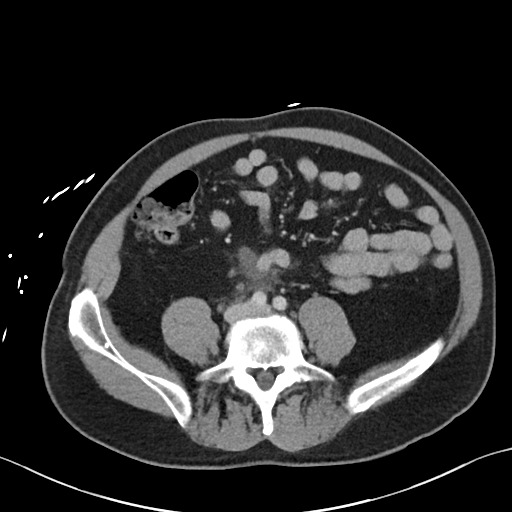
[im 53/95  soft-tissue]
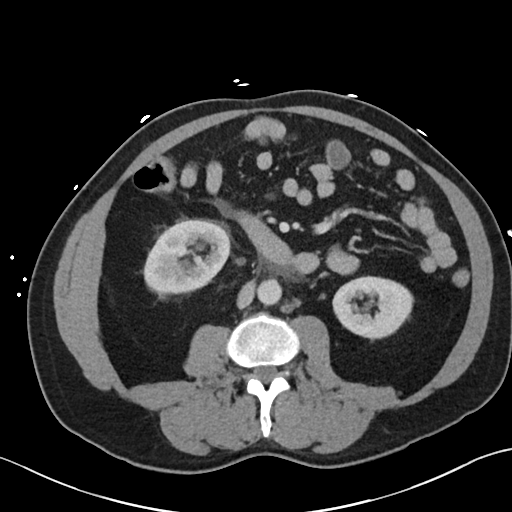
[im 58/95  soft-tissue]
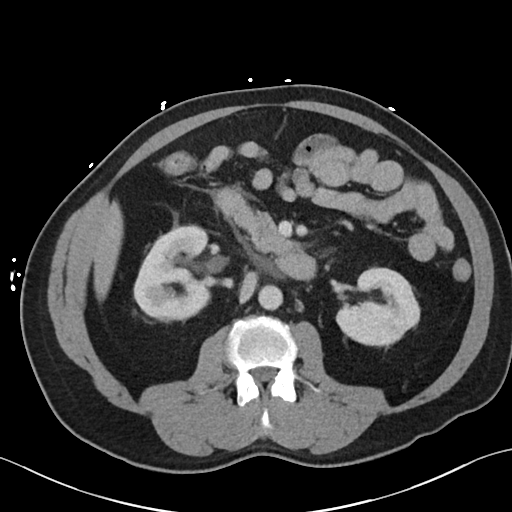
[im 68/95  soft-tissue]
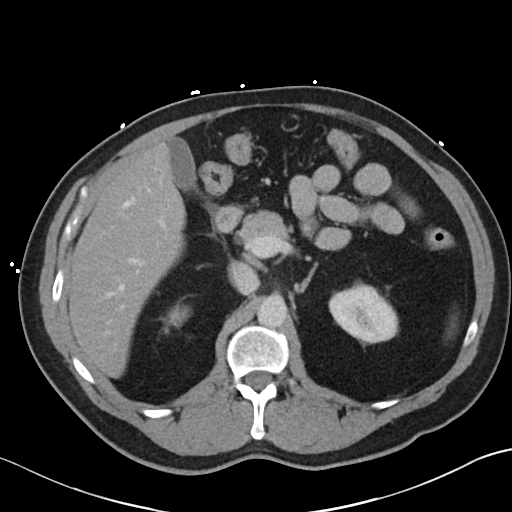
[im 68/95  bone]
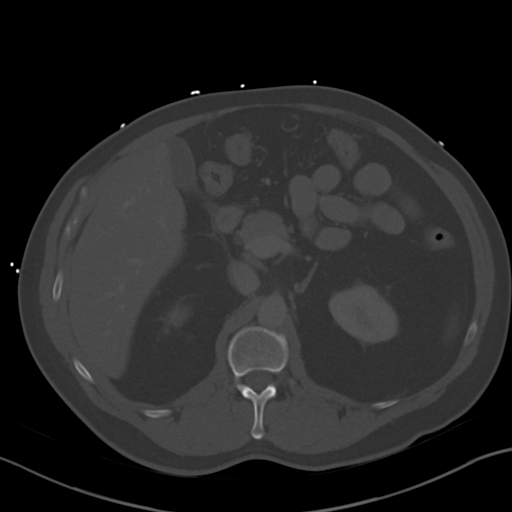
[im 74/95  soft-tissue]
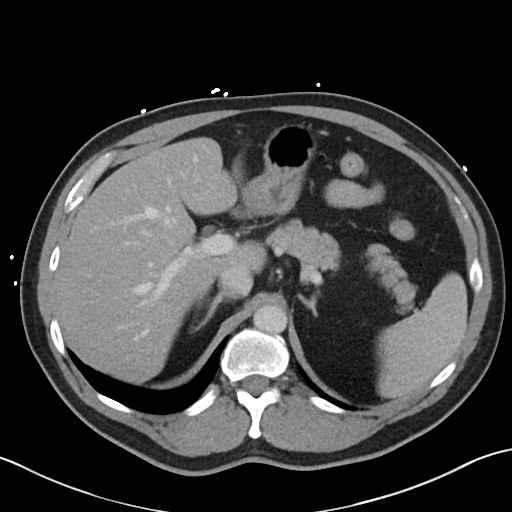
[im 79/95  soft-tissue]
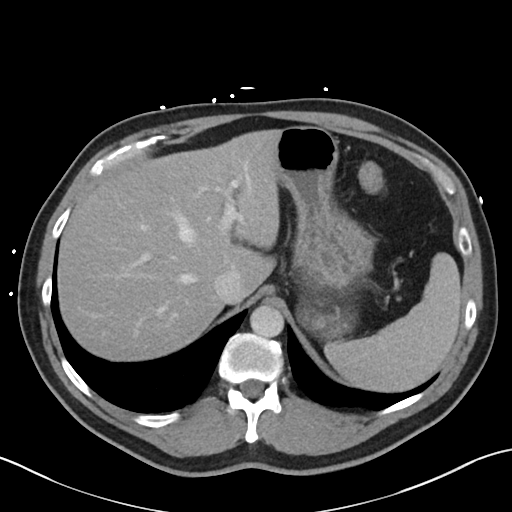
[im 89/95  soft-tissue]
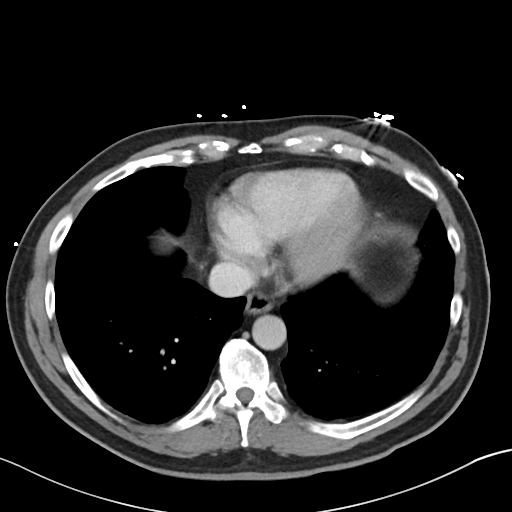

[Series 5: coronal · coronal · 0.79mm/px · 3 of 101 slices shown]
[im 34/101  soft-tissue]
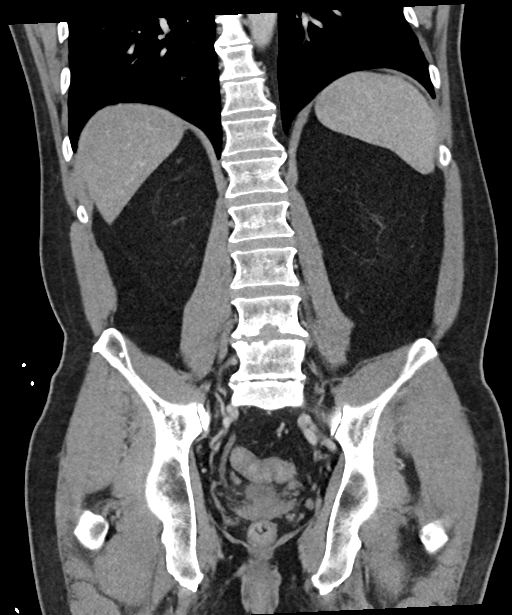
[im 45/101  soft-tissue]
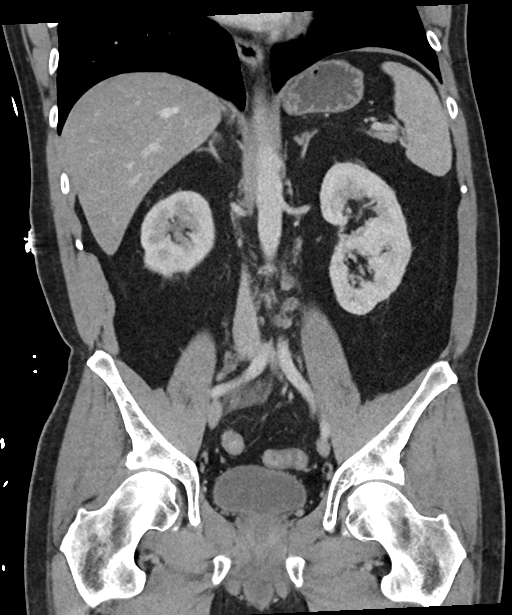
[im 56/101  soft-tissue]
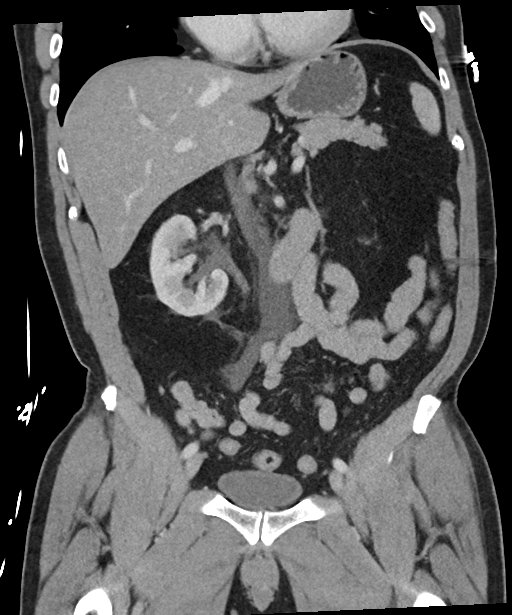

[15 of 46 positions shown; findings below may reference images not displayed]

RADIATION DOSE REDUCTION: This exam was performed according to the
departmental dose-optimization program which includes automated
exposure control, adjustment of the mA and/or kV according to
patient size and/or use of iterative reconstruction technique.

CONTRAST:  85mL OMNIPAQUE IOHEXOL 300 MG/ML  SOLN
FINDINGS: Lower chest: There is a 5 mm right lower lobe pulmonary nodule
(series 2, image 14), which is stable dating back to July 2019.

Hepatobiliary: Slight decreased liver attenuation favored to be due
to contrast timing. No focal liver lesion. The gallbladder is
unremarkable.

Pancreas: Unremarkable. No pancreatic ductal dilatation or
surrounding inflammatory changes.

Spleen: Normal in size without focal abnormality.

Adrenals/Urinary Tract: Adrenal glands are unremarkable. There is
right-sided hydroureteronephrosis with perinephric and periureteral
stranding due to a cluster of obstructing small stones at the right
ureterovesicular junction (series 2, image 77). There is adjacent
fluid tracking in the retroperitoneum along the retroperitoneal
portions of the duodenum, and inferiorly with a significant portion
of this fluid tracking along the distal right ureter, this fluid is
all likely reactive. There is an additional punctate nonobstructive
stone in the right mid kidney. No left-sided hydronephrosis or
nephrolithiasis.

Stomach/Bowel: The stomach is within normal limits. There is no
evidence of bowel obstruction.There is no bowel wall thickening. The
appendix is normal.

Vascular/Lymphatic: No significant vascular findings are present. No
enlarged abdominal or pelvic lymph nodes.

Reproductive: Unremarkable.

Other: No hernia. No free air. Retroperitoneal fluid as described
above.

Musculoskeletal: No acute osseous abnormality. No suspicious lytic
or blastic lesions. Multilevel degenerative changes spine worst at
L4-L5 and L5-S1. Mild bilateral hip arthritis.
IMPRESSION: Right-sided hydroureteronephrosis with perinephric and periureteral
stranding due to a cluster of small obstructing stones at the right
ureterovesicular junction. There is fluid in the right
retroperitoneum tracking from the upper mid abdomen into the pelvis,
likely reactive and related to the right nephroureteral process.
Correlate with urinalysis for signs of infection.

Additional punctate nonobstructive stone in the right mid kidney.

5 mm right lower lobe pulmonary nodule as seen on recent chest CT.
Recommend continued chest CT follow-up as recommended on prior chest
CT in October 2020.

## 2023-07-16 ENCOUNTER — Other Ambulatory Visit: Payer: Self-pay | Admitting: Surgery

## 2023-07-16 DIAGNOSIS — R1011 Right upper quadrant pain: Secondary | ICD-10-CM

## 2023-07-16 NOTE — Progress Notes (Signed)
 Patient reporting post-prandial right upper quadrant abdominal pain.  Recommend evaluation with RUQ US  and evaluation in the office.  Jonathan Olds, MD General, Bariatric and Minimally Invasive Surgery [facility] - [facility]

## 2023-07-22 ENCOUNTER — Ambulatory Visit (HOSPITAL_BASED_OUTPATIENT_CLINIC_OR_DEPARTMENT_OTHER)
Admission: RE | Admit: 2023-07-22 | Discharge: 2023-07-22 | Disposition: A | Discharge status: Home / Self Care | Source: Ambulatory Visit | Attending: Surgery | Admitting: Surgery

## 2023-07-22 DIAGNOSIS — R1011 Right upper quadrant pain: Secondary | ICD-10-CM | POA: Diagnosis not present

## 2023-07-22 DIAGNOSIS — R109 Unspecified abdominal pain: Secondary | ICD-10-CM | POA: Diagnosis not present

## 2023-07-22 DIAGNOSIS — K760 Fatty (change of) liver, not elsewhere classified: Secondary | ICD-10-CM | POA: Diagnosis not present

## 2023-07-25 ENCOUNTER — Ambulatory Visit: Payer: Self-pay

## 2023-07-25 NOTE — Telephone Encounter (Signed)
 FYI Only or Action Required?: FYI only for provider  Patient was last seen in primary care on new to [facility]. Called Nurse Triage reporting Abdominal Pain. Symptoms began 6 weeks ago. Interventions attempted: Nothing. Symptoms are: gradually worsening.  Triage Disposition: See Physician Within 24 Hours  Patient/caregiver understands and will follow disposition?: Yes, will follow disposition  Copied from CRM [id number]. Topic: Clinical - Red Word Triage >> Jul 25, 2023  8:55 AM El Gravely T wrote: [facility] that prompted transfer to Nurse Triage: Abdominal pain, cramping, nausea and vomiting Reason for Disposition  [1] MODERATE pain (e.g., interferes with normal activities) AND [2] comes and goes (cramps) AND [3] present > 24 hours  (Exception: Pain with Vomiting or Diarrhea - see that Guideline.)  Answer Assessment - Initial Assessment Questions 1. LOCATION: "Where does it hurt?"      LLQ 2. RADIATION: "Does the pain shoot anywhere else?" (e.g., chest, back)     Radiate to back 3. ONSET: "When did the pain begin?" (e.g., minutes, hours or days ago)      6 weeks 4. SUDDEN: "Gradual or sudden onset?"     gradual 5. PATTERN "Does the pain come and go, or is it constant?"    - If it comes and goes: "How long does it last?" "Do you have pain now?"     (Note: Comes and goes means the pain is intermittent. It goes away completely between bouts.)    - If constant: "Is it getting better, staying the same, or getting worse?"      (Note: Constant means the pain never goes away completely; most serious pain is constant and gets worse.)      Comes and goes 6. SEVERITY: "How bad is the pain?"  (e.g., Scale 1-10; mild, moderate, or severe)    - MILD (1-3): Doesn't interfere with normal activities, abdomen soft and not tender to touch..     - MODERATE (4-7): Interferes with normal activities or awakens from sleep, abdomen tender to touch.     - SEVERE (8-10): Excruciating pain, doubled over, unable to do any normal  activities.       Average 4-5, can go to 8 7. RECURRENT SYMPTOM: "Have you ever had this type of stomach pain before?" If Yes, ask: "When was the last time?" and "What happened that time?"      Unsure, hx of pancreatitis and kidney stones, states both felt different 8. AGGRAVATING FACTORS: "Does anything seem to cause this pain?" (e.g., foods, stress, alcohol)     Food makes worse 9. CARDIAC SYMPTOMS: "Do you have any of the following symptoms: chest pain, difficulty breathing, sweating, nausea?"     denies 10. OTHER SYMPTOMS: "Do you have any other symptoms?" (e.g., back pain, diarrhea, fever, urination pain, vomiting)       Couple bouts of diarrhea-light in color, 2 episodes of vomiting Denies GU s/s, denies fever, caller is partner to pt and states that he has "classic gallbladder s/s"  Protocols used: Abdominal Pain - Upper-A-AH

## 2023-07-26 ENCOUNTER — Encounter (HOSPITAL_BASED_OUTPATIENT_CLINIC_OR_DEPARTMENT_OTHER): Payer: Self-pay | Admitting: *Deleted

## 2023-07-26 ENCOUNTER — Ambulatory Visit (INDEPENDENT_AMBULATORY_CARE_PROVIDER_SITE_OTHER): Admitting: Family Medicine

## 2023-07-26 ENCOUNTER — Encounter (HOSPITAL_BASED_OUTPATIENT_CLINIC_OR_DEPARTMENT_OTHER): Payer: Self-pay | Admitting: Family Medicine

## 2023-07-26 VITALS — BP 138/88 | HR 64 | Ht 68.0 in | Wt 191.8 lb

## 2023-07-26 DIAGNOSIS — H109 Unspecified conjunctivitis: Secondary | ICD-10-CM | POA: Insufficient documentation

## 2023-07-26 DIAGNOSIS — Z630 Problems in relationship with spouse or partner: Secondary | ICD-10-CM | POA: Insufficient documentation

## 2023-07-26 DIAGNOSIS — E1165 Type 2 diabetes mellitus with hyperglycemia: Secondary | ICD-10-CM | POA: Diagnosis not present

## 2023-07-26 DIAGNOSIS — M62838 Other muscle spasm: Secondary | ICD-10-CM | POA: Insufficient documentation

## 2023-07-26 DIAGNOSIS — Z7189 Other specified counseling: Secondary | ICD-10-CM | POA: Insufficient documentation

## 2023-07-26 DIAGNOSIS — R1011 Right upper quadrant pain: Secondary | ICD-10-CM | POA: Insufficient documentation

## 2023-07-26 DIAGNOSIS — Z794 Long term (current) use of insulin: Secondary | ICD-10-CM

## 2023-07-26 DIAGNOSIS — Z9189 Other specified personal risk factors, not elsewhere classified: Secondary | ICD-10-CM | POA: Diagnosis not present

## 2023-07-26 DIAGNOSIS — S63519A Sprain of carpal joint of unspecified wrist, initial encounter: Secondary | ICD-10-CM | POA: Insufficient documentation

## 2023-07-26 DIAGNOSIS — E7800 Pure hypercholesterolemia, unspecified: Secondary | ICD-10-CM | POA: Insufficient documentation

## 2023-07-26 DIAGNOSIS — F609 Personality disorder, unspecified: Secondary | ICD-10-CM | POA: Insufficient documentation

## 2023-07-26 DIAGNOSIS — Z569 Unspecified problems related to employment: Secondary | ICD-10-CM | POA: Insufficient documentation

## 2023-07-26 DIAGNOSIS — I10 Essential (primary) hypertension: Secondary | ICD-10-CM | POA: Diagnosis not present

## 2023-07-26 MED ORDER — DOXYCYCLINE HYCLATE 100 MG PO TABS: 200.0000 mg | ORAL_TABLET | Freq: Once | ORAL | 0 refills | Status: AC

## 2023-07-26 NOTE — Assessment & Plan Note (Signed)
 Recent tick exposure, uncertain how long in place.  We discussed consideration given timing and we can proceed with prophylaxis against Lyme disease.  Discussed antibiotic used, patient denies any allergies to this antibiotic, has utilized it in the past.  Cautioned on potential side effects, particularly due to photosensitivity

## 2023-07-26 NOTE — Assessment & Plan Note (Signed)
 Uncertain etiology, however history would suggest potential gallbladder disease.  He did have recent ultrasound completed which generally was unremarkable, did show underlying hepatic steatosis which patient is aware of. He has also had prior labs, however these were elevated over 1 month ago.  Labs were normal at that time. We discussed options and we can proceed with referral to general surgeon for further evaluation and likely additional testing.  Discussed precautions related to symptoms and recommendation to return to office or present to emergency department if any notable worsening occurs, particularly if patient develops any fever, jaundice, worsening abdominal pain

## 2023-07-26 NOTE — Patient Instructions (Signed)
  Medication Instructions:  Your physician recommends that you continue on your current medications as directed. Please refer to the Current Medication list given to you today. --If you need a refill on any your medications before your next appointment, please call your pharmacy first. If no refills are authorized on file call the office.--   Referrals/Procedures/Imaging: General Surgery  Follow-Up: Your next appointment:   Your physician recommends that you schedule a follow-up appointment in: 4-6 week follow up  with Dr. de Peru  You will receive a text message or e-mail with a link to a survey about your care and experience with us  today! We would greatly appreciate your feedback!   Thanks for letting us  be apart of your health journey!!  Primary Care and Sports Medicine   Dr. Court Distance Peru   We encourage you to activate your patient portal called "MyChart".  Sign up information is provided on this After Visit Summary.  MyChart is used to connect with patients for Virtual Visits (Telemedicine).  Patients are able to view lab/test results, encounter notes, upcoming appointments, etc.  Non-urgent messages can be sent to your provider as well. To learn more about what you can do with MyChart, please visit --  [email or web address].

## 2023-07-26 NOTE — Assessment & Plan Note (Signed)
 A1c recently suboptimally controlled.  Most recent hemoglobin A1c about 6 weeks ago elevated at 8.6%.  Ultimately, goal for patient would be to have A1c less than 7%.  Given this as well as current medications utilized, would recommend increasing dose of insulin .  Given that he has had notable elevations on fasting readings, can increase insulin  by 4 units to 29 units daily. We discussed potential role for GLP-1 receptor agonist, he does not have any contraindications for this medication.  He would be interested in starting once weekly injectable, however given ongoing GI issues, would recommend having this addressed previously before proceeding with initiating medication, patient in agreement Plan to complete foot exam at future office visit

## 2023-07-26 NOTE — Progress Notes (Signed)
 Symptoms convincing for biliary colic, but ultrasound negative.  Will check HIDA scan.  May also be related to diabetes medications.

## 2023-07-26 NOTE — Progress Notes (Signed)
 New Patient Office Visit  Subjective   Patient ID: Jonathan Melendez, male    DOB: May 06, 1970  Age: 53 y.o. MRN: [id number]  CC:  Chief Complaint  Patient presents with   New Patient (Initial Visit)    New Patient was seeing provider in [place] patient has been having stomach pains for 4 weeks middle comes and goes sharp pain and sugars have been out of control last saw pcp 3 months ago     HPI Jonathan Melendez presents to establish care Last PCP - Dr. Lazoff  Abdominal pain: Has been present for about 4 to 6 weeks.  Did have initial evaluation with PCP little bit over 1 month ago.  Had labs and ultrasound imaging completed which overall were unremarkable.  He was instructed to follow-up with PCP if symptoms were not improving or to present to emergency department if having any notable worsening.  He presents here today and is reporting that symptoms do continue, has had gradual progression in symptoms since onset. Maybe better today. Notes mostly over upper abdomen. Had US  completed - was ordered by girlfriend's doctor, general surgeon. Has tried eating less - pain worse after eating. No issues with liquids. Seems to be worse with high fat foods. No prior similar episodes. Some nausea, no vomiting.  Diabetes: currently taking Semglee  25 units daily, metformin  1000 mg BID.  On review of chart, has not been optimally controlled. Most recent A1c 8.6%. Has been checking fasting blood sugar 180-250 last few months.  HTN: taking amlodipine  and metoprolol  for blood pressure. Took valsartan previously, caused coughing.   HLD: on atorvastatin , no reported side effects or issues noted.  Tick bite: Earlier this week, he did notice a tick on him.  He reports that the tick was notably engorged, he was able to remove the tick.  He is uncertain as to when he may have been exposed to poke, does not know exactly how long it may have been in place but thinks it may have been a while.  Patient is  originally from [place], [place]. Has lived here for about 11 years. Patient works Hydrologist, works from home. He enjoys boating, taking care of his animals.  Outpatient Encounter Medications as of 07/26/2023  Medication Sig   amLODipine  (NORVASC ) 5 MG tablet Take 5 mg by mouth daily.   atorvastatin  (LIPITOR) 20 MG tablet Take 20 mg by mouth daily.   Continuous Glucose Sensor (FREESTYLE LIBRE 2 SENSOR) MISC INJECT 1 SENSOR INTO THE SKIN EVERY 14 DAYS FOR CONTINUOUS GLUCOSE MONITORING   metFORMIN  (GLUCOPHAGE -XR) 500 MG 24 hr tablet Take 1,000 mg by mouth in the morning and at bedtime.   metoprolol  succinate (TOPROL -XL) 50 MG 24 hr tablet Take 50 mg by mouth daily.   Multiple Vitamin (MULTIVITAMIN ADULT PO) Take 1 tablet by mouth daily.   SEMGLEE , YFGN, 100 UNIT/ML Pen SMARTSIG:26 Unit(s) SUB-Q Daily   [DISCONTINUED] amLODipine  (NORVASC ) 5 MG tablet Take 5 mg by mouth daily. (Patient not taking: Reported on 07/26/2023)   [DISCONTINUED] insulin  glargine (LANTUS  SOLOSTAR) 100 UNIT/ML Solostar Pen Inject 20 Units into the skin daily. 10 units at 0530 (Patient not taking: Reported on 07/26/2023)   [DISCONTINUED] nitroGLYCERIN  (NITROSTAT ) 0.4 MG SL tablet Place 1 tablet (0.4 mg total) under the tongue every 5 (five) minutes as needed for chest pain. Max 3 doses in 15 minutes (Patient taking differently: Place 0.4 mg under the tongue every 5 (five) minutes as needed for chest pain. Max 3 doses in 15  minutes Never used before)   [DISCONTINUED] pregabalin  (LYRICA ) 50 MG capsule Take 1 capsule (50 mg total) by mouth 3 (three) times daily.   [DISCONTINUED] tamsulosin  (FLOMAX ) 0.4 MG CAPS capsule Take 1 capsule (0.4 mg total) by mouth daily. Once daily until stone passes (Patient not taking: Reported on 03/01/2022)   No facility-administered encounter medications on file as of 07/26/2023.    Past Medical History:  Diagnosis Date   Ankylosing spondylitis (HCC)    Diabetes mellitus without complication (HCC)     Dyslipidemia    History of kidney stones    Hypertension    Pancreatitis     Past Surgical History:  Procedure Laterality Date   ANTERIOR CERVICAL DECOMP/DISCECTOMY FUSION N/A 03/09/2022   Procedure: C5-6, C6-7 ANTERIOR CERVICAL DECOMPRESSION/DISCECTOMY FUSION 2 LEVELS;  Surgeon: Diedra Fowler, MD;  Location: [facility] OR;  Service: Orthopedics;  Laterality: N/A;   CYSTOSCOPY/URETEROSCOPY/HOLMIUM LASER/STENT PLACEMENT Right 05/25/2021   Procedure: CYSTOSCOPY/URETEROSCOPY/HOLMIUM LASER/STENT PLACEMENT;  Surgeon: Christina Coyer, MD;  Location: [facility];  Service: Urology;  Laterality: Right;   TONSILLECTOMY     VASECTOMY     VASECTOMY REVERSAL      Family History  Problem Relation Age of Onset   Lung cancer Father    Dementia Father    Autoimmune disease Brother        x2   Lung cancer Paternal Grandfather    Dementia Paternal Grandfather     Social History   Socioeconomic History   Marital status: Significant Other    Spouse name: Not on file   Number of children: Not on file   Years of education: Not on file   Highest education level: Not on file  Occupational History   Occupation: Best boy  Tobacco Use   Smoking status: Never    Passive exposure: Never   Smokeless tobacco: Never  Vaping Use   Vaping status: Never Used  Substance and Sexual Activity   Alcohol use: Yes    Comment: occasional   Drug use: Never   Sexual activity: Yes  Other Topics Concern   Not on file  Social History Narrative   Not on file   Social Drivers of Health   Financial Resource Strain: Not on file  Food Insecurity: Low Risk  (06/12/2023)   Received from [facility]   Hunger Vital Sign    Worried About Running Out of Food in the Last Year: Never true    Ran Out of Food in the Last Year: Never true  Transportation Needs: No Transportation Needs (06/12/2023)   Received from [facility]    In the past 12 months, has lack of reliable transportation  kept you from medical appointments, meetings, work or from getting things needed for daily living? : No  Physical Activity: Not on file  Stress: Not on file  Social Connections: Not on file  Intimate Partner Violence: Not At Risk (03/10/2022)   Humiliation, Afraid, Rape, and Kick questionnaire    Fear of Current or Ex-Partner: No    Emotionally Abused: No    Physically Abused: No    Sexually Abused: No    Objective   BP 138/88 (BP Location: Right Arm, Patient Position: Sitting, Cuff Size: Normal)   Pulse 64   Ht 5\' 8"  (1.727 m)   Wt 191 lb 12.8 oz (87 kg)   SpO2 98%   BMI 29.16 kg/m   Physical Exam  53 year old male in no acute distress Cardiovascular exam with regular  rate and rhythm Lungs clear to auscultation bilaterally Abdomen with normal bowel sounds, soft, no tenderness, nondistended, does appear to have small elliptical shaped bulge within the upper central abdomen present when lying back for in the process of sitting up.  Assessment & Plan:   RUQ abdominal pain Assessment & Plan: Uncertain etiology, however history would suggest potential gallbladder disease.  He did have recent ultrasound completed which generally was unremarkable, did show underlying hepatic steatosis which patient is aware of. He has also had prior labs, however these were elevated over 1 month ago.  Labs were normal at that time. We discussed options and we can proceed with referral to general surgeon for further evaluation and likely additional testing.  Discussed precautions related to symptoms and recommendation to return to office or present to emergency department if any notable worsening occurs, particularly if patient develops any fever, jaundice, worsening abdominal pain   Type 2 diabetes mellitus with hyperglycemia, with long-term current use of insulin  (HCC) Assessment & Plan: A1c recently suboptimally controlled.  Most recent hemoglobin A1c about 6 weeks ago elevated at 8.6%.   Ultimately, goal for patient would be to have A1c less than 7%.  Given this as well as current medications utilized, would recommend increasing dose of insulin .  Given that he has had notable elevations on fasting readings, can increase insulin  by 4 units to 29 units daily. We discussed potential role for GLP-1 receptor agonist, he does not have any contraindications for this medication.  He would be interested in starting once weekly injectable, however given ongoing GI issues, would recommend having this addressed previously before proceeding with initiating medication, patient in agreement Plan to complete foot exam at future office visit   Risk of exposure to Lyme disease Assessment & Plan: Recent tick exposure, uncertain how long in place.  We discussed consideration given timing and we can proceed with prophylaxis against Lyme disease.  Discussed antibiotic used, patient denies any allergies to this antibiotic, has utilized it in the past.  Cautioned on potential side effects, particularly due to photosensitivity   Return in about 4 weeks (around 08/23/2023) for diabetes.    ___________________________________________ Haeli Gerlich de [place], MD, ABFM, CAQSM Primary Care and Sports Medicine [facility]

## 2023-07-26 NOTE — Assessment & Plan Note (Signed)
 Blood pressure borderline in office today Can continue with current regimen, no changes made today.  Recommend intermittent monitoring of blood pressure at home, DASH diet

## 2023-08-01 ENCOUNTER — Other Ambulatory Visit (HOSPITAL_COMMUNITY): Payer: Self-pay | Admitting: Surgery

## 2023-08-01 DIAGNOSIS — R1011 Right upper quadrant pain: Secondary | ICD-10-CM

## 2023-08-14 IMAGING — CT CT RENAL STONE PROTOCOL
2 of 4 series · 15 of 46 positions shown, 17 images · non-contrast
Comparison: None Available.

CLINICAL DATA: Flank pain, kidney stone suspected



[Series 2: stone full · axial · 0.79mm/px · z∈[-763,-273]mm · 12 of 108 slices shown, 14 images]
[im 5/108  soft-tissue]
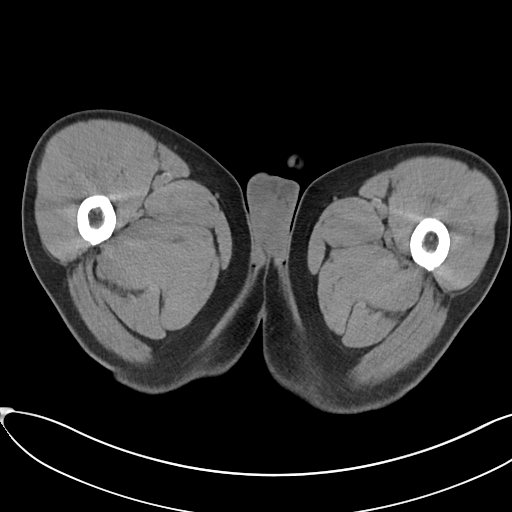
[im 5/108  bone]
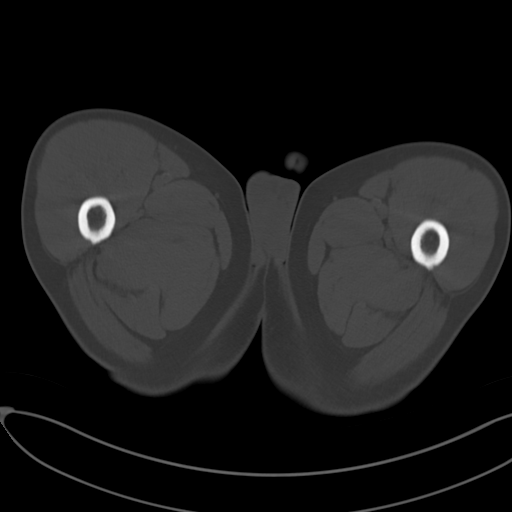
[im 14/108  soft-tissue]
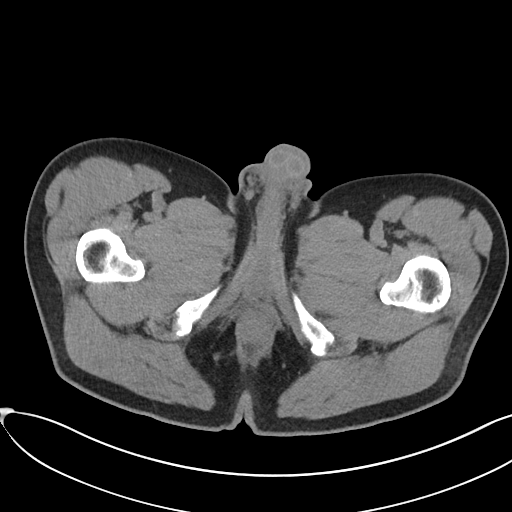
[im 23/108  soft-tissue]
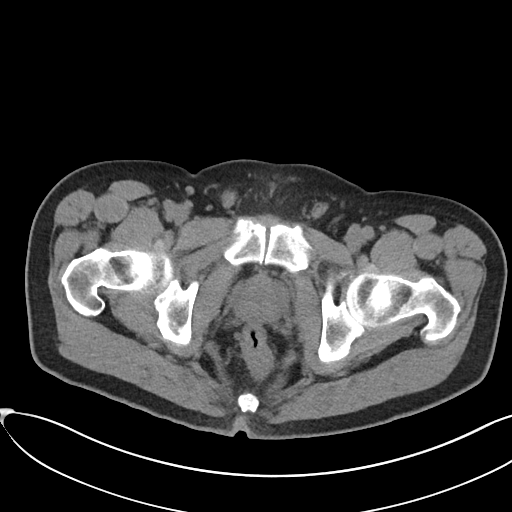
[im 32/108  soft-tissue]
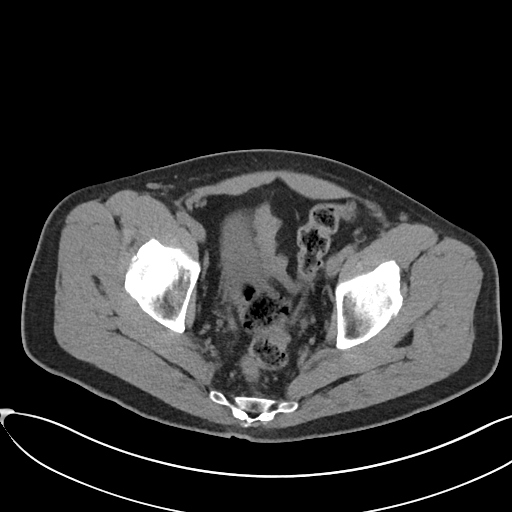
[im 41/108  soft-tissue]
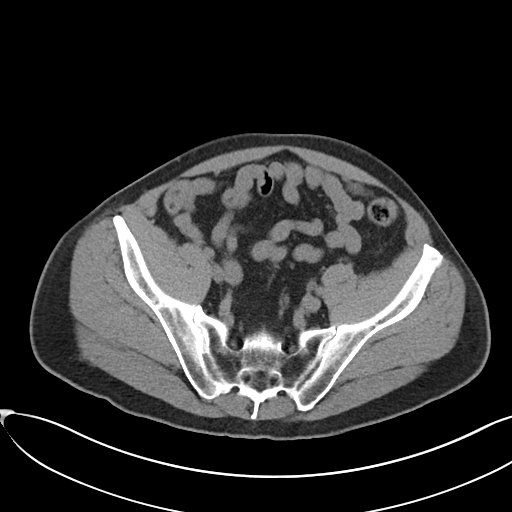
[im 50/108  soft-tissue]
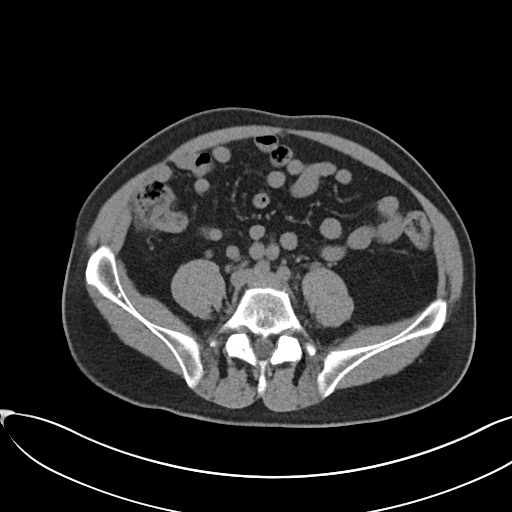
[im 58/108  soft-tissue]
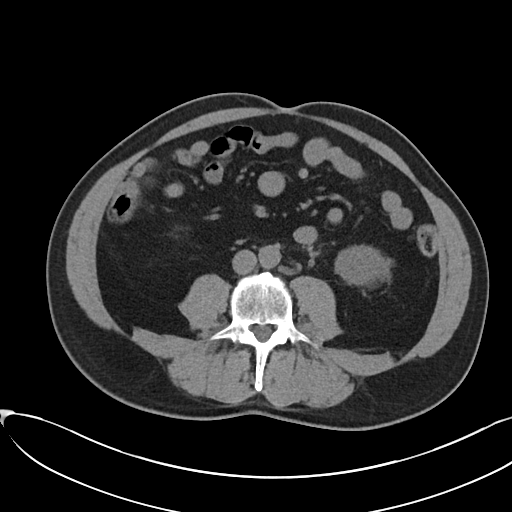
[im 67/108  soft-tissue]
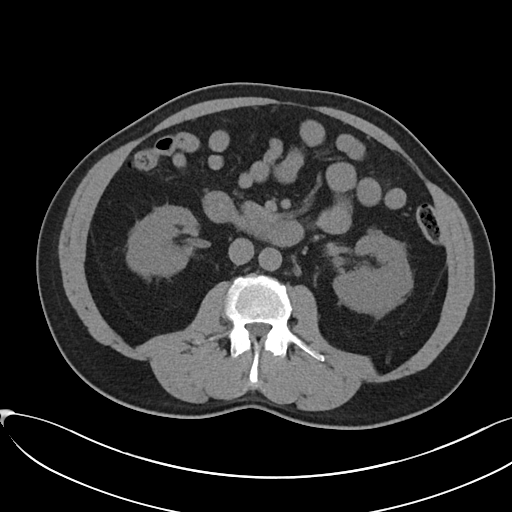
[im 76/108  soft-tissue]
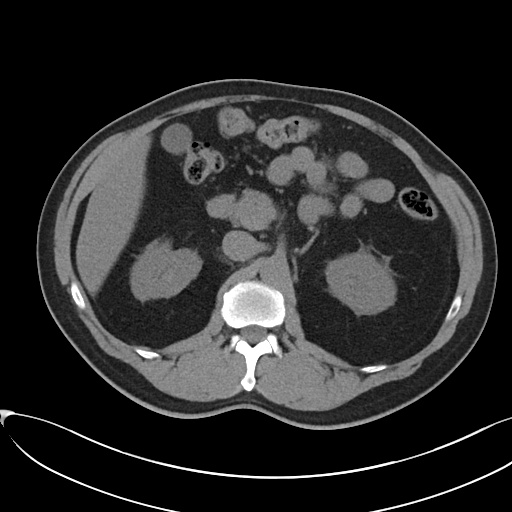
[im 76/108  bone]
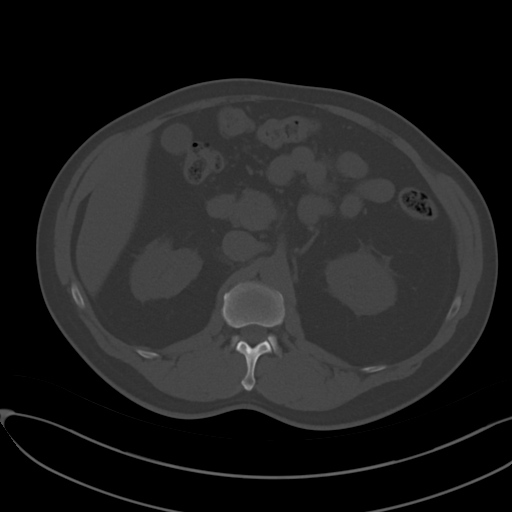
[im 85/108  soft-tissue]
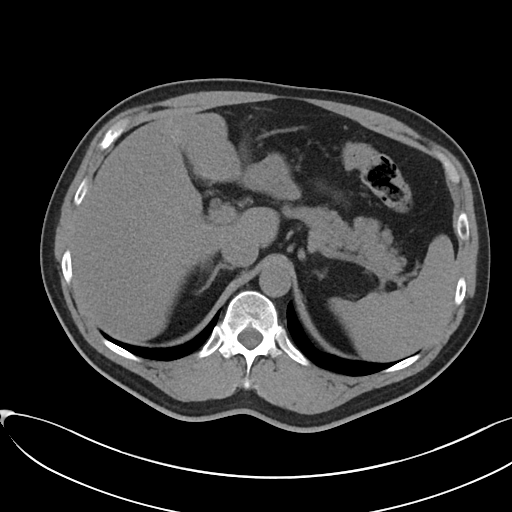
[im 94/108  soft-tissue]
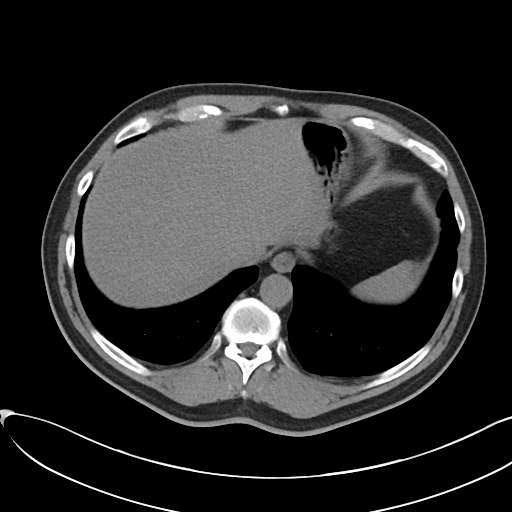
[im 103/108  soft-tissue]
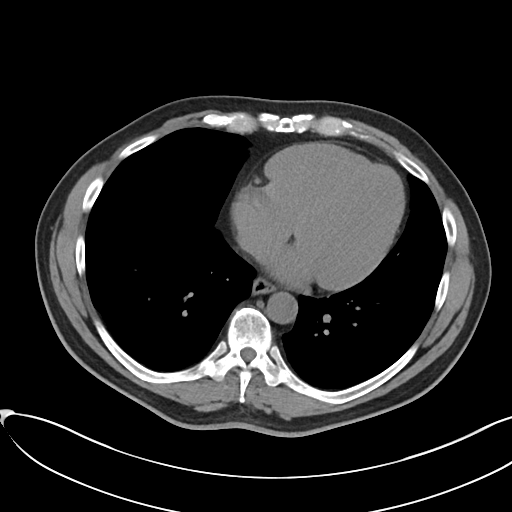

[Series 5: coronal · coronal · 0.76mm/px · 3 of 96 slices shown]
[im 32/96  soft-tissue]
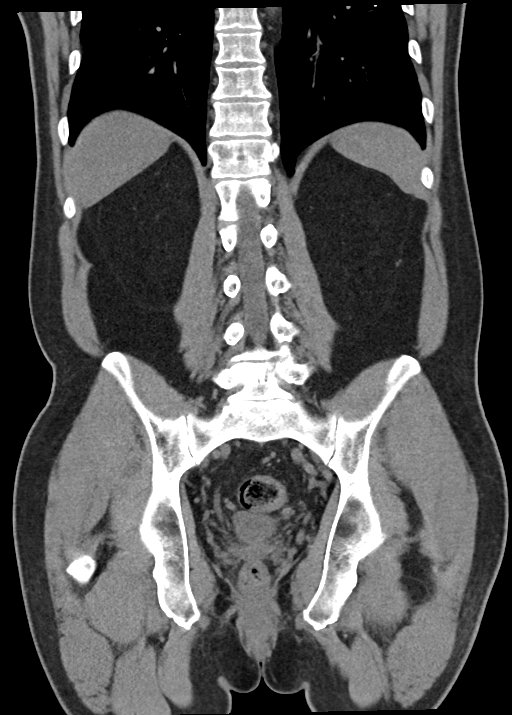
[im 43/96  soft-tissue]
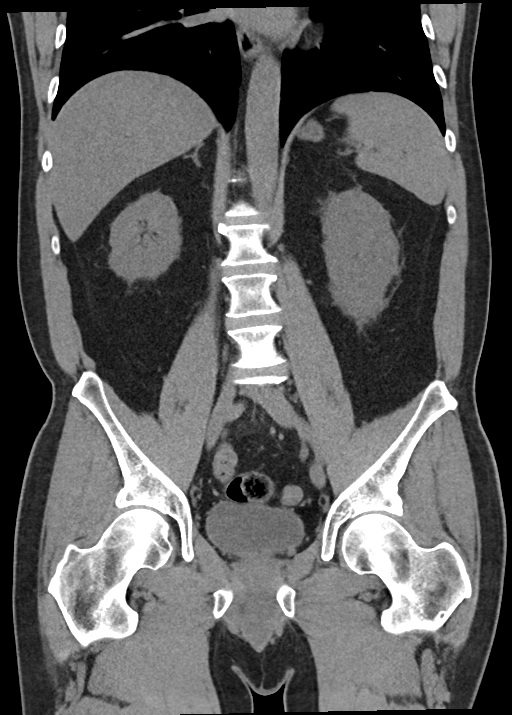
[im 53/96  soft-tissue]
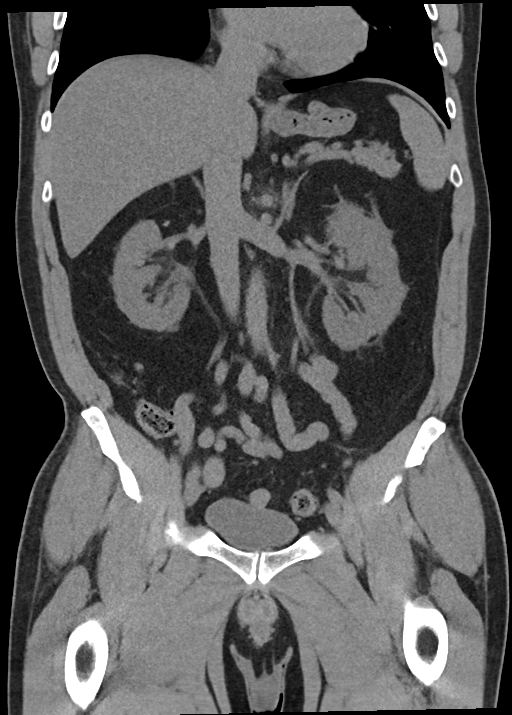

[15 of 46 positions shown; findings below may reference images not displayed]

FINDINGS: Lower chest: No acute abnormality. There is an unchanged, 5 mm right
basilar pulmonary nodule (series 4, image 14), see prior chest CT
11/04/2020 for management recommendations. Prominent left posterior
extrapleural fat.

Hepatobiliary: No focal liver abnormality is seen. The gallbladder
is unremarkable.

Pancreas: Unremarkable. No pancreatic ductal dilatation or
surrounding inflammatory changes.

Spleen: Normal in size without focal abnormality.

Adrenals/Urinary Tract: Adrenal glands are unremarkable. There is a
2 mm stone at the left ureterovesicular junction, with mild
hydroureteronephrosis and periureteral stranding. Resolved
right-sided urinary obstruction. The bladder is mildly distended.

Stomach/Bowel: The stomach is within normal limits. There is no
evidence of bowel obstruction.The appendix is normal.

Vascular/Lymphatic: No significant vascular findings are present. No
enlarged abdominal or pelvic lymph nodes.

Reproductive: Unremarkable.

Other: Resolved right-sided retroperitoneal fluid. No free air. No
ascites. No hernia.

Musculoskeletal: No acute osseous abnormality. No suspicious lytic
or blastic lesions. Multilevel degenerative changes of the spine,
worst at L4-L5 and L5-S1. Mild bilateral hip arthritis.
IMPRESSION: 2 mm stone at the left ureterovesicular junction with mild upstream
hydroureteronephrosis and periureteral stranding.

Resolved right-sided urinary obstruction.

Unchanged 5 mm right basilar pulmonary nodule. Recommend continued
follow-up as documented on prior chest CT 11/04/2020.

## 2023-08-28 ENCOUNTER — Encounter (HOSPITAL_COMMUNITY): Payer: Self-pay

## 2023-08-28 ENCOUNTER — Ambulatory Visit (HOSPITAL_COMMUNITY)
Admission: RE | Admit: 2023-08-28 | Discharge: 2023-08-28 | Disposition: A | Discharge status: Home / Self Care | Source: Ambulatory Visit | Attending: Surgery | Admitting: Surgery

## 2023-08-28 DIAGNOSIS — R109 Unspecified abdominal pain: Secondary | ICD-10-CM | POA: Diagnosis not present

## 2023-08-28 DIAGNOSIS — R1011 Right upper quadrant pain: Secondary | ICD-10-CM | POA: Diagnosis not present

## 2023-08-28 MED ORDER — TECHNETIUM TC 99M MEBROFENIN IV KIT
5.0600 | PACK | Freq: Once | INTRAVENOUS | Status: AC
  Administered 2023-08-28: 5.06 via INTRAVENOUS

## 2023-08-30 ENCOUNTER — Encounter (HOSPITAL_BASED_OUTPATIENT_CLINIC_OR_DEPARTMENT_OTHER): Payer: Self-pay | Admitting: Family Medicine

## 2023-08-30 ENCOUNTER — Ambulatory Visit (INDEPENDENT_AMBULATORY_CARE_PROVIDER_SITE_OTHER): Admitting: Family Medicine

## 2023-08-30 VITALS — BP 133/83 | HR 60 | Ht 68.0 in | Wt 191.3 lb

## 2023-08-30 DIAGNOSIS — Z794 Long term (current) use of insulin: Secondary | ICD-10-CM | POA: Diagnosis not present

## 2023-08-30 DIAGNOSIS — E1165 Type 2 diabetes mellitus with hyperglycemia: Secondary | ICD-10-CM

## 2023-08-30 DIAGNOSIS — I10 Essential (primary) hypertension: Secondary | ICD-10-CM | POA: Diagnosis not present

## 2023-08-30 MED ORDER — INSULIN GLARGINE (1 UNIT DIAL) 300 UNIT/ML ~~LOC~~ SOPN: 29.0000 [IU] | PEN_INJECTOR | Freq: Every day | SUBCUTANEOUS | 1 refills | Status: DC

## 2023-08-30 NOTE — Assessment & Plan Note (Signed)
 Patient continues with metformin  as well as Semglee .  Denies any issues with medication.  Patient did increase dose of insulin  as discussed and has been administering 29 units daily.  Denies any concerns with medication, has been checking fasting blood sugar and reports that it is still been elevated, similar to where it was previously. We discussed considerations today.  He reports that prior to being on Semglee , he was having better control of blood sugars with use of insulin .  Previously was utilizing Lantus , however no longer covered by insurance.  On review of medication options, it appears that Toujeo  may also be covered in regards to insulin  glargine.  We discussed potentially making a switch and he would be interested in this.  We will switch to Toujeo  and continue with same number of units of insulin .  Can consider titrating dose of insulin  from 29 to 31 units if not able to start Toujeo  or if continuing to have elevated fasting blood sugar readings.  Can also focus on lifestyle modifications.  Discussed moderation of carbohydrates.  Discussed incorporating 5 to 10 minutes of walking/activity after meals.  There may also be some correlation with ongoing GI related issues.  He does have follow-up with specialist regarding this next week. If GI issues are addressed and symptoms are improving, could also consider initiating GLP-1 receptor agonist

## 2023-08-30 NOTE — Progress Notes (Signed)
    Procedures performed today:    None.  Independent interpretation of notes and tests performed by another provider:   None.  Brief History, Exam, Impression, and Recommendations:    BP 133/83 (BP Location: Left Arm, Patient Position: Sitting, Cuff Size: Normal)   Pulse 60   Ht 5' 8 (1.727 m)   Wt 191 lb 4.8 oz (86.8 kg)   SpO2 97%   BMI 29.09 kg/m   Type 2 diabetes mellitus with hyperglycemia, with long-term current use of insulin  (HCC) Assessment & Plan: Patient continues with metformin  as well as Semglee .  Denies any issues with medication.  Patient did increase dose of insulin  as discussed and has been administering 29 units daily.  Denies any concerns with medication, has been checking fasting blood sugar and reports that it is still been elevated, similar to where it was previously. We discussed considerations today.  He reports that prior to being on Semglee , he was having better control of blood sugars with use of insulin .  Previously was utilizing Lantus , however no longer covered by insurance.  On review of medication options, it appears that Toujeo  may also be covered in regards to insulin  glargine.  We discussed potentially making a switch and he would be interested in this.  We will switch to Toujeo  and continue with same number of units of insulin .  Can consider titrating dose of insulin  from 29 to 31 units if not able to start Toujeo  or if continuing to have elevated fasting blood sugar readings.  Can also focus on lifestyle modifications.  Discussed moderation of carbohydrates.  Discussed incorporating 5 to 10 minutes of walking/activity after meals.  There may also be some correlation with ongoing GI related issues.  He does have follow-up with specialist regarding this next week. If GI issues are addressed and symptoms are improving, could also consider initiating GLP-1 receptor agonist  Orders: -     Insulin  Glargine (1 Unit Dial ); Inject 29 Units into the skin daily.   Dispense: 6 mL; Refill: 1  Primary hypertension Assessment & Plan: Blood pressure borderline in office today Can continue with current regimen, no changes made today.  Recommend intermittent monitoring of blood pressure at home, DASH diet   Return in about 1 month (around 09/30/2023) for diabetes, hypertension.   ___________________________________________ Jonathan Hensler de [place], MD, ABFM, CAQSM Primary Care and Sports Medicine [facility]

## 2023-08-30 NOTE — Assessment & Plan Note (Signed)
 Blood pressure borderline in office today Can continue with current regimen, no changes made today.  Recommend intermittent monitoring of blood pressure at home, DASH diet

## 2023-08-30 NOTE — Patient Instructions (Signed)
  Medication Instructions:  Your physician recommends that you continue on your current medications as directed. Please refer to the Current Medication list given to you today. --If you need a refill on any your medications before your next appointment, please call your pharmacy first. If no refills are authorized on file call the office.--    Follow-Up: Your next appointment:   Your physician recommends that you schedule a follow-up appointment in: 1 month follow up  with Dr. de [place]  You will receive a text message or e-mail with a link to a survey about your care and experience with [place] today! We would greatly appreciate your feedback!   Thanks for letting us be apart of your health journey!!  Primary Care and Sports Medicine   Dr. Arlina Robes [place]   We encourage you to activate your patient portal called "MyChart".  Sign up information is provided on this After Visit Summary.  MyChart is used to connect with patients for Virtual Visits (Telemedicine).  Patients are able to view lab/test results, encounter notes, upcoming appointments, etc.  Non-urgent messages can be sent to your provider as well. To learn more about what you can do with MyChart, please visit --  [email or web address].

## 2023-09-02 DIAGNOSIS — R1011 Right upper quadrant pain: Secondary | ICD-10-CM | POA: Diagnosis not present

## 2023-09-02 DIAGNOSIS — R110 Nausea: Secondary | ICD-10-CM | POA: Diagnosis not present

## 2023-09-03 ENCOUNTER — Ambulatory Visit (HOSPITAL_COMMUNITY)
Admission: RE | Admit: 2023-09-03 | Discharge: 2023-09-03 | Disposition: A | Discharge status: Home / Self Care | Source: Ambulatory Visit | Attending: Surgery | Admitting: Surgery

## 2023-09-03 ENCOUNTER — Other Ambulatory Visit: Payer: Self-pay

## 2023-09-03 ENCOUNTER — Encounter (HOSPITAL_COMMUNITY): Payer: Self-pay | Admitting: Surgery

## 2023-09-03 ENCOUNTER — Encounter (HOSPITAL_COMMUNITY)
Admission: RE | Disposition: A | Discharge status: Home / Self Care | Payer: Self-pay | Source: Ambulatory Visit | Attending: Surgery

## 2023-09-03 ENCOUNTER — Inpatient Hospital Stay (HOSPITAL_COMMUNITY): Admitting: Anesthesiology

## 2023-09-03 DIAGNOSIS — I10 Essential (primary) hypertension: Secondary | ICD-10-CM | POA: Diagnosis not present

## 2023-09-03 DIAGNOSIS — E1165 Type 2 diabetes mellitus with hyperglycemia: Secondary | ICD-10-CM | POA: Diagnosis not present

## 2023-09-03 DIAGNOSIS — Z794 Long term (current) use of insulin: Secondary | ICD-10-CM | POA: Diagnosis not present

## 2023-09-03 DIAGNOSIS — E119 Type 2 diabetes mellitus without complications: Secondary | ICD-10-CM | POA: Diagnosis not present

## 2023-09-03 DIAGNOSIS — K811 Chronic cholecystitis: Secondary | ICD-10-CM | POA: Insufficient documentation

## 2023-09-03 DIAGNOSIS — Z7984 Long term (current) use of oral hypoglycemic drugs: Secondary | ICD-10-CM | POA: Diagnosis not present

## 2023-09-03 DIAGNOSIS — Z79899 Other long term (current) drug therapy: Secondary | ICD-10-CM | POA: Insufficient documentation

## 2023-09-03 DIAGNOSIS — R1011 Right upper quadrant pain: Secondary | ICD-10-CM | POA: Diagnosis not present

## 2023-09-03 DIAGNOSIS — K819 Cholecystitis, unspecified: Secondary | ICD-10-CM | POA: Diagnosis present

## 2023-09-03 HISTORY — PX: CHOLECYSTECTOMY: SHX55

## 2023-09-03 LAB — GLUCOSE, CAPILLARY
Glucose-Capillary: 174 mg/dL — ABNORMAL HIGH (ref 70–99)
Glucose-Capillary: 124 mg/dL — ABNORMAL HIGH (ref 70–99)
Glucose-Capillary: 142 mg/dL — ABNORMAL HIGH (ref 70–99)

## 2023-09-03 SURGERY — LAPAROSCOPIC CHOLECYSTECTOMY WITH INTRAOPERATIVE CHOLANGIOGRAM
Anesthesia: General

## 2023-09-03 MED ORDER — ENOXAPARIN SODIUM 40 MG/0.4ML IJ SOSY
40.0000 mg | PREFILLED_SYRINGE | Freq: Once | INTRAMUSCULAR | Status: AC
  Administered 2023-09-03: 40 mg via SUBCUTANEOUS
  Filled 2023-09-03: qty 0.4

## 2023-09-03 MED ORDER — FENTANYL CITRATE (PF) 250 MCG/5ML IJ SOLN
INTRAMUSCULAR | Status: AC
  Filled 2023-09-03: qty 5

## 2023-09-03 MED ORDER — ACETAMINOPHEN 10 MG/ML IV SOLN: 1000.0000 mg | Freq: Once | INTRAVENOUS | Status: DC

## 2023-09-03 MED ORDER — DEXAMETHASONE SODIUM PHOSPHATE 10 MG/ML IJ SOLN
INTRAMUSCULAR | Status: AC
  Filled 2023-09-03: qty 1

## 2023-09-03 MED ORDER — OXYCODONE HCL 5 MG PO TABS
ORAL_TABLET | ORAL | Status: AC
  Filled 2023-09-03: qty 1

## 2023-09-03 MED ORDER — OXYCODONE HCL 5 MG/5ML PO SOLN: 5.0000 mg | Freq: Once | ORAL | Status: AC | PRN

## 2023-09-03 MED ORDER — ACETAMINOPHEN 500 MG PO TABS
1000.0000 mg | ORAL_TABLET | ORAL | Status: AC
  Administered 2023-09-03: 1000 mg via ORAL
  Filled 2023-09-03: qty 2

## 2023-09-03 MED ORDER — KETOROLAC TROMETHAMINE 30 MG/ML IJ SOLN
INTRAMUSCULAR | Status: AC
  Filled 2023-09-03: qty 1

## 2023-09-03 MED ORDER — GLYCOPYRROLATE 0.2 MG/ML IJ SOLN
INTRAMUSCULAR | Status: DC | PRN
  Administered 2023-09-03: .1 mg via INTRAVENOUS

## 2023-09-03 MED ORDER — CHLORHEXIDINE GLUCONATE CLOTH 2 % EX PADS: 6.0000 | MEDICATED_PAD | Freq: Once | CUTANEOUS | Status: DC

## 2023-09-03 MED ORDER — EPHEDRINE SULFATE-NACL 50-0.9 MG/10ML-% IV SOSY
PREFILLED_SYRINGE | INTRAVENOUS | Status: DC | PRN
  Administered 2023-09-03 (×2): 5 mg via INTRAVENOUS

## 2023-09-03 MED ORDER — PROPOFOL 10 MG/ML IV BOLUS
INTRAVENOUS | Status: DC | PRN
  Administered 2023-09-03: 150 mg via INTRAVENOUS

## 2023-09-03 MED ORDER — MIDAZOLAM HCL 5 MG/5ML IJ SOLN
INTRAMUSCULAR | Status: DC | PRN
  Administered 2023-09-03: 2 mg via INTRAVENOUS

## 2023-09-03 MED ORDER — ONDANSETRON HCL 4 MG/2ML IJ SOLN
INTRAMUSCULAR | Status: DC | PRN
  Administered 2023-09-03: 4 mg via INTRAVENOUS

## 2023-09-03 MED ORDER — FENTANYL CITRATE (PF) 100 MCG/2ML IJ SOLN
INTRAMUSCULAR | Status: DC | PRN
  Administered 2023-09-03: 50 ug via INTRAVENOUS
  Administered 2023-09-03: 100 ug via INTRAVENOUS
  Administered 2023-09-03 (×2): 50 ug via INTRAVENOUS

## 2023-09-03 MED ORDER — EPHEDRINE 5 MG/ML INJ
INTRAVENOUS | Status: AC
  Filled 2023-09-03: qty 5

## 2023-09-03 MED ORDER — OXYCODONE HCL 5 MG PO TABS
5.0000 mg | ORAL_TABLET | Freq: Once | ORAL | Status: AC | PRN
  Administered 2023-09-03: 5 mg via ORAL

## 2023-09-03 MED ORDER — FENTANYL CITRATE PF 50 MCG/ML IJ SOSY
PREFILLED_SYRINGE | INTRAMUSCULAR | Status: AC
  Filled 2023-09-03: qty 1

## 2023-09-03 MED ORDER — KETOROLAC TROMETHAMINE 30 MG/ML IJ SOLN
30.0000 mg | Freq: Once | INTRAMUSCULAR | Status: AC
  Administered 2023-09-03: 30 mg via INTRAVENOUS

## 2023-09-03 MED ORDER — FENTANYL CITRATE PF 50 MCG/ML IJ SOSY
25.0000 ug | PREFILLED_SYRINGE | INTRAMUSCULAR | Status: DC | PRN
  Administered 2023-09-03 (×3): 50 ug via INTRAVENOUS

## 2023-09-03 MED ORDER — ROCURONIUM BROMIDE 10 MG/ML (PF) SYRINGE
PREFILLED_SYRINGE | INTRAVENOUS | Status: DC | PRN
  Administered 2023-09-03: 10 mg via INTRAVENOUS
  Administered 2023-09-03: 50 mg via INTRAVENOUS

## 2023-09-03 MED ORDER — GLYCOPYRROLATE 0.2 MG/ML IJ SOLN
INTRAMUSCULAR | Status: AC
  Filled 2023-09-03: qty 1

## 2023-09-03 MED ORDER — CEFAZOLIN SODIUM-DEXTROSE 2-4 GM/100ML-% IV SOLN
2.0000 g | INTRAVENOUS | Status: AC
  Administered 2023-09-03: 2 g via INTRAVENOUS
  Filled 2023-09-03: qty 100

## 2023-09-03 MED ORDER — PROPOFOL 10 MG/ML IV BOLUS
INTRAVENOUS | Status: AC
  Filled 2023-09-03: qty 20

## 2023-09-03 MED ORDER — LIDOCAINE HCL (PF) 2 % IJ SOLN
INTRAMUSCULAR | Status: DC | PRN
  Administered 2023-09-03: 100 mg via INTRADERMAL

## 2023-09-03 MED ORDER — DEXAMETHASONE SODIUM PHOSPHATE 10 MG/ML IJ SOLN
INTRAMUSCULAR | Status: DC | PRN
  Administered 2023-09-03: 5 mg via INTRAVENOUS

## 2023-09-03 MED ORDER — MIDAZOLAM HCL 2 MG/2ML IJ SOLN
INTRAMUSCULAR | Status: AC
  Filled 2023-09-03: qty 2

## 2023-09-03 MED ORDER — BUPIVACAINE-EPINEPHRINE (PF) 0.25% -1:200000 IJ SOLN
INTRAMUSCULAR | Status: AC
  Filled 2023-09-03: qty 30

## 2023-09-03 MED ORDER — INSULIN ASPART 100 UNIT/ML IJ SOLN
0.0000 [IU] | INTRAMUSCULAR | Status: DC | PRN
  Administered 2023-09-03: 2 [IU] via SUBCUTANEOUS
  Filled 2023-09-03: qty 1

## 2023-09-03 MED ORDER — ONDANSETRON HCL 4 MG/2ML IJ SOLN
INTRAMUSCULAR | Status: AC
  Filled 2023-09-03: qty 2

## 2023-09-03 MED ORDER — ROCURONIUM BROMIDE 10 MG/ML (PF) SYRINGE
PREFILLED_SYRINGE | INTRAVENOUS | Status: AC
  Filled 2023-09-03: qty 10

## 2023-09-03 MED ORDER — GABAPENTIN 300 MG PO CAPS
300.0000 mg | ORAL_CAPSULE | ORAL | Status: AC
  Administered 2023-09-03: 300 mg via ORAL
  Filled 2023-09-03: qty 1

## 2023-09-03 MED ORDER — SUGAMMADEX SODIUM 200 MG/2ML IV SOLN
INTRAVENOUS | Status: DC | PRN
  Administered 2023-09-03: 200 mg via INTRAVENOUS

## 2023-09-03 MED ORDER — LACTATED RINGERS IR SOLN
Status: DC | PRN
  Administered 2023-09-03: 1000 mL

## 2023-09-03 MED ORDER — ONDANSETRON HCL 4 MG/2ML IJ SOLN: 4.0000 mg | Freq: Once | INTRAMUSCULAR | Status: DC | PRN

## 2023-09-03 MED ORDER — BUPIVACAINE-EPINEPHRINE 0.25% -1:200000 IJ SOLN
INTRAMUSCULAR | Status: DC | PRN
  Administered 2023-09-03: 30 mL

## 2023-09-03 MED ORDER — LACTATED RINGERS IV SOLN: INTRAVENOUS | Status: DC | PRN

## 2023-09-03 MED ORDER — LIDOCAINE HCL (PF) 2 % IJ SOLN
INTRAMUSCULAR | Status: AC
  Filled 2023-09-03: qty 5

## 2023-09-03 MED ORDER — OXYCODONE HCL 5 MG PO TABS: 5.0000 mg | ORAL_TABLET | Freq: Four times a day (QID) | ORAL | 0 refills | Status: DC | PRN

## 2023-09-03 MED ORDER — FENTANYL CITRATE PF 50 MCG/ML IJ SOSY
PREFILLED_SYRINGE | INTRAMUSCULAR | Status: AC
  Filled 2023-09-03: qty 2

## 2023-09-03 SURGICAL SUPPLY — 33 items
BAG COUNTER SPONGE SURGICOUNT (BAG) IMPLANT
CABLE HIGH FREQUENCY MONO STRZ (ELECTRODE) ×1 IMPLANT
CATH URETL OPEN 5X70 (CATHETERS) IMPLANT
CHLORAPREP W/TINT 26 (MISCELLANEOUS) ×1 IMPLANT
CLIP APPLIE ROT 10 11.4 M/L (STAPLE) ×1 IMPLANT
COVER MAYO STAND XLG (MISCELLANEOUS) ×1 IMPLANT
COVER SURGICAL LIGHT HANDLE (MISCELLANEOUS) ×1 IMPLANT
DERMABOND ADVANCED .7 DNX12 (GAUZE/BANDAGES/DRESSINGS) ×1 IMPLANT
DRAPE C-ARM 42X120 X-RAY (DRAPES) IMPLANT
ELECT REM PT RETURN 15FT ADLT (MISCELLANEOUS) ×1 IMPLANT
ENDOLOOP SUT PDS II 0 18 (SUTURE) ×1 IMPLANT
GLOVE BIO SURGEON STRL SZ7.5 (GLOVE) ×1 IMPLANT
GLOVE INDICATOR 8.0 STRL GRN (GLOVE) ×1 IMPLANT
GOWN STRL REUS W/ TWL XL LVL3 (GOWN DISPOSABLE) ×1 IMPLANT
GRASPER SUT TROCAR 14GX15 (MISCELLANEOUS) IMPLANT
HEMOSTAT SNOW SURGICEL 2X4 (HEMOSTASIS) IMPLANT
IRRIGATION SUCT STRKRFLW 2 WTP (MISCELLANEOUS) ×1 IMPLANT
IV CATH 14GX2 1/4 (CATHETERS) ×1 IMPLANT
KIT BASIN OR (CUSTOM PROCEDURE TRAY) ×1 IMPLANT
KIT TURNOVER KIT A (KITS) ×1 IMPLANT
NDL INSUFFLATION 14GA 120MM (NEEDLE) ×1 IMPLANT
NEEDLE INSUFFLATION 14GA 120MM (NEEDLE) ×1 IMPLANT
POUCH RETRIEVAL ECOSAC 10 (ENDOMECHANICALS) ×1 IMPLANT
SCISSORS LAP 5X35 DISP (ENDOMECHANICALS) ×1 IMPLANT
SET TUBE SMOKE EVAC HIGH FLOW (TUBING) ×1 IMPLANT
SLEEVE Z-THREAD 5X100MM (TROCAR) ×2 IMPLANT
SPIKE FLUID TRANSFER (MISCELLANEOUS) ×1 IMPLANT
STOPCOCK 4 WAY LG BORE MALE ST (IV SETS) IMPLANT
SUT MNCRL AB 4-0 PS2 18 (SUTURE) ×1 IMPLANT
TOWEL OR 17X26 10 PK STRL BLUE (TOWEL DISPOSABLE) ×1 IMPLANT
TRAY LAPAROSCOPIC (CUSTOM PROCEDURE TRAY) ×1 IMPLANT
TROCAR ADV FIXATION 12X100MM (TROCAR) ×1 IMPLANT
TROCAR Z-THREAD OPTICAL 5X100M (TROCAR) ×1 IMPLANT

## 2023-09-03 NOTE — Transfer of Care (Signed)
 Immediate Anesthesia Transfer of Care Note  Patient: Jonathan Melendez  Procedure(s) Performed: LAPAROSCOPIC CHOLECYSTECTOMY WITH INTRAOPERATIVE CHOLANGIOGRAM  Patient Location: PACU  Anesthesia Type:General  Level of Consciousness: awake and alert   Airway & Oxygen Therapy: Patient Spontanous Breathing and Patient connected to face mask oxygen  Post-op Assessment: Report given to RN and Post -op Vital signs reviewed and stable  Post vital signs: Reviewed and stable  Last Vitals:  Vitals Value Taken Time  BP 176/98 09/03/23 13:53  Temp    Pulse 95 09/03/23 13:57  Resp 16 09/03/23 13:57  SpO2 100 % 09/03/23 13:57  Vitals shown include unfiled device data.  Last Pain:  Vitals:   09/03/23 0954  TempSrc: Oral         Complications: No notable events documented.

## 2023-09-03 NOTE — H&P (Signed)
 Admitting Physician: Deward PARAS Mishell Donalson  Service: General surgery  CC: Abdominal pain  Subjective   HPI: Jonathan Melendez is an 53 y.o. male who is here for abdominal pain.  He has had pain off and on for a number of months.  Worse after certain foods.  He has had pain now for about a week.  Right upper quadrant area.  Some nausea, no appetite.  He is here for cholecystectomy  Past Medical History:  Diagnosis Date   Ankylosing spondylitis (HCC)    Diabetes mellitus without complication (HCC)    Dyslipidemia    History of kidney stones    Hypertension    Pancreatitis     Past Surgical History:  Procedure Laterality Date   ANTERIOR CERVICAL DECOMP/DISCECTOMY FUSION N/A 03/09/2022   Procedure: C5-6, C6-7 ANTERIOR CERVICAL DECOMPRESSION/DISCECTOMY FUSION 2 LEVELS;  Surgeon: Georgina Ozell LABOR, MD;  Location: [facility];  Service: Orthopedics;  Laterality: N/A;   CYSTOSCOPY/URETEROSCOPY/HOLMIUM LASER/STENT PLACEMENT Right 05/25/2021   Procedure: CYSTOSCOPY/URETEROSCOPY/HOLMIUM LASER/STENT PLACEMENT;  Surgeon: Nieves Cough, MD;  Location: [facility];  Service: Urology;  Laterality: Right;   TONSILLECTOMY     VASECTOMY     VASECTOMY REVERSAL      Family History  Problem Relation Age of Onset   Lung cancer Father    Dementia Father    Autoimmune disease Brother        x2   Lung cancer Paternal Grandfather    Dementia Paternal Grandfather     Social:  reports that he has never smoked. He has never been exposed to tobacco smoke. He has never used smokeless tobacco. He reports current alcohol use. He reports that he does not use drugs.  Allergies:  Allergies  Allergen Reactions   Valsartan Cough    Medications: Current Outpatient Medications  Medication Instructions   amLODipine  (NORVASC ) 5 mg, Daily   atorvastatin  (LIPITOR) 20 mg, Daily   Continuous Glucose Sensor (FREESTYLE LIBRE 2 SENSOR) MISC INJECT 1 SENSOR INTO THE SKIN EVERY 14 DAYS FOR CONTINUOUS GLUCOSE MONITORING    insulin  glargine (1 Unit Dial ) (TOUJEO ) 29 Units, Subcutaneous, Daily   metFORMIN  (GLUCOPHAGE -XR) 1,000 mg, 2 times daily   metoprolol  succinate (TOPROL -XL) 50 mg, Daily   Multiple Vitamin (MULTIVITAMIN ADULT PO) 1 tablet, Daily   SEMGLEE , YFGN, 100 UNIT/ML Pen SMARTSIG:26 Unit(s) SUB-Q Daily    ROS - all of the below systems have been reviewed with the patient and positives are indicated with bold text General: chills, fever or night sweats Eyes: blurry vision or double vision ENT: epistaxis or sore throat Allergy/Immunology: itchy/watery eyes or nasal congestion Hematologic/Lymphatic: bleeding problems, blood clots or swollen lymph nodes Endocrine: temperature intolerance or unexpected weight changes Breast: new or changing breast lumps or nipple discharge Resp: cough, shortness of breath, or wheezing CV: chest pain or dyspnea on exertion GI: as per HPI GU: dysuria, trouble voiding, or hematuria MSK: joint pain or joint stiffness Neuro: TIA or stroke symptoms Derm: pruritus and skin lesion changes Psych: anxiety and depression  Objective   PE Blood pressure (!) 143/85, pulse 65, temperature 98.4 F (36.9 C), temperature source Oral, resp. rate 16, height 5' 8 (1.727 m), weight 86.8 kg, SpO2 97%. Constitutional: NAD; conversant; no deformities Eyes: Moist conjunctiva; no lid lag; anicteric; PERRL Neck: Trachea midline; no thyromegaly Lungs: Normal respiratory effort; no tactile fremitus CV: RRR; no palpable thrills; no pitting edema GI: Abd Soft, tender RUQ; no palpable hepatosplenomegaly MSK: Normal range of motion of extremities; no clubbing/cyanosis Psychiatric: Appropriate  affect; alert and oriented x3 Lymphatic: No palpable cervical or axillary lymphadenopathy  Results for orders placed or performed during the hospital encounter of 09/03/23 (from the past 24 hours)  Glucose, capillary     Status: Abnormal   Collection Time: 09/03/23 10:00 AM  Result Value Ref Range    Glucose-Capillary 174 (H) 70 - 99 mg/dL   Comment 1 Notify RN    Comment 2 Document in Chart   Glucose, capillary     Status: Abnormal   Collection Time: 09/03/23 12:24 PM  Result Value Ref Range   Glucose-Capillary 124 (H) 70 - 99 mg/dL    Imaging Orders  No imaging studies ordered today   HIDA 08/28/23  IMPRESSION: 1.  Patent cystic and common bile ducts.   2. Elevated gallbladder ejection fraction as can be seen with chronic cholecystitis/biliary dyskinesia.  RUQ US  07/22/23 No gallstones or wall thickening visualized. No sonographic Murphy sign noted by sonographer. IMPRESSION: Hepatic steatosis.  CT Abd/Pel 05/24/21  IMPRESSION: Right-sided hydroureteronephrosis with perinephric and periureteral stranding due to a cluster of small obstructing stones at the right ureterovesicular junction. There is fluid in the right retroperitoneum tracking from the upper mid abdomen into the pelvis, likely reactive and related to the right nephroureteral process. Correlate with urinalysis for signs of infection.   Additional punctate nonobstructive stone in the right mid kidney.   5 mm right lower lobe pulmonary nodule as seen on recent chest CT. Recommend continued chest CT follow-up as recommended on prior chest CT in September 2022.     Assessment and Plan   Jonathan Melendez is an 53 y.o. male with abdominal pain consistent with billiary colic, now unrelenting for about a week, a HIDA scan positive for gallbladder hyperkinesia.  I recommended laparoscopic cholecystectomy.  We discussed the procedure, its risks, benefits and alternatives and the patient granted consent to proceed.  We will proceed as scheduled.   Deward JINNY Foy, MD  [facility] Use [email or web address] to contact on call provider  New Patient Billing: [phone] - High MDM

## 2023-09-03 NOTE — Op Note (Signed)
 Patient: Jonathan Melendez (08/18/1970, [id number])  Date of Surgery: 09/03/2023  Preoperative Diagnosis: CHOLECYSTITIS   Postoperative Diagnosis: CHOLECYSTITIS   Surgical Procedure: Laparoscopic cholecystectomy   Operative Team Members:  Surgeons and Role:    * Jonathan Melendez, Jonathan PARAS, MD - Primary   Anesthesiologist: Jonathan Fess, MD CRNA: Jonathan Show, CRNA; Jonathan Melendez PARAS, CRNA   Anesthesia: General   Fluids:  Total I/O In: 100 [IV Piggyback:100] Out: -   Complications: None  Drains:  none   Specimen:  ID Type Source Tests Collected by Time Destination  1 : Gallbladder Tissue PATH Gallbladder SURGICAL PATHOLOGY Jonathan Melendez, Jonathan PARAS, MD 09/03/2023 1325      Disposition:  PACU - hemodynamically stable.  Plan of Care: Discharge to home after PACU    Indications for Procedure: Jonathan Melendez is a 53 y.o. male who presented with abdominal pain.  History, physical and imaging was concerning for cholecystitis.  Laparoscopic cholecystectomy was recommended for the patient.  The procedure itself, as well as the risks, benefits and alternatives were discussed with the patient.  Risks discussed included but were not limited to the risk of infection, bleeding, damage to nearby structures, need to convert to open procedure, incisional hernia, bile leak, common bile duct injury and the need for additional procedures or surgeries.  With this discussion complete and all questions answered the patient granted consent to proceed.  Findings: Some inflammation near the cystic duct  Infection status: Patient: Jonathan Melendez Emergency General Surgery Service Patient Case: Urgent Infection Present At Time Of Surgery (PATOS): None   Description of Procedure:   On the date stated above, the patient was taken to the operating room suite and placed in supine positioning.  Sequential compression devices were placed on the lower extremities to prevent blood clots.  General endotracheal  anesthesia was induced. Preoperative antibiotics were given.  The patient's abdomen was prepped and draped in the usual sterile fashion.  A time-out was completed verifying the correct patient, procedure, positioning and equipment needed for the case.  We began by anesthetizing the skin with local anesthetic and then making a 5 mm incision just below the umbilicus.  We dissected through the subcutaneous tissues to the fascia.  The fascia was grasped and elevated using a Kocher clamp.  A Veress needle was inserted into the abdomen and the abdomen was insufflated to 15 mmHg.  A 5 mm trocar was inserted in this position under optical guidance and then the abdomen was inspected.  There was no trauma to the underlying viscera with initial trocar placement.  Any abnormal findings, other than inflammation in the right upper quadrant, are listed above in the findings section.  Three additional trocars were placed, one 12 mm trocar in the subxiphoid position, one 5 mm trocar in the midline epigastric area and one 5mm trocar in the right upper quadrant subcostally.  These were placed under direct vision without any trauma to the underlying viscera.    The patient was then placed in head up, left side down positioning.  The gallbladder was identified and dissected free from its attachments to the omentum allowing the duodenum to fall away.  The infundibulum of the gallbladder was dissected free working laterally to medially.  The cystic duct and cystic artery were dissected free from surrounding connective tissue.  The infundibulum of the gallbladder was dissected off the cystic plate.  A critical view of safety was obtained with the cystic duct and cystic artery being cleared of connective tissues and clearly  the only two structures entering into the gallbladder with the liver clearly visible behind.  Clips were then applied to the cystic duct and cystic artery and then these structures were divided.  A PDS endoloop was  placed on the cystic duct stump. The gallbladder was dissected off the cystic plate, placed in an endocatch bag and removed from the 12 mm subxiphoid port site.  The clips were inspected and appeared effective.  The cystic plate was inspected and hemostasis was obtained using electrocautery.  The operative field was dry on final inspection.  Attention was turned to closure.  The 12 mm subxiphoid port site was closed using a 0-vicryl suture on a fascial suture passer.  The abdomen was desufflated.  The skin was closed using 4-0 monocryl and dermabond.  All sponge and needle counts were correct at the conclusion of the case.    Jonathan Foy, MD General, Bariatric, & Minimally Invasive Surgery [facility], [place]

## 2023-09-03 NOTE — Anesthesia Preprocedure Evaluation (Signed)
 Anesthesia Evaluation  Patient identified by MRN, date of birth, ID band Patient awake    Reviewed: Allergy & Precautions, NPO status , Patient's Chart, lab work & pertinent test results  History of Anesthesia Complications Negative for: history of anesthetic complications  Airway Mallampati: II  TM Distance: >3 FB Neck ROM: Full    Dental no notable dental hx. (+) Teeth Intact   Pulmonary neg pulmonary ROS, neg sleep apnea, neg COPD, Patient abstained from smoking.Not current smoker   Pulmonary exam normal breath sounds clear to auscultation       Cardiovascular Exercise Tolerance: Good METShypertension, Pt. on medications (-) CAD and (-) Past MI (-) dysrhythmias  Rhythm:Regular Rate:Normal - Systolic murmurs    Neuro/Psych negative neurological ROS  negative psych ROS   GI/Hepatic ,neg GERD  ,,(+)     (-) substance abuse    Endo/Other  diabetes, Poorly Controlled, Insulin  Dependent    Renal/GU negative Renal ROS     Musculoskeletal   Abdominal   Peds  Hematology   Anesthesia Other Findings Past Medical History: No date: Ankylosing spondylitis (HCC) No date: Diabetes mellitus without complication (HCC) No date: Dyslipidemia No date: History of kidney stones No date: Hypertension No date: Pancreatitis  Reproductive/Obstetrics                              Anesthesia Physical Anesthesia Plan  ASA: 3  Anesthesia Plan: General   Post-op Pain Management: Tylenol  PO (pre-op)*, Gabapentin  PO (pre-op)* and Toradol  IV (intra-op)*   Induction: Intravenous  PONV Risk Score and Plan: 3 and Ondansetron , Dexamethasone  and Midazolam   Airway Management Planned: Oral ETT  Additional Equipment: None  Intra-op Plan:   Post-operative Plan: Extubation in OR  Informed Consent: I have reviewed the patients History and Physical, chart, labs and discussed the procedure including the risks,  benefits and alternatives for the proposed anesthesia with the patient or authorized representative who has indicated his/her understanding and acceptance.     Dental advisory given  Plan Discussed with: CRNA and Surgeon  Anesthesia Plan Comments: (Discussed risks of anesthesia with patient, including PONV, sore throat, lip/dental/eye damage. Rare risks discussed as well, such as cardiorespiratory and neurological sequelae, and allergic reactions. Discussed the role of CRNA in patient's perioperative care. Patient understands.)        Anesthesia Quick Evaluation

## 2023-09-03 NOTE — Anesthesia Procedure Notes (Signed)
 Procedure Name: Intubation Date/Time: 09/03/2023 12:53 PM  Performed by: Zulema Leita PARAS, CRNAPre-anesthesia Checklist: Patient identified, Emergency Drugs available, Suction available and Patient being monitored Patient Re-evaluated:Patient Re-evaluated prior to induction Oxygen Delivery Method: Circle system utilized Preoxygenation: Pre-oxygenation with 100% oxygen Induction Type: IV induction Ventilation: Mask ventilation without difficulty Laryngoscope Size: Mac and 4 Grade View: Grade II Tube type: Oral Tube size: 7.0 mm Number of attempts: 1 Airway Equipment and Method: Stylet Placement Confirmation: ETT inserted through vocal cords under direct vision, positive ETCO2 and breath sounds checked- equal and bilateral Secured at: 21 cm Tube secured with: Tape Dental Injury: Teeth and Oropharynx as per pre-operative assessment

## 2023-09-03 NOTE — Anesthesia Postprocedure Evaluation (Signed)
 Anesthesia Post Note  Patient: Jonathan Melendez  Procedure(s) Performed: LAPAROSCOPIC CHOLECYSTECTOMY WITH INTRAOPERATIVE CHOLANGIOGRAM     Patient location during evaluation: PACU Anesthesia Type: General Level of consciousness: awake and alert Pain management: pain level controlled Vital Signs Assessment: post-procedure vital signs reviewed and stable Respiratory status: spontaneous breathing, nonlabored ventilation, respiratory function stable and patient connected to nasal cannula oxygen Cardiovascular status: blood pressure returned to baseline and stable Postop Assessment: no apparent nausea or vomiting Anesthetic complications: no   No notable events documented.  Last Vitals:  Vitals:   09/03/23 1353 09/03/23 1400  BP: (!) 176/98 (!) 156/91  Pulse: 89 85  Resp: 17 15  Temp: 36.5 C   SpO2: 99% 97%    Last Pain:  Vitals:   09/03/23 1406  TempSrc:   PainSc: 5                  Rome Ade

## 2023-09-03 NOTE — Discharge Instructions (Signed)

## 2023-09-04 ENCOUNTER — Encounter (HOSPITAL_COMMUNITY): Payer: Self-pay | Admitting: Surgery

## 2023-09-04 LAB — SURGICAL PATHOLOGY

## 2023-09-30 ENCOUNTER — Encounter (HOSPITAL_BASED_OUTPATIENT_CLINIC_OR_DEPARTMENT_OTHER): Payer: Self-pay | Admitting: Family Medicine

## 2023-09-30 ENCOUNTER — Ambulatory Visit (INDEPENDENT_AMBULATORY_CARE_PROVIDER_SITE_OTHER): Admitting: Family Medicine

## 2023-09-30 VITALS — BP 129/83 | HR 61 | Temp 97.8°F | Resp 18 | Ht 68.0 in | Wt 187.0 lb

## 2023-09-30 DIAGNOSIS — Z794 Long term (current) use of insulin: Secondary | ICD-10-CM | POA: Diagnosis not present

## 2023-09-30 DIAGNOSIS — I10 Essential (primary) hypertension: Secondary | ICD-10-CM | POA: Diagnosis not present

## 2023-09-30 DIAGNOSIS — E1165 Type 2 diabetes mellitus with hyperglycemia: Secondary | ICD-10-CM

## 2023-09-30 LAB — POCT GLYCOSYLATED HEMOGLOBIN (HGB A1C): Hemoglobin A1C: 8.6 % — AB (ref 4.0–5.6)

## 2023-09-30 MED ORDER — TIRZEPATIDE 2.5 MG/0.5ML ~~LOC~~ SOAJ: 2.5000 mg | SUBCUTANEOUS | 1 refills | Status: DC

## 2023-09-30 NOTE — Progress Notes (Signed)
    Procedures performed today:    None.  Independent interpretation of notes and tests performed by another provider:   None.  Brief History, Exam, Impression, and Recommendations:    BP 129/83   Pulse 61   Temp 97.8 F (36.6 C) (Oral)   Resp 18   Ht 5' 8 (1.727 m)   Wt 187 lb (84.8 kg)   SpO2 96%   BMI 28.43 kg/m   Type 2 diabetes mellitus with hyperglycemia, with long-term current use of insulin  [facility]) Assessment & Plan: Patient continues with metformin .  He was able to switch to Toujeo .  Denies any current issues, has been checking fasting blood sugar in the morning and reports that these readings were elevated after recent surgery, however over the past week these have been improved with fasting blood sugars in the 130s.  He does recall 1 episode of low blood sugar, did check blood sugar in the evening and it was around 65, did not have any symptoms at that level.  Reports that he had not eaten much that day and thinks that is what led to hypoglycemic reading. We discussed considerations related to medication management.  He did have recent cholecystectomy and feels that GI symptoms are significantly improved.  We did discuss considerations related to possible initiation of GLP-1 receptor agonist, he would be interested in proceeding with this.  We discussed options and we will proceed with Mounjaro , discussed that we typically start with lowest dose in order to monitor for any potential side effects and improve tolerability.  With starting this new medication, will need to keep a close eye on fasting blood sugars and did discuss indications for gradually reducing insulin  dose pending progress with fasting blood sugar readings. Will plan to follow-up in about 1 month to monitor progress with new medication and discuss possible dose titration.  Orders: -     POCT glycosylated hemoglobin (Hb A1C) -     Tirzepatide ; Inject 2.5 mg into the skin once a week.  Dispense: 2 mL; Refill:  1  Primary hypertension Assessment & Plan: Blood pressure borderline in office today Can continue with current regimen, no changes made today.  Recommend intermittent monitoring of blood pressure at home, DASH diet   Return in about 4 weeks (around 10/28/2023) for diabetes, med check, can be virtual.   ___________________________________________ Knute Mazzuca de [place], MD, ABFM, [facility] Primary Care and Sports Medicine [facility]

## 2023-09-30 NOTE — Assessment & Plan Note (Signed)
 Patient continues with metformin .  He was able to switch to Toujeo .  Denies any current issues, has been checking fasting blood sugar in the morning and reports that these readings were elevated after recent surgery, however over the past week these have been improved with fasting blood sugars in the 130s.  He does recall 1 episode of low blood sugar, did check blood sugar in the evening and it was around 65, did not have any symptoms at that level.  Reports that he had not eaten much that day and thinks that is what led to hypoglycemic reading. We discussed considerations related to medication management.  He did have recent cholecystectomy and feels that GI symptoms are significantly improved.  We did discuss considerations related to possible initiation of GLP-1 receptor agonist, he would be interested in proceeding with this.  We discussed options and we will proceed with Mounjaro , discussed that we typically start with lowest dose in order to monitor for any potential side effects and improve tolerability.  With starting this new medication, will need to keep a close eye on fasting blood sugars and did discuss indications for gradually reducing insulin  dose pending progress with fasting blood sugar readings. Will plan to follow-up in about 1 month to monitor progress with new medication and discuss possible dose titration.

## 2023-09-30 NOTE — Assessment & Plan Note (Signed)
 Blood pressure borderline in office today Can continue with current regimen, no changes made today.  Recommend intermittent monitoring of blood pressure at home, DASH diet

## 2023-11-01 ENCOUNTER — Encounter (HOSPITAL_BASED_OUTPATIENT_CLINIC_OR_DEPARTMENT_OTHER): Payer: Self-pay | Admitting: Family Medicine

## 2023-11-01 ENCOUNTER — Ambulatory Visit: Payer: Self-pay

## 2023-11-01 NOTE — Telephone Encounter (Signed)
 [facility] calling about patient's prescription for Trizepatide. [facility] disconnected prior to being transferred to me. Forwarding to office.      Copied from CRM #[id number]. Topic: Clinical - Prescription Issue >> Nov 01, 2023  3:21 PM Geneva B wrote: Reason for [employer] calling have some questions about pt rx

## 2023-11-04 NOTE — Telephone Encounter (Signed)
 Please see mychart msg for patient

## 2023-11-05 ENCOUNTER — Encounter (HOSPITAL_BASED_OUTPATIENT_CLINIC_OR_DEPARTMENT_OTHER): Payer: Self-pay | Admitting: Family Medicine

## 2023-11-05 ENCOUNTER — Telehealth (HOSPITAL_BASED_OUTPATIENT_CLINIC_OR_DEPARTMENT_OTHER): Admitting: Family Medicine

## 2023-11-05 DIAGNOSIS — E1165 Type 2 diabetes mellitus with hyperglycemia: Secondary | ICD-10-CM | POA: Diagnosis not present

## 2023-11-05 DIAGNOSIS — Z794 Long term (current) use of insulin: Secondary | ICD-10-CM

## 2023-11-05 MED ORDER — TIRZEPATIDE 5 MG/0.5ML ~~LOC~~ SOAJ: 5.0000 mg | SUBCUTANEOUS | 2 refills | Status: DC

## 2023-11-05 NOTE — Assessment & Plan Note (Signed)
 Patient continues with metformin  and Mounjaro , was able to start 2.5 mg dose.  Reports that he was doing well with Mounjaro  and denies any significant side effects or issues.  He has administered 4 doses, last administered about 10 days ago.  With starting Mounjaro , he has stopped long-acting insulin  and reports that his fasting blood sugars have been in the 120s primarily. We discussed considerations, can continue with metformin .  We can also look to increase dose of Mounjaro  to 5 mg.  Given timing of when his last dose was administered, advised that he would need to administer next dose today or tomorrow, if not then would need to wait until next dose would be due. Will plan to follow-up in about 2 months to assess progress.  Did discuss that if he is doing well with 5 mg dose, he can reach out to us  after 1 month and we can titrate to 7.5 mg dose.  If having some side effects and wanting to allow for further adjustment to new dose, can continue with 5 mg, prescription does include refill if this is the case

## 2023-11-05 NOTE — Progress Notes (Signed)
   Virtual Visit   I connected with  Jonathan Melendez  on 11/05/23 by telehealth and verified that I am speaking with the correct person using two identifiers. Visit completed via video.  I discussed the limitations, risks, security and privacy concerns of performing an evaluation and management service by telephone, including the higher likelihood of inaccurate diagnosis and treatment, and the availability of in person appointments.  We also discussed the likely need of an additional face to face encounter for complete and high quality delivery of care.  I also discussed with the patient that there may be a patient responsible charge related to this service. The patient expressed understanding and wishes to proceed.  Provider location is in medical facility. Patient location is at their home, different from provider location. People involved in care of the patient during this telehealth encounter were myself, my nurse/medical assistant, and my front office/scheduling team member.  Review of Systems: No fevers, chills, night sweats, weight loss, chest pain, or shortness of breath.   Objective Findings:    General: Speaking full sentences, no audible heavy breathing.  Sounds alert and appropriately interactive.  Independent interpretation of tests performed by another provider:   None.  Brief History, Exam, Impression, and Recommendations:    Diabetes mellitus (HCC) Patient continues with metformin  and Mounjaro , was able to start 2.5 mg dose.  Reports that he was doing well with Mounjaro  and denies any significant side effects or issues.  He has administered 4 doses, last administered about 10 days ago.  With starting Mounjaro , he has stopped long-acting insulin  and reports that his fasting blood sugars have been in the 120s primarily. We discussed considerations, can continue with metformin .  We can also look to increase dose of Mounjaro  to 5 mg.  Given timing of when his last dose was  administered, advised that he would need to administer next dose today or tomorrow, if not then would need to wait until next dose would be due. Will plan to follow-up in about 2 months to assess progress.  Did discuss that if he is doing well with 5 mg dose, he can reach out to us  after 1 month and we can titrate to 7.5 mg dose.  If having some side effects and wanting to allow for further adjustment to new dose, can continue with 5 mg, prescription does include refill if this is the case  I discussed the above assessment and treatment plan with the patient. The patient was provided an opportunity to ask questions and all were answered. The patient agreed with the plan and demonstrated an understanding of the instructions.   The patient was advised to call back or seek an in-person evaluation if the symptoms worsen or if the condition fails to improve as anticipated.   I provided 12 minutes of face to face and non-face-to-face time during this encounter date, time was needed to gather information, review chart, records, communicate/coordinate with staff remotely, as well as complete documentation.   ___________________________________________ Karmel Patricelli de [place], MD, ABFM, [facility] Primary Care and Sports Medicine [facility]

## 2023-12-10 ENCOUNTER — Encounter (HOSPITAL_BASED_OUTPATIENT_CLINIC_OR_DEPARTMENT_OTHER): Payer: Self-pay | Admitting: Family Medicine

## 2023-12-10 NOTE — Telephone Encounter (Signed)
 Please see mychart message sent by pt and advise.

## 2024-01-07 ENCOUNTER — Other Ambulatory Visit (HOSPITAL_BASED_OUTPATIENT_CLINIC_OR_DEPARTMENT_OTHER): Payer: Self-pay

## 2024-01-07 ENCOUNTER — Ambulatory Visit (INDEPENDENT_AMBULATORY_CARE_PROVIDER_SITE_OTHER): Admitting: Family Medicine

## 2024-01-07 ENCOUNTER — Encounter (HOSPITAL_BASED_OUTPATIENT_CLINIC_OR_DEPARTMENT_OTHER): Payer: Self-pay | Admitting: Family Medicine

## 2024-01-07 VITALS — BP 140/94 | HR 61 | Ht 68.0 in | Wt 172.7 lb

## 2024-01-07 DIAGNOSIS — E1165 Type 2 diabetes mellitus with hyperglycemia: Secondary | ICD-10-CM

## 2024-01-07 DIAGNOSIS — I10 Essential (primary) hypertension: Secondary | ICD-10-CM | POA: Diagnosis not present

## 2024-01-07 DIAGNOSIS — Z794 Long term (current) use of insulin: Secondary | ICD-10-CM

## 2024-01-07 DIAGNOSIS — Z23 Encounter for immunization: Secondary | ICD-10-CM

## 2024-01-07 LAB — POCT GLYCOSYLATED HEMOGLOBIN (HGB A1C): Hemoglobin A1C: 5.9 % — AB (ref 4.0–5.6)

## 2024-01-07 MED ORDER — AMLODIPINE BESYLATE 5 MG PO TABS
5.0000 mg | ORAL_TABLET | Freq: Every day | ORAL | 1 refills | Status: AC
  Filled 2024-01-07: qty 30, 30d supply, fill #0
  Filled 2024-02-06 – 2024-02-23 (×2): qty 30, 30d supply, fill #1
  Filled 2024-03-17: qty 30, 30d supply, fill #2

## 2024-01-07 MED ORDER — TIRZEPATIDE 7.5 MG/0.5ML ~~LOC~~ SOAJ
7.5000 mg | SUBCUTANEOUS | 1 refills | Status: AC
  Filled 2024-01-07: qty 2, 28d supply, fill #0
  Filled 2024-02-03: qty 2, 28d supply, fill #1
  Filled 2024-03-17: qty 2, 28d supply, fill #2

## 2024-01-07 MED ORDER — FREESTYLE LIBRE 2 PLUS SENSOR MISC
3 refills | Status: DC
  Filled 2024-01-07: qty 2, 28d supply, fill #0
  Filled 2024-01-10: qty 2, 30d supply, fill #0

## 2024-01-07 NOTE — Progress Notes (Signed)
    Procedures performed today:    None.  Independent interpretation of notes and tests performed by another provider:   None.  Brief History, Exam, Impression, and Recommendations:    BP (!) 140/94   Pulse 61   Ht 5' 8 (1.727 m)   Wt 172 lb 11.2 oz (78.3 kg)   SpO2 99%   BMI 26.26 kg/m   Type 2 diabetes mellitus with hyperglycemia, with long-term current use of insulin  (HCC) Assessment & Plan: Patient continues with Mounjaro  5 mg.  He has also been taking metformin .  He did decrease dose of metformin  partly due to occasional low blood sugar readings overnight.  He said generally been occurring infrequently.  When he did start with 5 mg dose of Mounjaro , he noted moderate side effects for about 4 days following the injection.  As he has continued with medication, issues with side effects has lessened.  He does experiencing some side effects for about a day.  He does indicate that for insurance to continue covering medication, he will need to increase to 7.5 mg dose. Point-of-care A1c in office today is notably improved at 5.9% We discussed options and we will increase dose of Mounjaro  to 7.5 mg, cautioned on potential side effects.  Discussed that if he is noticing significant side effects with increasing dose, then likely we will need to consider switching to alternative GLP-1 such as Ozempic or Trulicity. Advised on monitoring blood sugar closely given occasional low blood sugar readings.  Could adjust timing of metformin  with administering this in the morning only. Foot exam completed in office today, documented in chart We will plan to follow-up in about 3 months or sooner as needed  Orders: -     FreeStyle Libre 2 Plus Sensor; Change sensor every 15 days.  Dispense: 6 each; Refill: 3 -     Tirzepatide ; Inject 7.5 mg into the skin once a week.  Dispense: 6 mL; Refill: 1 -     POCT glycosylated hemoglobin (Hb A1C)  Primary hypertension Assessment & Plan: Blood pressure  borderline in office today Can continue with current regimen, no changes made today.  Recommend intermittent monitoring of blood pressure at home, DASH diet   Encounter for immunization -     Flu vaccine trivalent PF, 6mos and older(Flulaval,Afluria,Fluarix,Fluzone)  Other orders -     amLODIPine  Besylate; Take 1 tablet (5 mg total) by mouth daily.  Dispense: 90 tablet; Refill: 1  Return in about 3 months (around 04/08/2024) for diabetes, hypertension.   ___________________________________________ Jonathan Daigler de [place], MD, ABFM, CAQSM [facility] and [facility]

## 2024-01-07 NOTE — Assessment & Plan Note (Signed)
 Blood pressure borderline in office today Can continue with current regimen, no changes made today.  Recommend intermittent monitoring of blood pressure at home, DASH diet

## 2024-01-07 NOTE — Patient Instructions (Signed)
  Medication Instructions:  Your physician recommends that you continue on your current medications as directed. Please refer to the Current Medication list given to you today. --If you need a refill on any your medications before your next appointment, please call your pharmacy first. If no refills are authorized on file call the office.-- Lab Work: Your physician has recommended that you have lab work today: A1c check If you have labs (blood work) drawn today and your tests are completely normal, you will receive your results via MyChart message OR a phone call from our staff.  Please ensure you check your voicemail in the event that you authorized detailed messages to be left on a delegated number. If you have any lab test that is abnormal or we need to change your treatment, we will call you to review the results.  Referrals/Procedures/Imaging: none   Follow-Up: Your next appointment:   Your physician recommends that you schedule a follow-up appointment in: 3 months with Dr. de Cuba  You will receive a text message or e-mail with a link to a survey about your care and experience with us  today! We would greatly appreciate your feedback!   Thanks for letting us  be apart of your health journey!!  Primary Care and Sports Medicine   Dr. Quintin sheerer [place]   We encourage you to activate your patient portal called MyChart.  Sign up information is provided on this After Visit Summary.  MyChart is used to connect with patients for Virtual Visits (Telemedicine).  Patients are able to view lab/test results, encounter notes, upcoming appointments, etc.  Non-urgent messages can be sent to your provider as well. To learn more about what you can do with MyChart, please visit --  [email or web address].

## 2024-01-07 NOTE — Assessment & Plan Note (Signed)
 Patient continues with Mounjaro  5 mg.  He has also been taking metformin .  He did decrease dose of metformin  partly due to occasional low blood sugar readings overnight.  He said generally been occurring infrequently.  When he did start with 5 mg dose of Mounjaro , he noted moderate side effects for about 4 days following the injection.  As he has continued with medication, issues with side effects has lessened.  He does experiencing some side effects for about a day.  He does indicate that for insurance to continue covering medication, he will need to increase to 7.5 mg dose. Point-of-care A1c in office today is notably improved at 5.9% We discussed options and we will increase dose of Mounjaro  to 7.5 mg, cautioned on potential side effects.  Discussed that if he is noticing significant side effects with increasing dose, then likely we will need to consider switching to alternative GLP-1 such as Ozempic or Trulicity. Advised on monitoring blood sugar closely given occasional low blood sugar readings.  Could adjust timing of metformin  with administering this in the morning only. Foot exam completed in office today, documented in chart We will plan to follow-up in about 3 months or sooner as needed

## 2024-01-10 ENCOUNTER — Other Ambulatory Visit (HOSPITAL_BASED_OUTPATIENT_CLINIC_OR_DEPARTMENT_OTHER): Payer: Self-pay

## 2024-01-10 ENCOUNTER — Encounter (HOSPITAL_BASED_OUTPATIENT_CLINIC_OR_DEPARTMENT_OTHER): Payer: Self-pay | Admitting: Family Medicine

## 2024-01-10 ENCOUNTER — Other Ambulatory Visit (HOSPITAL_BASED_OUTPATIENT_CLINIC_OR_DEPARTMENT_OTHER): Payer: Self-pay | Admitting: Family Medicine

## 2024-01-10 DIAGNOSIS — E1165 Type 2 diabetes mellitus with hyperglycemia: Secondary | ICD-10-CM

## 2024-01-10 DIAGNOSIS — Z794 Long term (current) use of insulin: Secondary | ICD-10-CM

## 2024-01-13 ENCOUNTER — Other Ambulatory Visit (HOSPITAL_BASED_OUTPATIENT_CLINIC_OR_DEPARTMENT_OTHER): Payer: Self-pay

## 2024-01-13 MED ORDER — FREESTYLE LIBRE 2 PLUS SENSOR MISC
3 refills | Status: DC
  Filled 2024-01-13: qty 2, 28d supply, fill #0

## 2024-01-13 MED ORDER — METOPROLOL SUCCINATE ER 50 MG PO TB24
50.0000 mg | ORAL_TABLET | Freq: Every day | ORAL | 1 refills | Status: AC
  Filled 2024-01-13: qty 30, 30d supply, fill #0
  Filled 2024-02-23: qty 30, 30d supply, fill #1
  Filled 2024-03-17: qty 30, 30d supply, fill #2

## 2024-01-13 MED ORDER — ATORVASTATIN CALCIUM 20 MG PO TABS
20.0000 mg | ORAL_TABLET | Freq: Every day | ORAL | 1 refills | Status: AC
  Filled 2024-01-13 – 2024-02-03 (×4): qty 30, 30d supply, fill #0
  Filled 2024-03-17: qty 30, 30d supply, fill #1

## 2024-01-13 NOTE — Addendum Note (Signed)
 Addended by: RONNETTE DAMIEN SQUIBB on: 01/13/2024 09:28 AM   Modules accepted: Orders

## 2024-01-13 NOTE — Telephone Encounter (Signed)
 Checked with pharmacy staff and per Luke Cork, Dexcom is what is preferred.

## 2024-01-14 ENCOUNTER — Other Ambulatory Visit (HOSPITAL_COMMUNITY): Payer: Self-pay

## 2024-01-14 ENCOUNTER — Other Ambulatory Visit (HOSPITAL_BASED_OUTPATIENT_CLINIC_OR_DEPARTMENT_OTHER): Payer: Self-pay

## 2024-01-14 MED ORDER — DEXCOM G6 SENSOR MISC
1.0000 | Freq: Every day | 3 refills | Status: AC
  Filled 2024-01-14: qty 3, 30d supply, fill #0
  Filled 2024-02-23: qty 3, 30d supply, fill #1
  Filled 2024-03-25: qty 3, 30d supply, fill #2

## 2024-01-14 MED ORDER — DEXCOM G6 TRANSMITTER MISC
1.0000 | Freq: Every day | 3 refills | Status: AC
  Filled 2024-01-14: qty 1, 30d supply, fill #0

## 2024-01-14 MED ORDER — DEXCOM G6 RECEIVER DEVI
1.0000 | Freq: Every day | 0 refills | Status: DC
  Filled 2024-01-14: qty 1, 30d supply, fill #0

## 2024-01-14 NOTE — Addendum Note (Signed)
 Addended by: DE CUBA, QUINTIN J on: 01/14/2024 12:04 AM   Modules accepted: Orders

## 2024-01-15 ENCOUNTER — Telehealth (HOSPITAL_BASED_OUTPATIENT_CLINIC_OR_DEPARTMENT_OTHER): Payer: Self-pay | Admitting: Pharmacy Technician

## 2024-01-15 ENCOUNTER — Other Ambulatory Visit (HOSPITAL_COMMUNITY): Payer: Self-pay

## 2024-01-15 ENCOUNTER — Other Ambulatory Visit (HOSPITAL_BASED_OUTPATIENT_CLINIC_OR_DEPARTMENT_OTHER): Payer: Self-pay

## 2024-01-15 NOTE — Telephone Encounter (Signed)
 Pharmacy Patient Advocate Encounter   Received notification from Onbase that prior authorization for Dexcom G6 Transmitter is required/requested.   Insurance verification completed.   The patient is insured through [employer] .   Per test claim: The current 30 day co-pay is, $0.00.  No PA needed at this time. This test claim was processed through [facility]- copay amounts may vary at other pharmacies due to pharmacy/plan contracts, or as the patient moves through the different stages of their insurance plan.

## 2024-01-15 NOTE — Telephone Encounter (Signed)
 Pharmacy Patient Advocate Encounter   Received notification from Onbase that prior authorization for Dexcom G6 Receiver  device is required/requested.   Insurance verification completed.   The patient is insured through [employer] .   Per test claim: The current 30 day co-pay is, $0.00.  No PA needed at this time. This test claim was processed through [facility]- copay amounts may vary at other pharmacies due to pharmacy/plan contracts, or as the patient moves through the different stages of their insurance plan.

## 2024-01-15 NOTE — Telephone Encounter (Signed)
 Pharmacy Patient Advocate Encounter   Received notification from Onbase that prior authorization for Dexcom G6 Sensor is required/requested.   Insurance verification completed.   The patient is insured through [employer] .   Per test claim: PA required; PA submitted to above mentioned insurance via Latent Key/confirmation #/EOC [facility] Status is pending

## 2024-01-15 NOTE — Telephone Encounter (Signed)
 Pharmacy Patient Advocate Encounter  Received notification from [employer] [place]  that Prior Authorization for Dexcom G6 Sensor has been APPROVED from 12/16/2023 to 01/14/2025. Ran test claim, Copay is $0.00. This test claim was processed through [facility]- copay amounts may vary at other pharmacies due to pharmacy/plan contracts, or as the patient moves through the different stages of their insurance plan.   PA #/Case ID/Reference #: [id number]

## 2024-01-20 DIAGNOSIS — H524 Presbyopia: Secondary | ICD-10-CM | POA: Diagnosis not present

## 2024-01-20 DIAGNOSIS — H35033 Hypertensive retinopathy, bilateral: Secondary | ICD-10-CM | POA: Diagnosis not present

## 2024-01-20 DIAGNOSIS — E119 Type 2 diabetes mellitus without complications: Secondary | ICD-10-CM | POA: Diagnosis not present

## 2024-01-30 ENCOUNTER — Other Ambulatory Visit (HOSPITAL_BASED_OUTPATIENT_CLINIC_OR_DEPARTMENT_OTHER): Payer: Self-pay

## 2024-02-03 ENCOUNTER — Other Ambulatory Visit (HOSPITAL_BASED_OUTPATIENT_CLINIC_OR_DEPARTMENT_OTHER): Payer: Self-pay

## 2024-02-06 ENCOUNTER — Encounter (HOSPITAL_BASED_OUTPATIENT_CLINIC_OR_DEPARTMENT_OTHER): Payer: Self-pay | Admitting: Family Medicine

## 2024-02-18 ENCOUNTER — Other Ambulatory Visit (HOSPITAL_BASED_OUTPATIENT_CLINIC_OR_DEPARTMENT_OTHER): Payer: Self-pay

## 2024-02-23 ENCOUNTER — Other Ambulatory Visit (HOSPITAL_BASED_OUTPATIENT_CLINIC_OR_DEPARTMENT_OTHER): Payer: Self-pay | Admitting: Family Medicine

## 2024-02-24 ENCOUNTER — Other Ambulatory Visit (HOSPITAL_BASED_OUTPATIENT_CLINIC_OR_DEPARTMENT_OTHER): Payer: Self-pay

## 2024-02-24 MED ORDER — DEXCOM G6 RECEIVER DEVI
1.0000 | Freq: Every day | 0 refills | Status: AC
  Filled 2024-02-24: qty 1, 30d supply, fill #0

## 2024-02-27 ENCOUNTER — Other Ambulatory Visit (HOSPITAL_BASED_OUTPATIENT_CLINIC_OR_DEPARTMENT_OTHER): Payer: Self-pay

## 2024-03-25 ENCOUNTER — Other Ambulatory Visit (HOSPITAL_BASED_OUTPATIENT_CLINIC_OR_DEPARTMENT_OTHER): Payer: Self-pay

## 2024-04-08 ENCOUNTER — Ambulatory Visit (HOSPITAL_BASED_OUTPATIENT_CLINIC_OR_DEPARTMENT_OTHER): Admitting: Family Medicine
